# Patient Record
Sex: Female | Born: 1965 | Race: White | Hispanic: No | Marital: Married | Smoking: Current some day smoker
Health system: Southern US, Community
[De-identification: ages and names within clinical notes are randomized; demographics above are authoritative.]

## PROBLEM LIST (undated history)

## (undated) DIAGNOSIS — E785 Hyperlipidemia, unspecified: Secondary | ICD-10-CM

## (undated) DIAGNOSIS — I10 Essential (primary) hypertension: Secondary | ICD-10-CM

## (undated) DIAGNOSIS — K219 Gastro-esophageal reflux disease without esophagitis: Secondary | ICD-10-CM

## (undated) HISTORY — PX: DENTAL SURGERY: SHX609

## (undated) HISTORY — DX: Gastro-esophageal reflux disease without esophagitis: K21.9

## (undated) HISTORY — DX: Hyperlipidemia, unspecified: E78.5

## (undated) HISTORY — DX: Essential (primary) hypertension: I10

---

## 1997-08-16 ENCOUNTER — Encounter: Admission: RE | Admit: 1997-08-16 | Discharge: 1997-09-27 | Payer: Self-pay | Admitting: Obstetrics and Gynecology

## 1999-07-11 ENCOUNTER — Other Ambulatory Visit: Admission: RE | Admit: 1999-07-11 | Discharge: 1999-07-11 | Payer: Self-pay | Admitting: Obstetrics & Gynecology

## 2003-07-03 ENCOUNTER — Emergency Department (HOSPITAL_COMMUNITY): Admission: EM | Admit: 2003-07-03 | Discharge: 2003-07-03 | Payer: Self-pay

## 2005-05-14 ENCOUNTER — Ambulatory Visit: Payer: Self-pay | Admitting: Family Medicine

## 2005-05-14 ENCOUNTER — Other Ambulatory Visit: Admission: RE | Admit: 2005-05-14 | Discharge: 2005-05-14 | Payer: Self-pay | Admitting: Family Medicine

## 2014-12-16 ENCOUNTER — Encounter (INDEPENDENT_AMBULATORY_CARE_PROVIDER_SITE_OTHER): Payer: Self-pay

## 2014-12-16 ENCOUNTER — Ambulatory Visit (INDEPENDENT_AMBULATORY_CARE_PROVIDER_SITE_OTHER): Payer: BLUE CROSS/BLUE SHIELD | Admitting: Primary Care

## 2014-12-16 ENCOUNTER — Encounter: Payer: Self-pay | Admitting: Primary Care

## 2014-12-16 VITALS — BP 146/94 | HR 106 | Temp 98.3°F | Ht 65.75 in | Wt 161.1 lb

## 2014-12-16 DIAGNOSIS — I10 Essential (primary) hypertension: Secondary | ICD-10-CM

## 2014-12-16 DIAGNOSIS — Z720 Tobacco use: Secondary | ICD-10-CM

## 2014-12-16 MED ORDER — VARENICLINE TARTRATE 1 MG PO TABS: 1.0000 mg | ORAL_TABLET | Freq: Two times a day (BID) | ORAL | Status: DC

## 2014-12-16 MED ORDER — HYDROCHLOROTHIAZIDE 12.5 MG PO TABS: 12.5000 mg | ORAL_TABLET | Freq: Every day | ORAL | Status: DC

## 2014-12-16 MED ORDER — VARENICLINE TARTRATE 0.5 MG X 11 & 1 MG X 42 PO MISC: ORAL | Status: DC

## 2014-12-16 NOTE — Assessment & Plan Note (Signed)
Smoked 1/2-1 PPD x 26 years. She is ready to quit and is interested in Chantix. She's tried the gum and patches without success. Start Chantix today. Provided starting and continuing pack.  Precautions provided to patient regarding side effects. Follow up in 2 weeks.

## 2014-12-16 NOTE — Patient Instructions (Signed)
Start Chantix Starting Pack first. Follow package directions. I have also provided you with a prescription for the continuing pack.  Start Hydrochlorothiazide tablets for blood pressure. Take 1 tablet by mouth daily. Check your blood pressure when possible and notify me if it is consistently above 140/90.  Follow up in 2 weeks for re-evaluation of your blood pressure.  Please schedule a physical with me in the next 1-2 months. You will also schedule a lab only appointment one week prior. We will discuss your lab results during your physical.  It was a pleasure to meet you today! Please don't hesitate to call me with any questions. Welcome to [facility]!   Hypertension Hypertension, commonly called high blood pressure, is when the force of blood pumping through your arteries is too strong. Your arteries are the blood vessels that carry blood from your heart throughout your body. A blood pressure reading consists of a higher number over a lower number, such as 110/72. The higher number (systolic) is the pressure inside your arteries when your heart pumps. The lower number (diastolic) is the pressure inside your arteries when your heart relaxes. Ideally you want your blood pressure below 120/80. Hypertension forces your heart to work harder to pump blood. Your arteries may become narrow or stiff. Having hypertension puts you at risk for heart disease, stroke, and other problems.  RISK FACTORS Some risk factors for high blood pressure are controllable. Others are not.  Risk factors you cannot control include:   Race. You may be at higher risk if you are African American.  Age. Risk increases with age.  Gender. Men are at higher risk than women before age 88 years. After age 33, women are at higher risk than men. Risk factors you can control include:  Not getting enough exercise or physical activity.  Being overweight.  Getting too much fat, sugar, calories, or salt in your diet.  Drinking  too much alcohol. SIGNS AND SYMPTOMS Hypertension does not usually cause signs or symptoms. Extremely high blood pressure (hypertensive crisis) may cause headache, anxiety, shortness of breath, and nosebleed. DIAGNOSIS  To check if you have hypertension, your health care provider will measure your blood pressure while you are seated, with your arm held at the level of your heart. It should be measured at least twice using the same arm. Certain conditions can cause a difference in blood pressure between your right and left arms. A blood pressure reading that is higher than normal on one occasion does not mean that you need treatment. If one blood pressure reading is high, ask your health care provider about having it checked again. TREATMENT  Treating high blood pressure includes making lifestyle changes and possibly taking medicine. Living a healthy lifestyle can help lower high blood pressure. You may need to change some of your habits. Lifestyle changes may include:  Following the DASH diet. This diet is high in fruits, vegetables, and whole grains. It is low in salt, red meat, and added sugars.  Getting at least 2 hours of brisk physical activity every week.  Losing weight if necessary.  Not smoking.  Limiting alcoholic beverages.  Learning ways to reduce stress. If lifestyle changes are not enough to get your blood pressure under control, your health care provider may prescribe medicine. You may need to take more than one. Work closely with your health care provider to understand the risks and benefits. HOME CARE INSTRUCTIONS  Have your blood pressure rechecked as directed by your health care provider.  Take medicines only as directed by your health care provider. Follow the directions carefully. Blood pressure medicines must be taken as prescribed. The medicine does not work as well when you skip doses. Skipping doses also puts you at risk for problems.   Do not smoke.    Monitor your blood pressure at home as directed by your health care provider. SEEK MEDICAL CARE IF:   You think you are having a reaction to medicines taken.  You have recurrent headaches or feel dizzy.  You have swelling in your ankles.  You have trouble with your vision. SEEK IMMEDIATE MEDICAL CARE IF:  You develop a severe headache or confusion.  You have unusual weakness, numbness, or feel faint.  You have severe chest or abdominal pain.  You vomit repeatedly.  You have trouble breathing. MAKE SURE YOU:   Understand these instructions.  Will watch your condition.  Will get help right away if you are not doing well or get worse. Document Released: 07/02/2005 Document Revised: 11/16/2013 Document Reviewed: 04/24/2013 [facility] Patient Information 2015 [place], [place]. This information is not intended to replace advice given to you by your health care provider. Make sure you discuss any questions you have with your health care provider.

## 2014-12-16 NOTE — Assessment & Plan Note (Signed)
Numerous elevated readings at [facility] and various drug stores. She is worried and would like to be placed on medication. Due to historical elevated readings will start medication. Start HCTZ 12.5 mg daily. Discussed the importance of healthy diet and exercise. Follow up in 2 weeks.

## 2014-12-16 NOTE — Progress Notes (Signed)
   Subjective:    Patient ID: Maureen Orozco, female    DOB: 28-Dec-1965, 49 y.o.   MRN: [id number]  HPI  Maureen Orozco is a 49 year old female who presents today to establish care and discuss the problems mentioned below. She has not seen a PCP in over 10 years.  1) Elevated blood pressure readings: Elevated in clinic today, reports elevated readings in the past and last one being 2 weeks ago which was 150's/90's at [facility]. She reports occasional headaches. Denies chest pain, dizziness.  2) Hyperlipidemia: Diagnosed several years ago. She has not been on medication before and tries to manage with a healthy diet. Her cholesterol has not been checked in over 10 years. Diet consists of sandwiches, cereal, meats, vegetables, fruit. She drinks sodas, some sweet tea, rarely water.   3) Tobacco abuse: Smokes cigarettes since age 6. She currently smokes 1/2 PPD. She's tried quitting in the past with the nicotine patches and gum without success. She is ready to quit and is interested in medication.   Review of Systems  Constitutional: Negative for fatigue and unexpected weight change.  HENT: Negative for rhinorrhea.   Respiratory: Negative for cough and shortness of breath.   Cardiovascular: Negative for chest pain.  Gastrointestinal: Negative for diarrhea and constipation.  Genitourinary: Negative for dysuria and frequency.  Musculoskeletal: Negative for myalgias and arthralgias.  Skin: Negative for rash.  Neurological: Negative for dizziness.       Occasional headaches  Psychiatric/Behavioral:       Denies concerns for anxiety and depression       Past Medical History  Diagnosis Date  . Hyperlipidemia     History   Social History  . Marital Status: Married    Spouse Name: N/A  . Number of Children: N/A  . Years of Education: N/A   Occupational History  . Not on file.   Social History Main Topics  . Smoking status: Current Some Day Smoker -- 0.50 packs/day    Types:  Cigarettes  . Smokeless tobacco: Not on file  . Alcohol Use: No  . Drug Use: Not on file  . Sexual Activity: Not on file   Other Topics Concern  . Not on file   Social History Narrative   Married.   Works at [employer] in [place].   Has one child.   Enjoys playing computer games, watching TV.    History reviewed. No pertinent past surgical history.  Family History  Problem Relation Age of Onset  . Adopted: Yes    No Known Allergies  No current outpatient prescriptions on file prior to visit.   No current facility-administered medications on file prior to visit.    BP 146/94 mmHg  Pulse 106  Temp(Src) 98.3 F (36.8 C) (Oral)  Ht 5' 5.75" (1.67 m)  Wt 161 lb 1.9 oz (73.084 kg)  BMI 26.21 kg/m2  SpO2 98%    Objective:   Physical Exam  Constitutional: She is oriented to person, place, and time. She appears well-nourished.  HENT:  Head: Normocephalic.  Cardiovascular: Normal rate and regular rhythm.   Pulmonary/Chest: Effort normal and breath sounds normal.  Musculoskeletal: Normal range of motion.  Neurological: She is alert and oriented to person, place, and time.  Skin: Skin is warm and dry.  Psychiatric: She has a normal mood and affect.          Assessment & Plan:

## 2014-12-16 NOTE — Progress Notes (Signed)
Pre visit review using our clinic review tool, if applicable. No additional management support is needed unless otherwise documented below in the visit note. 

## 2015-01-04 ENCOUNTER — Ambulatory Visit (INDEPENDENT_AMBULATORY_CARE_PROVIDER_SITE_OTHER): Payer: BLUE CROSS/BLUE SHIELD | Admitting: Primary Care

## 2015-01-04 ENCOUNTER — Encounter: Payer: Self-pay | Admitting: Primary Care

## 2015-01-04 VITALS — BP 138/84 | HR 119 | Temp 98.6°F | Ht 66.0 in | Wt 169.0 lb

## 2015-01-04 DIAGNOSIS — I10 Essential (primary) hypertension: Secondary | ICD-10-CM | POA: Diagnosis not present

## 2015-01-04 DIAGNOSIS — Z720 Tobacco use: Secondary | ICD-10-CM | POA: Diagnosis not present

## 2015-01-04 MED ORDER — BUPROPION HCL ER (SR) 150 MG PO TB12: 150.0000 mg | ORAL_TABLET | Freq: Two times a day (BID) | ORAL | Status: DC

## 2015-01-04 NOTE — Assessment & Plan Note (Signed)
Chantix too expensive, although she is still motivated to quit. RX for Bupropion SR tablets. 1 daily x 3 days then advance to 1 BID. Discussed importance of choosing a quit date one week after starting medication. Follow up in 4 weeks.

## 2015-01-04 NOTE — Patient Instructions (Signed)
Continue hydrochlorothiazide tablets. Continue checking your blood pressure at home.  Start Bupropion SR tablets. Take 1 tablet by mouth daily for 3 days, then take 1 tablet by mouth twice daily. You must choose a quit date one week before starting medication. At that point you will need to throw away all cigarettes.  Follow up in 4 weeks for re-evaluation. It was very nice to see you!

## 2015-01-04 NOTE — Progress Notes (Signed)
Pre visit review using our clinic review tool, if applicable. No additional management support is needed unless otherwise documented below in the visit note. 

## 2015-01-04 NOTE — Assessment & Plan Note (Signed)
Improved on HCTZ.  Home readings are 120-130/80. Stable today, continue HCTZ.

## 2015-01-04 NOTE — Progress Notes (Signed)
Subjective:    Patient ID: Maureen Orozco, female    DOB: [birth year], 49 y.o.   MRN: [id number]  HPI  Maureen Orozco is a 49 year old female who presents today for follow up.  1) Hypertension: She was evaluated as a new patient on 6/2 and was initiated on HCTZ for numerous elevated readings of high blood pressure, including the elevated reading in the office. She's been checking her blood pressure at home and has been getting 120-130's/80's on average. Her BP today is stable. Denies chest pain, headaches, dizziness.  2) Tobacco cessation: She was very motivated to quit smoking last visit and was interested in Chantix. Prescriptions were provided for starting and continuing packs. She was unable to afford the medication and continues to be very motivated to quit. She is still smoking cigarettes.  3) Leg pain: Present to left leg for the past 2 weeks. She denies recent injury but believes she's pulled a muscle. Denies numbness/tingling. She's tried taking ibuprofen and tylenol without help.   Review of Systems  Respiratory: Negative for shortness of breath.   Cardiovascular: Negative for chest pain.  Musculoskeletal: Positive for myalgias.  Neurological: Negative for dizziness and headaches.       Past Medical History  Diagnosis Date  . Hyperlipidemia     History   Social History  . Marital Status: Married    Spouse Name: N/A  . Number of Children: N/A  . Years of Education: N/A   Occupational History  . Not on file.   Social History Main Topics  . Smoking status: Current Some Day Smoker -- 0.50 packs/day    Types: Cigarettes  . Smokeless tobacco: Not on file  . Alcohol Use: No  . Drug Use: Not on file  . Sexual Activity: Not on file   Other Topics Concern  . Not on file   Social History Narrative   Married.   Works at [employer] in [place].   Has one child.   Enjoys playing computer games, watching TV.    No past surgical history on file.  Family History    Problem Relation Age of Onset  . Adopted: Yes    No Known Allergies  Current Outpatient Prescriptions on File Prior to Visit  Medication Sig Dispense Refill  . hydrochlorothiazide (HYDRODIURIL) 12.5 MG tablet Take 1 tablet (12.5 mg total) by mouth daily. 30 tablet 2  . varenicline (CHANTIX CONTINUING MONTH PAK) 1 MG tablet Take 1 tablet (1 mg total) by mouth 2 (two) times daily. (Patient not taking: Reported on 01/04/2015) 60 tablet 0  . varenicline (CHANTIX STARTING MONTH PAK) 0.5 MG X 11 & 1 MG X 42 tablet Take one 0.5 mg tablet by mouth once daily for 3 days, then increase to one 0.5 mg tablet twice daily for 4 days, then increase to one 1 mg tablet twice daily. (Patient not taking: Reported on 01/04/2015) 53 tablet 0   No current facility-administered medications on file prior to visit.    BP 138/84 mmHg  Pulse 119  Temp(Src) 98.6 F (37 C) (Oral)  Ht 5\' 6"  (1.676 m)  Wt 169 lb (76.658 kg)  BMI 27.29 kg/m2  SpO2 97%    Objective:   Physical Exam  Constitutional: She appears well-nourished.  Cardiovascular: Normal rate and regular rhythm.   Pulmonary/Chest: Effort normal and breath sounds normal.  Musculoskeletal: Normal range of motion. She exhibits tenderness.  Skin: Skin is warm and dry.  Assessment & Plan:

## 2015-01-28 ENCOUNTER — Other Ambulatory Visit: Payer: Self-pay | Admitting: Internal Medicine

## 2015-01-28 DIAGNOSIS — Z0000 Encounter for general adult medical examination without abnormal findings: Secondary | ICD-10-CM

## 2015-02-08 ENCOUNTER — Other Ambulatory Visit (INDEPENDENT_AMBULATORY_CARE_PROVIDER_SITE_OTHER): Payer: BLUE CROSS/BLUE SHIELD

## 2015-02-08 DIAGNOSIS — Z0000 Encounter for general adult medical examination without abnormal findings: Secondary | ICD-10-CM

## 2015-02-08 LAB — COMPREHENSIVE METABOLIC PANEL
ALT: 9 U/L (ref 0–35)
AST: 11 U/L (ref 0–37)
Albumin: 4.3 g/dL (ref 3.5–5.2)
Alkaline Phosphatase: 64 U/L (ref 39–117)
BUN: 17 mg/dL (ref 6–23)
CHLORIDE: 105 meq/L (ref 96–112)
CO2: 27 mEq/L (ref 19–32)
Calcium: 9.6 mg/dL (ref 8.4–10.5)
Creatinine, Ser: 0.76 mg/dL (ref 0.40–1.20)
GFR: 86.77 mL/min (ref >60.00)
GLUCOSE: 94 mg/dL (ref 70–99)
Potassium: 3.7 mEq/L (ref 3.5–5.1)
Sodium: 140 mEq/L (ref 135–145)
TOTAL PROTEIN: 7.2 g/dL (ref 6.0–8.3)
Total Bilirubin: 0.5 mg/dL (ref 0.2–1.2)

## 2015-02-08 LAB — LIPID PANEL
Cholesterol: 262 mg/dL — ABNORMAL HIGH (ref 0–200)
HDL: 53.7 mg/dL (ref >39.00)
LDL Cholesterol: 186 mg/dL — ABNORMAL HIGH (ref 0–99)
NonHDL: 208.3
Total CHOL/HDL Ratio: 5
Triglycerides: 110 mg/dL (ref 0.0–149.0)
VLDL: 22 mg/dL (ref 0.0–40.0)

## 2015-02-08 LAB — CBC
HEMATOCRIT: 41.7 % (ref 36.0–46.0)
HEMOGLOBIN: 14.1 g/dL (ref 12.0–15.0)
MCHC: 33.9 g/dL (ref 30.0–36.0)
MCV: 97.6 fl (ref 78.0–100.0)
Platelets: 207 10*3/uL (ref 150.0–400.0)
RBC: 4.27 Mil/uL (ref 3.87–5.11)
RDW: 14.3 % (ref 11.5–15.5)
WBC: 5.5 10*3/uL (ref 4.0–10.5)

## 2015-02-11 ENCOUNTER — Encounter: Payer: Self-pay | Admitting: Primary Care

## 2015-02-11 ENCOUNTER — Other Ambulatory Visit (HOSPITAL_COMMUNITY)
Admission: RE | Admit: 2015-02-11 | Discharge: 2015-02-11 | Disposition: A | Discharge status: Home / Self Care | Payer: BLUE CROSS/BLUE SHIELD | Source: Ambulatory Visit | Attending: Primary Care | Admitting: Primary Care

## 2015-02-11 ENCOUNTER — Ambulatory Visit (INDEPENDENT_AMBULATORY_CARE_PROVIDER_SITE_OTHER): Payer: BLUE CROSS/BLUE SHIELD | Admitting: Primary Care

## 2015-02-11 VITALS — BP 152/92 | HR 90 | Temp 98.4°F | Ht 66.0 in | Wt 163.0 lb

## 2015-02-11 DIAGNOSIS — E785 Hyperlipidemia, unspecified: Secondary | ICD-10-CM

## 2015-02-11 DIAGNOSIS — Z720 Tobacco use: Secondary | ICD-10-CM

## 2015-02-11 DIAGNOSIS — Z1151 Encounter for screening for human papillomavirus (HPV): Secondary | ICD-10-CM | POA: Diagnosis present

## 2015-02-11 DIAGNOSIS — I10 Essential (primary) hypertension: Secondary | ICD-10-CM | POA: Diagnosis not present

## 2015-02-11 DIAGNOSIS — Z0000 Encounter for general adult medical examination without abnormal findings: Secondary | ICD-10-CM | POA: Diagnosis not present

## 2015-02-11 DIAGNOSIS — Z0001 Encounter for general adult medical examination with abnormal findings: Secondary | ICD-10-CM | POA: Insufficient documentation

## 2015-02-11 DIAGNOSIS — Z124 Encounter for screening for malignant neoplasm of cervix: Secondary | ICD-10-CM | POA: Diagnosis not present

## 2015-02-11 DIAGNOSIS — Z01419 Encounter for gynecological examination (general) (routine) without abnormal findings: Secondary | ICD-10-CM | POA: Insufficient documentation

## 2015-02-11 MED ORDER — ATORVASTATIN CALCIUM 20 MG PO TABS: 20.0000 mg | ORAL_TABLET | Freq: Every day | ORAL | Status: DC

## 2015-02-11 MED ORDER — HYDROCHLOROTHIAZIDE 25 MG PO TABS: 25.0000 mg | ORAL_TABLET | Freq: Every day | ORAL | Status: DC

## 2015-02-11 NOTE — Progress Notes (Signed)
Pre visit review using our clinic review tool, if applicable. No additional management support is needed unless otherwise documented below in the visit note. 

## 2015-02-11 NOTE — Assessment & Plan Note (Signed)
Elevated in clinic today, even on recheck. Elevated diastolic numbers at home. Increase HCTZ to 25 mg. New RX sent.

## 2015-02-11 NOTE — Assessment & Plan Note (Signed)
Tetanus due, forgot today, will administer at next visit. Pap and breast exam completed today. Labs mostly unremarkable, except cholesterol. Will treat. Discussed importance of healthy diet and exercise.

## 2015-02-11 NOTE — Patient Instructions (Addendum)
Start Lipitor (atorvastatin) 20 mg tablets for elevated cholesterol. Take 1 tablet by mouth at bedtime.  We will increase your hydrochlorothiazide to 25 mg. You may take 2 of the 12.5 mg tablets until you've completed your bottle. Pick yo the 25 mg dose at the pharmacy and take 1 tablet by mouth daily.  Follow up in 6 weeks for re-evaluation of your blood pressure. Please call me if you get any numbers below 100/60.  It was nice to see you!

## 2015-02-11 NOTE — Progress Notes (Signed)
Subjective:    Patient ID: Maureen Orozco, female    DOB: April 23, 1966, 49 y.o.   MRN: [id number]  HPI  Maureen Orozco is a 49 year old female who presents today for complete physical.  Immunizations: -Tetanus: Unsure.  -Influenza: Did not receive last season.   Diet: Unhealthy Breakfast: Skips Snacks: Crackers Lunch: Hot dog, pizza Dinner: Restaurants, fast food (steaks, potatoes, chicken) occasional vegetables Beverages: Mostly coke, sometimes water Exercise: She is not currently exercising. Eye exam: Not completed recently. No changes in vision. Dental exam: Has not been completed recently Pap Smear: Due today.  1) Tobacco cessation: Managed on Buproprion SR tablets as the Chantix was too expensive. Reports that the medication does help. She quit for 2 days and then started again, but plans to quit again Monday.  2) Essential hypertension: Managed on HCTZ 12.5 mg. BP readings were normal last visit, but borderline. BP today elevated. She has been checking her blood pressure at home and is getting 120's/80-90's. Recheck in clinic was 144/88. Denies chest pain, headaches, shortness of breath.  BP Readings from Last 3 Encounters:  02/11/15 152/92  01/04/15 138/84  12/16/14 146/94     Review of Systems  HENT: Negative for rhinorrhea.   Respiratory: Negative for cough and shortness of breath.   Cardiovascular: Negative for chest pain.  Gastrointestinal: Negative for diarrhea and constipation.  Genitourinary: Negative for difficulty urinating.  Musculoskeletal: Negative for myalgias and arthralgias.  Skin: Negative for rash.  Neurological: Negative for dizziness and headaches.  Psychiatric/Behavioral:       Denies concerns for anxiety or depression       Past Medical History  Diagnosis Date  . Hyperlipidemia     History   Social History  . Marital Status: Married    Spouse Name: N/A  . Number of Children: N/A  . Years of Education: N/A   Occupational History    . Not on file.   Social History Main Topics  . Smoking status: Current Some Day Smoker -- 0.50 packs/day    Types: Cigarettes  . Smokeless tobacco: Not on file  . Alcohol Use: No  . Drug Use: Not on file  . Sexual Activity: Not on file   Other Topics Concern  . Not on file   Social History Narrative   Married.   Works at [employer] in [place].   Has one child.   Enjoys playing computer games, watching TV.    No past surgical history on file.  Family History  Problem Relation Age of Onset  . Adopted: Yes    No Known Allergies  Current Outpatient Prescriptions on File Prior to Visit  Medication Sig Dispense Refill  . buPROPion (WELLBUTRIN SR) 150 MG 12 hr tablet Take 1 tablet (150 mg total) by mouth 2 (two) times daily. 60 tablet 1   No current facility-administered medications on file prior to visit.    BP 152/92 mmHg  Pulse 90  Temp(Src) 98.4 F (36.9 C) (Oral)  Ht 5\' 6"  (1.676 m)  Wt 163 lb (73.936 kg)  BMI 26.32 kg/m2  SpO2 98%    Objective:   Physical Exam  Constitutional: She is oriented to person, place, and time. She appears well-nourished.  HENT:  Right Ear: Tympanic membrane and ear canal normal.  Left Ear: Tympanic membrane and ear canal normal.  Eyes: Conjunctivae and EOM are normal. Pupils are equal, round, and reactive to light.  Neck: Neck supple. No thyromegaly present.  Cardiovascular: Normal rate and  regular rhythm.   Pulmonary/Chest: Effort normal and breath sounds normal. Right breast exhibits no mass, no skin change and no tenderness. Left breast exhibits no mass, no skin change and no tenderness.  Abdominal: Soft. Bowel sounds are normal. There is no tenderness.  Genitourinary: Vagina normal. Cervix exhibits no discharge. Right adnexum displays no tenderness. Left adnexum displays no tenderness.  Musculoskeletal: Normal range of motion.  Lymphadenopathy:    She has no cervical adenopathy.  Neurological: She is alert and oriented  to person, place, and time. She has normal reflexes. No cranial nerve deficit.  Skin: Skin is warm and dry.  Psychiatric: She has a normal mood and affect.          Assessment & Plan:

## 2015-02-11 NOTE — Assessment & Plan Note (Signed)
Maintained on buproprion SR 150. This does help to reduce cravings. She plans on quitting next week.

## 2015-02-11 NOTE — Assessment & Plan Note (Signed)
Total of 262 with LDL of 186. Poor diet. She does not wish to change her diet. Will start lipitor 20 mg daily. LFT's WNL. Repeat lipids in 3-6 months.

## 2015-02-15 ENCOUNTER — Encounter: Payer: Self-pay | Admitting: *Deleted

## 2015-02-15 LAB — CYTOLOGY - PAP

## 2015-03-25 ENCOUNTER — Ambulatory Visit: Payer: BLUE CROSS/BLUE SHIELD | Admitting: Primary Care

## 2015-03-30 ENCOUNTER — Encounter: Payer: Self-pay | Admitting: Primary Care

## 2015-03-30 ENCOUNTER — Ambulatory Visit (INDEPENDENT_AMBULATORY_CARE_PROVIDER_SITE_OTHER): Payer: BLUE CROSS/BLUE SHIELD | Admitting: Primary Care

## 2015-03-30 VITALS — BP 142/94 | HR 101 | Temp 97.7°F | Ht 66.0 in | Wt 171.4 lb

## 2015-03-30 DIAGNOSIS — I10 Essential (primary) hypertension: Secondary | ICD-10-CM

## 2015-03-30 DIAGNOSIS — Z720 Tobacco use: Secondary | ICD-10-CM

## 2015-03-30 DIAGNOSIS — Z23 Encounter for immunization: Secondary | ICD-10-CM

## 2015-03-30 NOTE — Assessment & Plan Note (Signed)
Continues to smoke 1/2 PPD. Managed on buproprion 150 BID. Discussed increasing dose of medication, she declines today and will work to quit smoking with current dose. Will continue to monitor.

## 2015-03-30 NOTE — Patient Instructions (Addendum)
Start taking your blood pressure medication (hydrochlorothiazide) everyday. It's very important to take this every day because untreated high blood pressure can cause many problems with your health.  Take your blood pressure everyday for the next 2 weeks. Call me in 2 weeks with your readings.   Continue to work on cutting back on smoking. Please let me know if you would like to try an increase in your medication.   Follow up in 3 months for re-evaluation of blood pressure and tobacco abuse.  It was a pleasure to see you today!

## 2015-03-30 NOTE — Progress Notes (Signed)
Subjective:    Patient ID: Maureen Orozco, female    DOB: [birth year], 49 y.o.   MRN: [id number]  HPI  Maureen Orozco is a 49 year old female who presents today for follow up of hypertension. She is managed on HCTZ which was increased to 25 mg last visit. She has not been taking her blood pressure medication regularly. She has been taking her medication twice weekly on average since her last visit. She is checking her BP at home and numbers vary but are typically 140-150/90. Denies chest pain, shortness of breath. Some headaches.  BP Readings from Last 3 Encounters:  03/30/15 142/94  02/11/15 152/92  01/04/15 138/84   2) Back pain: Present to middle of lower back and left lateral chest wall since falling when attempting to climb in bed 4 weeks ago. She's been taking 800 mg once daily without relief. Overall her pain has improved.  3) Tobacco abuse: Smokes 1/2 PPD of cigarettes and endorses a decrease in the amount she typically smokes. She is currently managed on bupropion SR twice daily. She is ready to quit and believes that the medication is working to reduce cravings overall.   Review of Systems  Eyes: Negative for visual disturbance.  Respiratory: Negative for shortness of breath.   Cardiovascular: Negative for chest pain.  Musculoskeletal: Positive for back pain.  Neurological: Positive for headaches. Negative for dizziness.       Past Medical History  Diagnosis Date  . Hyperlipidemia     Social History   Social History  . Marital Status: Married    Spouse Name: N/A  . Number of Children: N/A  . Years of Education: N/A   Occupational History  . Not on file.   Social History Main Topics  . Smoking status: Current Some Day Smoker -- 0.50 packs/day    Types: Cigarettes  . Smokeless tobacco: Not on file  . Alcohol Use: No  . Drug Use: Not on file  . Sexual Activity: Not on file   Other Topics Concern  . Not on file   Social History Narrative   Married.   Works  at [employer] in [place].   Has one child.   Enjoys playing computer games, watching TV.    No past surgical history on file.  Family History  Problem Relation Age of Onset  . Adopted: Yes    No Known Allergies  Current Outpatient Prescriptions on File Prior to Visit  Medication Sig Dispense Refill  . atorvastatin (LIPITOR) 20 MG tablet Take 1 tablet (20 mg total) by mouth daily. 30 tablet 5  . buPROPion (WELLBUTRIN SR) 150 MG 12 hr tablet Take 1 tablet (150 mg total) by mouth 2 (two) times daily. 60 tablet 1  . hydrochlorothiazide (HYDRODIURIL) 25 MG tablet Take 1 tablet (25 mg total) by mouth daily. (Patient not taking: Reported on 03/30/2015) 30 tablet 5   No current facility-administered medications on file prior to visit.    BP 142/94 mmHg  Pulse 101  Temp(Src) 97.7 F (36.5 C) (Oral)  Ht 5\' 6"  (1.676 m)  Wt 171 lb 6.4 oz (77.747 kg)  BMI 27.68 kg/m2  SpO2 97%    Objective:   Physical Exam  Constitutional: She appears well-nourished.  Cardiovascular: Normal rate and regular rhythm.   Pulmonary/Chest: Effort normal and breath sounds normal.  Musculoskeletal: Normal range of motion. She exhibits tenderness. She exhibits no edema.  Tenderness to left lateral chest wall, very light bruise present at T6.  Skin: Skin is warm and dry.  Psychiatric: She has a normal mood and affect.          Assessment & Plan:

## 2015-03-30 NOTE — Progress Notes (Signed)
Pre visit review using our clinic review tool, if applicable. No additional management support is needed unless otherwise documented below in the visit note. 

## 2015-03-30 NOTE — Addendum Note (Signed)
Addended by: Jacqualin Combes on: 03/30/2015 03:37 PM   Modules accepted: Orders

## 2015-03-30 NOTE — Assessment & Plan Note (Signed)
Above goal today. She's been taking her medication twice weekly on average. Some headaches. Denies chest pain, SOB. Spent a long time discussing the long term effects of HTN and the importance of adherence.  She is to check her BP for 2 weeks and send me the readings. If she's consistently taking her medication, and if readings are still elevated, will consider adding medication.  Follow up in 1 month.

## 2015-04-13 ENCOUNTER — Telehealth: Payer: Self-pay | Admitting: Primary Care

## 2015-04-13 NOTE — Telephone Encounter (Signed)
Message left for patient to return my call.  

## 2015-04-13 NOTE — Telephone Encounter (Signed)
Will you please have Maureen Orozco send me her BP readings once they purchase a new cuff? Thanks.

## 2015-04-14 ENCOUNTER — Telehealth: Payer: Self-pay | Admitting: Primary Care

## 2015-04-14 NOTE — Telephone Encounter (Signed)
Patient called back. Notified patient of Kate's comments. Patient also stated if it is ok with Anda Kraft, can she increased the dosage of bupropion.

## 2015-04-14 NOTE — Telephone Encounter (Signed)
Please apologize for me but she is already at the max dose. I told her that we could consider increasing the dose, but I didn't realize she was already at max dose.

## 2015-04-15 NOTE — Telephone Encounter (Signed)
Called and notified patient of Kate's comments. Patient verbalized understanding.  

## 2015-04-27 ENCOUNTER — Telehealth: Payer: Self-pay | Admitting: Primary Care

## 2015-04-27 NOTE — Telephone Encounter (Signed)
Will you please call and check on Maureen Orozco's blood pressure. Is she taking her medication daily? Thanks.

## 2015-04-27 NOTE — Telephone Encounter (Signed)
Called patient and schedule follow up apt on 05/19/2015.

## 2015-04-27 NOTE — Telephone Encounter (Signed)
We will need to bring her into the office within the next 2 weeks for recheck of her BP, will you please schedule? Thanks.

## 2015-04-27 NOTE — Telephone Encounter (Signed)
Called patient and she stated that her blood pressure is running about the same. She is taking her medication and that's all she would say.

## 2015-05-19 ENCOUNTER — Ambulatory Visit (INDEPENDENT_AMBULATORY_CARE_PROVIDER_SITE_OTHER): Payer: BLUE CROSS/BLUE SHIELD | Admitting: Primary Care

## 2015-05-19 ENCOUNTER — Encounter: Payer: Self-pay | Admitting: Primary Care

## 2015-05-19 VITALS — BP 150/92 | HR 118 | Temp 98.0°F | Ht 66.0 in | Wt 173.0 lb

## 2015-05-19 DIAGNOSIS — M5442 Lumbago with sciatica, left side: Secondary | ICD-10-CM

## 2015-05-19 DIAGNOSIS — I10 Essential (primary) hypertension: Secondary | ICD-10-CM

## 2015-05-19 DIAGNOSIS — G8929 Other chronic pain: Secondary | ICD-10-CM | POA: Insufficient documentation

## 2015-05-19 MED ORDER — LISINOPRIL 5 MG PO TABS
5.0000 mg | ORAL_TABLET | Freq: Every day | ORAL | Status: DC
Start: 2015-05-19 — End: 2015-09-15

## 2015-05-19 NOTE — Progress Notes (Signed)
Subjective:    Patient ID: Maureen Orozco, female    DOB: 03/11/1966, 49 y.o.   MRN: [id number]  HPI  Maureen Orozco is a 49 year old female who presents today for follow up of hypertension. She's been evaluated multiple times for hypertension in the past and was non compliant to her medication several visits ago. She was initiated on HCTZ 25 mg earlier this summer.   Since her last visit her blood pressure is elevated in the clinic at 150/92 and 142/92 with recheck. She's been checking her BP at home and has been getting 120-130/80-90's. She's been taking her medication daily. She's been eating poorly over the past 2 weeks and has had difficulty controlling her diet.  BP Readings from Last 3 Encounters:  05/19/15 150/92  03/30/15 142/94  02/11/15 152/92     2) Lower extremity pain: Located to left lower extremity and has been present for the past 2 months. Her pain will be present during the day when walking and sometimes will wake her from sleep at night. Her pain will radiate down to her left lower extremity with symptoms of sharp, shooting pain. Occasional numbness/tingling. Her pain is relived with rest. She's been taking ibuprofen 800 mg daily with improvement.   Review of Systems  Respiratory: Negative for shortness of breath.   Cardiovascular: Negative for chest pain.  Musculoskeletal: Positive for arthralgias.  Neurological: Positive for numbness. Negative for dizziness and headaches.       Past Medical History  Diagnosis Date  . Hyperlipidemia     Social History   Social History  . Marital Status: Married    Spouse Name: N/A  . Number of Children: N/A  . Years of Education: N/A   Occupational History  . Not on file.   Social History Main Topics  . Smoking status: Current Some Day Smoker -- 0.50 packs/day    Types: Cigarettes  . Smokeless tobacco: Not on file  . Alcohol Use: No  . Drug Use: Not on file  . Sexual Activity: Not on file   Other Topics Concern    . Not on file   Social History Narrative   Married.   Works at [employer] in [place].   Has one child.   Enjoys playing computer games, watching TV.    No past surgical history on file.  Family History  Problem Relation Age of Onset  . Adopted: Yes    No Known Allergies  Current Outpatient Prescriptions on File Prior to Visit  Medication Sig Dispense Refill  . atorvastatin (LIPITOR) 20 MG tablet Take 1 tablet (20 mg total) by mouth daily. 30 tablet 5  . buPROPion (WELLBUTRIN SR) 150 MG 12 hr tablet Take 1 tablet (150 mg total) by mouth 2 (two) times daily. 60 tablet 1  . hydrochlorothiazide (HYDRODIURIL) 25 MG tablet Take 1 tablet (25 mg total) by mouth daily. 30 tablet 5   No current facility-administered medications on file prior to visit.    BP 150/92 mmHg  Pulse 118  Temp(Src) 98 F (36.7 C) (Oral)  Ht 5\' 6"  (1.676 m)  Wt 173 lb (78.472 kg)  BMI 27.94 kg/m2  SpO2 97%    Objective:   Physical Exam  Constitutional: She appears well-nourished.  Cardiovascular: Normal rate and regular rhythm.   Pulmonary/Chest: Effort normal and breath sounds normal.  Musculoskeletal:  Pain and numbness to left lower extremity with sitting straight leg raise. Pain to left lower extremity with laying straight leg raise.  No decrease in ROM.  Skin: Skin is warm and dry.          Assessment & Plan:

## 2015-05-19 NOTE — Assessment & Plan Note (Signed)
Above goal in clinic today, even on recheck. She's been taking her medication daily. Will add in low dose Lisinopril 5 mg daily. Follow up in 2 weeks for re-evaluation.  BMP next visit.

## 2015-05-19 NOTE — Progress Notes (Signed)
Pre visit review using our clinic review tool, if applicable. No additional management support is needed unless otherwise documented below in the visit note. 

## 2015-05-19 NOTE — Assessment & Plan Note (Signed)
Positive straight leg raise. Discussed supportive treatment. She declines xray today.  Will continue to monitor.

## 2015-05-19 NOTE — Patient Instructions (Addendum)
Your blood pressure is still too high, we will need to add a second medication.  Continue taking Hydrochlorothiazide 25 mg.  Start Lisinopril 5 mg tablets. Take 1 tablet by mouth every day.  Cancel the December appointment for now.  Follow up in 2 weeks for re-evaluation of blood pressure.  It was a pleasure to see you today!

## 2015-06-06 ENCOUNTER — Ambulatory Visit (INDEPENDENT_AMBULATORY_CARE_PROVIDER_SITE_OTHER): Payer: BLUE CROSS/BLUE SHIELD | Admitting: Primary Care

## 2015-06-06 ENCOUNTER — Encounter: Payer: Self-pay | Admitting: Primary Care

## 2015-06-06 VITALS — BP 130/82 | HR 128 | Temp 98.1°F | Ht 66.0 in | Wt 170.0 lb

## 2015-06-06 DIAGNOSIS — K047 Periapical abscess without sinus: Secondary | ICD-10-CM

## 2015-06-06 DIAGNOSIS — I10 Essential (primary) hypertension: Secondary | ICD-10-CM | POA: Diagnosis not present

## 2015-06-06 MED ORDER — AMOXICILLIN 500 MG PO CAPS: 500.0000 mg | ORAL_CAPSULE | Freq: Three times a day (TID) | ORAL | Status: DC

## 2015-06-06 MED ORDER — HYDROCODONE-ACETAMINOPHEN 5-325 MG PO TABS: 1.0000 | ORAL_TABLET | Freq: Three times a day (TID) | ORAL | Status: DC | PRN

## 2015-06-06 NOTE — Progress Notes (Signed)
Subjective:    Patient ID: Maureen Orozco, female    DOB: [birth year], 49 y.o.   MRN: [id number]  HPI  Maureen Orozco is a 49 year old female who presents today for follow up of hypertension. She's had an elevated BP reading over the last several visits despite treatment with HCTZ 25 mg. Lisinopril 5 mg was added last visit.  Since her last visit her blood pressure is improved. She's checking her blood pressure at home once daily and is getting 130's/80's. Denies chest pain, shortness of breath, dizziness.   2) Dental abscess: Located to left lower molar and has been present for one week. She noticed a "pop" yesterday with drainage and pain. She's been experiencing moderate pain for the past 4 days. She's been taking motrin without improvement. She denies fevers, but hasn't checked. She plans on scheduling an appointment with a dentist soon.   Review of Systems  Constitutional: Negative for fever.  HENT:       Dental pain and swelling to left jaw  Respiratory: Negative for shortness of breath.   Cardiovascular: Negative for chest pain.  Neurological: Negative for dizziness and headaches.       Past Medical History  Diagnosis Date  . Hyperlipidemia     Social History   Social History  . Marital Status: Married    Spouse Name: N/A  . Number of Children: N/A  . Years of Education: N/A   Occupational History  . Not on file.   Social History Main Topics  . Smoking status: Current Some Day Smoker -- 0.50 packs/day    Types: Cigarettes  . Smokeless tobacco: Not on file  . Alcohol Use: No  . Drug Use: Not on file  . Sexual Activity: Not on file   Other Topics Concern  . Not on file   Social History Narrative   Married.   Works at [employer] in [place].   Has one child.   Enjoys playing computer games, watching TV.    No past surgical history on file.  Family History  Problem Relation Age of Onset  . Adopted: Yes    No Known Allergies  Current Outpatient  Prescriptions on File Prior to Visit  Medication Sig Dispense Refill  . atorvastatin (LIPITOR) 20 MG tablet Take 1 tablet (20 mg total) by mouth daily. 30 tablet 5  . buPROPion (WELLBUTRIN SR) 150 MG 12 hr tablet Take 1 tablet (150 mg total) by mouth 2 (two) times daily. 60 tablet 1  . hydrochlorothiazide (HYDRODIURIL) 25 MG tablet Take 1 tablet (25 mg total) by mouth daily. 30 tablet 5  . lisinopril (PRINIVIL,ZESTRIL) 5 MG tablet Take 1 tablet (5 mg total) by mouth daily. 30 tablet 3   No current facility-administered medications on file prior to visit.    BP 130/82 mmHg  Pulse 128  Temp(Src) 98.1 F (36.7 C) (Oral)  Ht 5\' 6"  (1.676 m)  Wt 170 lb (77.111 kg)  BMI 27.45 kg/m2  SpO2 95%    Objective:   Physical Exam  Constitutional: She appears well-nourished.  HENT:  Dental abscess to left lower molar. Multiple missing/cracked teeth. Poor dental hygiene. Moderate erythema, tenderness, and swelling to left jaw.  Cardiovascular: Regular rhythm.   Sinus tachycardia at 115  Pulmonary/Chest: Effort normal and breath sounds normal.  Skin: Skin is warm and dry.          Assessment & Plan:  Dental Abscess:  Located to left lower molar. Present for 1 week  with expulsion of pus/drainage since yesterday. Exam with moderate swelling, tenderness, and some drainage. Treat with Amoxicillin 500 mg TID x 10 days. Rx for short term vicodin supply provided for severe pain. Highly encouraged her to follow up with a dentist. Follow up PRN.

## 2015-06-06 NOTE — Assessment & Plan Note (Signed)
Improved with addition of low dose lisinopril. Will have her continue to monitor at home and notify me if readings consistently >140/90. Follow up in 6 months.

## 2015-06-06 NOTE — Patient Instructions (Addendum)
Start Amoxicillin antibiotic. Take 1 tablet by mouth three times daily for 10 days.  You may take hydrocodone pain medication three times daily as needed for severe pain.  Please schedule an appointment with a dentist soon. Please notify me if no improvement.  Please have your cholesterol checked in January as scheduled.  Follow up in July for repeat physical or sooner if needed.  It was a pleasure to see you today!  Dental Abscess A dental abscess is a collection of pus in or around a tooth. CAUSES This condition is caused by a bacterial infection around the root of the tooth that involves the inner part of the tooth (pulp). It may result from:  Severe tooth decay.  Trauma to the tooth that allows bacteria to enter into the pulp, such as a broken or chipped tooth.  Severe gum disease around a tooth. SYMPTOMS Symptoms of this condition include:  Severe pain in and around the infected tooth.  Swelling and redness around the infected tooth, in the mouth, or in the face.  Tenderness.  Pus drainage.  Bad breath.  Bitter taste in the mouth.  Difficulty swallowing.  Difficulty opening the mouth.  Nausea.  Vomiting.  Chills.  Swollen neck glands.  Fever. DIAGNOSIS This condition is diagnosed with examination of the infected tooth. During the exam, your dentist may tap on the infected tooth. Your dentist will also ask about your medical and dental history and may order X-rays. TREATMENT This condition is treated by eliminating the infection. This may be done with:  Antibiotic medicine.  A root canal. This may be performed to save the tooth.  Pulling (extracting) the tooth. This may also involve draining the abscess. This is done if the tooth cannot be saved. HOME CARE INSTRUCTIONS  Take medicines only as directed by your dentist.  If you were prescribed antibiotic medicine, finish all of it even if you start to feel better.  Rinse your mouth (gargle) often  with salt water to relieve pain or swelling.  Do not drive or operate heavy machinery while taking pain medicine.  Do not apply heat to the outside of your mouth.  Keep all follow-up visits as directed by your dentist. This is important. SEEK MEDICAL CARE IF:  Your pain is worse and is not helped by medicine. SEEK IMMEDIATE MEDICAL CARE IF:  You have a fever or chills.  Your symptoms suddenly get worse.  You have a very bad headache.  You have problems breathing or swallowing.  You have trouble opening your mouth.  You have swelling in your neck or around your eye.   This information is not intended to replace advice given to you by your health care provider. Make sure you discuss any questions you have with your health care provider.   Document Released: 07/02/2005 Document Revised: 11/16/2014 Document Reviewed: 06/29/2014 Elsevier Interactive Patient Education [employer].

## 2015-06-06 NOTE — Progress Notes (Signed)
Pre visit review using our clinic review tool, if applicable. No additional management support is needed unless otherwise documented below in the visit note. 

## 2015-06-29 ENCOUNTER — Ambulatory Visit: Payer: BLUE CROSS/BLUE SHIELD | Admitting: Primary Care

## 2015-07-19 ENCOUNTER — Other Ambulatory Visit: Payer: Self-pay | Admitting: Primary Care

## 2015-07-19 DIAGNOSIS — E785 Hyperlipidemia, unspecified: Secondary | ICD-10-CM

## 2015-07-25 ENCOUNTER — Other Ambulatory Visit: Payer: BLUE CROSS/BLUE SHIELD

## 2015-07-29 ENCOUNTER — Other Ambulatory Visit (INDEPENDENT_AMBULATORY_CARE_PROVIDER_SITE_OTHER): Payer: BLUE CROSS/BLUE SHIELD

## 2015-07-29 DIAGNOSIS — E785 Hyperlipidemia, unspecified: Secondary | ICD-10-CM

## 2015-07-29 LAB — LIPID PANEL
CHOL/HDL RATIO: 4
Cholesterol: 177 mg/dL (ref 0–200)
HDL: 48.5 mg/dL (ref >39.00)
LDL Cholesterol: 115 mg/dL — ABNORMAL HIGH (ref 0–99)
NONHDL: 128.69
Triglycerides: 68 mg/dL (ref 0.0–149.0)
VLDL: 13.6 mg/dL (ref 0.0–40.0)

## 2015-08-01 ENCOUNTER — Other Ambulatory Visit: Payer: Self-pay | Admitting: Primary Care

## 2015-08-02 MED ORDER — HYDROCHLOROTHIAZIDE 25 MG PO TABS: 25.0000 mg | ORAL_TABLET | Freq: Every day | ORAL | Status: DC

## 2015-08-02 NOTE — Telephone Encounter (Signed)
Per Result note on 08/01/2015. Refill these Rx for patient.  Called and notified patient of Kate's comments from results note. Patient verbalized understanding.

## 2015-09-15 ENCOUNTER — Encounter: Payer: Self-pay | Admitting: Primary Care

## 2015-09-15 ENCOUNTER — Ambulatory Visit (INDEPENDENT_AMBULATORY_CARE_PROVIDER_SITE_OTHER): Payer: BLUE CROSS/BLUE SHIELD | Admitting: Primary Care

## 2015-09-15 VITALS — BP 168/98 | HR 92 | Temp 98.2°F | Ht 66.0 in | Wt 169.0 lb

## 2015-09-15 DIAGNOSIS — I10 Essential (primary) hypertension: Secondary | ICD-10-CM | POA: Diagnosis not present

## 2015-09-15 DIAGNOSIS — M5442 Lumbago with sciatica, left side: Secondary | ICD-10-CM

## 2015-09-15 LAB — BASIC METABOLIC PANEL
BUN: 15 mg/dL (ref 6–23)
CHLORIDE: 107 meq/L (ref 96–112)
CO2: 27 meq/L (ref 19–32)
Calcium: 9.7 mg/dL (ref 8.4–10.5)
Creatinine, Ser: 0.77 mg/dL (ref 0.40–1.20)
GFR: 85.25 mL/min (ref >60.00)
GLUCOSE: 112 mg/dL — AB (ref 70–99)
POTASSIUM: 3.4 meq/L — AB (ref 3.5–5.1)
SODIUM: 141 meq/L (ref 135–145)

## 2015-09-15 MED ORDER — LISINOPRIL-HYDROCHLOROTHIAZIDE 20-25 MG PO TABS: 1.0000 | ORAL_TABLET | Freq: Every day | ORAL | Status: DC

## 2015-09-15 NOTE — Assessment & Plan Note (Signed)
Above goal today. Elevated readings at home. Stop HCTZ 25, lisinopril 5. Replace with lisinopril/HCTZ combo 20/25 mg daily. She is to check BP at home and bring readings to follow up appointment in 3 weeks.

## 2015-09-15 NOTE — Assessment & Plan Note (Signed)
Continues and present over the past month. More bothersome with ambulation, improved with rest. Ibuprofen with temporary improvement.  Will suggest xray at next visit.

## 2015-09-15 NOTE — Patient Instructions (Signed)
Start lisinopril/HCTZ 20/25mg  tablets. This is a combination pill. Take 1 tablet by mouth daily.   Stop taking HCTZ 25 mg and Lisinopril 5 mg.  Complete lab work prior to leaving today. I will notify you of your results once received.   Try ibuprofen 600 mg three times daily as needed for left leg pain which is likely caused by irritation to the nerve called sciatica.  Please notify me if this persists.  Follow up in 3 weeks for re-evaluation of blood pressure.  It was a pleasure to see you today!

## 2015-09-15 NOTE — Progress Notes (Signed)
   Subjective:    Patient ID: Maureen Orozco, female    DOB: 05-May-1966, 50 y.o.   MRN: [id number]  HPI  Maureen Orozco is a 50 year old female who presents today with a chief complaint of hypertension. She is currently managed on HCTZ 25 mg and Lisinopril 5 mg. Her BP today is above goal at 168/98.   She's been checking her BP at home which has been running 140-160/90's over the past month. She denies recent stress/anxiety at home or work. She also reports headaches. Denies chest pain, shortness of breath, dizziness.   2) Lower Extremity Pain: Located to the left lower extremity and has been present intermittently for the past month. She describes her pain as throbbing and sore which is worse with ambulation and improved with rest. She also endorses numbness to her feet. Her pain originates to the left upper hip and radiates down to her feet. She notices her pain mostly at home when doing chores. 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 She has also noticed intermittent swelling to her ankles but has not noticed this in several weeks. Current smoker of cigarettes. Denies recent injury or trauma.   Review of Systems  Respiratory: Negative for cough and shortness of breath.   Cardiovascular: Positive for leg swelling. Negative for chest pain.  Neurological: Positive for headaches. Negative for dizziness.       Past Medical History  Diagnosis Date  . Hyperlipidemia     Social History   Social History  . Marital Status: Married    Spouse Name: N/A  . Number of Children: N/A  . Years of Education: N/A   Occupational History  . Not on file.   Social History Main Topics  . Smoking status: Current Every Day Smoker -- 0.50 packs/day    Types: Cigarettes  . Smokeless tobacco: Not on file  . Alcohol Use: No  . Drug Use: Not on file  . Sexual Activity: Not on file   Other Topics Concern  . Not on file   Social History Narrative   Married.   Works at [employer] in [place].   Has one child.   Enjoys playing computer games, watching TV.    No past surgical history on file.  Family History  Problem Relation Age of Onset  . Adopted: Yes    No Known Allergies  Current Outpatient Prescriptions on File Prior to Visit  Medication Sig Dispense Refill  . atorvastatin (LIPITOR) 20 MG tablet TAKE ONE TABLET BY MOUTH ONCE DAILY 90 tablet 2  . buPROPion (WELLBUTRIN SR) 150 MG 12 hr tablet Take 1 tablet (150 mg total) by mouth 2 (two) times daily. 60 tablet 1   No current facility-administered medications on file prior to visit.    BP 168/98 mmHg  Pulse 92  Temp(Src) 98.2 F (36.8 C) (Oral)  Ht 5\' 6"  (1.676 m)  Wt 169 lb (76.658 kg)  BMI 27.29 kg/m2  SpO2 98%    Objective:   Physical Exam  Constitutional: She appears well-nourished.  Cardiovascular: Normal rate and regular rhythm.   Pulmonary/Chest: Effort normal and breath sounds normal.  Musculoskeletal: Normal range of motion.  No pain to calf upon plantar and dorsal flexion.   Skin: Skin is warm and dry.          Assessment & Plan:

## 2015-09-15 NOTE — Progress Notes (Signed)
Pre visit review using our clinic review tool, if applicable. No additional management support is needed unless otherwise documented below in the visit note. 

## 2015-09-16 ENCOUNTER — Encounter: Payer: Self-pay | Admitting: *Deleted

## 2015-10-06 ENCOUNTER — Ambulatory Visit (INDEPENDENT_AMBULATORY_CARE_PROVIDER_SITE_OTHER)
Admission: RE | Admit: 2015-10-06 | Discharge: 2015-10-06 | Disposition: A | Discharge status: Home / Self Care | Payer: BLUE CROSS/BLUE SHIELD | Source: Ambulatory Visit | Attending: Primary Care | Admitting: Primary Care

## 2015-10-06 ENCOUNTER — Encounter: Payer: Self-pay | Admitting: Primary Care

## 2015-10-06 ENCOUNTER — Ambulatory Visit (INDEPENDENT_AMBULATORY_CARE_PROVIDER_SITE_OTHER): Payer: BLUE CROSS/BLUE SHIELD | Admitting: Primary Care

## 2015-10-06 VITALS — BP 140/86 | HR 68 | Temp 97.8°F | Wt 163.8 lb

## 2015-10-06 DIAGNOSIS — M5442 Lumbago with sciatica, left side: Secondary | ICD-10-CM

## 2015-10-06 DIAGNOSIS — I10 Essential (primary) hypertension: Secondary | ICD-10-CM

## 2015-10-06 NOTE — Patient Instructions (Signed)
Complete xray(s) prior to leaving today. I will notify you of your results once received.  Continue blood pressure medication as prescribed.  Work to reduce fried foods, fatty foods, salty foods. This will help lower your blood pressure.  Follow up in 3 months for re-evaluation.  It was a pleasure to see you today!  DASH Eating Plan DASH stands for "Dietary Approaches to Stop Hypertension." The DASH eating plan is a healthy eating plan that has been shown to reduce high blood pressure (hypertension). Additional health benefits may include reducing the risk of type 2 diabetes mellitus, heart disease, and stroke. The DASH eating plan may also help with weight loss. WHAT DO I NEED TO KNOW ABOUT THE DASH EATING PLAN? For the DASH eating plan, you will follow these general guidelines:  Choose foods with a percent daily value for sodium of less than 5% (as listed on the food label).  Use salt-free seasonings or herbs instead of table salt or sea salt.  Check with your health care provider or pharmacist before using salt substitutes.  Eat lower-sodium products, often labeled as "lower sodium" or "no salt added."  Eat fresh foods.  Eat more vegetables, fruits, and low-fat dairy products.  Choose whole grains. Look for the word "whole" as the first word in the ingredient list.  Choose fish and skinless chicken or [place] more often than red meat. Limit fish, poultry, and meat to 6 oz (170 g) each day.  Limit sweets, desserts, sugars, and sugary drinks.  Choose heart-healthy fats.  Limit cheese to 1 oz (28 g) per day.  Eat more home-cooked food and less restaurant, buffet, and fast food.  Limit fried foods.  Cook foods using methods other than frying.  Limit canned vegetables. If you do use them, rinse them well to decrease the sodium.  When eating at a restaurant, ask that your food be prepared with less salt, or no salt if possible. WHAT FOODS CAN I EAT? Seek help from a  dietitian for individual calorie needs. Grains Whole grain or whole wheat bread. Brown rice. Whole grain or whole wheat pasta. Quinoa, bulgur, and whole grain cereals. Low-sodium cereals. Corn or whole wheat flour tortillas. Whole grain cornbread. Whole grain crackers. Low-sodium crackers. Vegetables Fresh or frozen vegetables (raw, steamed, roasted, or grilled). Low-sodium or reduced-sodium tomato and vegetable juices. Low-sodium or reduced-sodium tomato sauce and paste. Low-sodium or reduced-sodium canned vegetables.  Fruits All fresh, canned (in natural juice), or frozen fruits. Meat and Other Protein Products Ground beef (85% or leaner), grass-fed beef, or beef trimmed of fat. Skinless chicken or [place]. Ground chicken or [place]. Pork trimmed of fat. All fish and seafood. Eggs. Dried beans, peas, or lentils. Unsalted nuts and seeds. Unsalted canned beans. Dairy Low-fat dairy products, such as skim or 1% milk, 2% or reduced-fat cheeses, low-fat ricotta or cottage cheese, or plain low-fat yogurt. Low-sodium or reduced-sodium cheeses. Fats and Oils Tub margarines without trans fats. Light or reduced-fat mayonnaise and salad dressings (reduced sodium). Avocado. Safflower, olive, or canola oils. Natural peanut or almond butter. Other Unsalted popcorn and pretzels. The items listed above may not be a complete list of recommended foods or beverages. Contact your dietitian for more options. WHAT FOODS ARE NOT RECOMMENDED? Grains White bread. White pasta. White rice. Refined cornbread. Bagels and croissants. Crackers that contain trans fat. Vegetables Creamed or fried vegetables. Vegetables in a cheese sauce. Regular canned vegetables. Regular canned tomato sauce and paste. Regular tomato and vegetable juices. Fruits Dried fruits.  Canned fruit in light or heavy syrup. Fruit juice. Meat and Other Protein Products Fatty cuts of meat. Ribs, chicken wings, bacon, sausage, bologna, salami,  chitterlings, fatback, hot dogs, bratwurst, and packaged luncheon meats. Salted nuts and seeds. Canned beans with salt. Dairy Whole or 2% milk, cream, half-and-half, and cream cheese. Whole-fat or sweetened yogurt. Full-fat cheeses or blue cheese. Nondairy creamers and whipped toppings. Processed cheese, cheese spreads, or cheese curds. Condiments Onion and garlic salt, seasoned salt, table salt, and sea salt. Canned and packaged gravies. Worcestershire sauce. Tartar sauce. Barbecue sauce. Teriyaki sauce. Soy sauce, including reduced sodium. Steak sauce. Fish sauce. Oyster sauce. Cocktail sauce. Horseradish. Ketchup and mustard. Meat flavorings and tenderizers. Bouillon cubes. Hot sauce. Tabasco sauce. Marinades. Taco seasonings. Relishes. Fats and Oils Butter, stick margarine, lard, shortening, ghee, and bacon fat. Coconut, palm kernel, or palm oils. Regular salad dressings. Other Pickles and olives. Salted popcorn and pretzels. The items listed above may not be a complete list of foods and beverages to avoid. Contact your dietitian for more information. WHERE CAN I FIND MORE INFORMATION? National Heart, Lung, and Blood Institute: travelstabloid.com   This information is not intended to replace advice given to you by your health care provider. Make sure you discuss any questions you have with your health care provider.   Document Released: 06/21/2011 Document Revised: 07/23/2014 Document Reviewed: 05/06/2013 Elsevier Interactive Patient Education [employer].

## 2015-10-06 NOTE — Assessment & Plan Note (Signed)
Improved with lisinopril/hctz combo, however continues to be slightly over goal. Suspect this could be due to her recent low back/hip pain and sciatica. Will continue to monitor BP readings and have her send me home readings in several weeks.

## 2015-10-06 NOTE — Progress Notes (Signed)
Pre visit review using our clinic review tool, if applicable. No additional management support is needed unless otherwise documented below in the visit note. 

## 2015-10-06 NOTE — Assessment & Plan Note (Signed)
More so left hip and sciatic pain today. Exam with positive straight left leg raise. Will start with xrays. If negative, then will consider PT, them MRI. Continue ibuprofen PRN.

## 2015-10-06 NOTE — Progress Notes (Signed)
Subjective:    Patient ID: Maureen Orozco, female    DOB: 09-12-1965, 50 y.o.   MRN: [id number]  HPI  Maureen Orozco is a 50 year old female who presents today for follow up of hypertension. She was evaluated 3 weeks ago for persistent hypertension. She was changed from HCTZ 25 and lisinopril 5 to a combination of lisinopril/HCTZ 20/25 mg.  Since her last visit she is checking her blood pressure at home and has been readings of 140/80's-90's. Denies chest pain, headaches, dizziness. She continues to experience lower extremity/hip/back pain with sciatica that she believes may causing BP to be elevated.  1) Sciatica: Located to her left lower extremity that she notices at least 2 times daily, especially after sitting for a prolonged amount of time. She been taking ibuprofen with temporary improvement. Since her last visit her symptoms have not improved and they are no worse. She will also experience numbness to her left lower extremity down to her toes. Denies recent injury or trauma.    BP Readings from Last 3 Encounters:  10/06/15 140/86  09/15/15 168/98  06/06/15 130/82      Review of Systems  Respiratory: Negative for shortness of breath.   Cardiovascular: Negative for chest pain.  Musculoskeletal: Negative for back pain.       See HPI.  Neurological: Positive for numbness. Negative for dizziness and headaches.       Past Medical History  Diagnosis Date  . Hyperlipidemia     Social History   Social History  . Marital Status: Married    Spouse Name: N/A  . Number of Children: N/A  . Years of Education: N/A   Occupational History  . Not on file.   Social History Main Topics  . Smoking status: Current Every Day Smoker -- 0.50 packs/day    Types: Cigarettes  . Smokeless tobacco: Not on file  . Alcohol Use: No  . Drug Use: Not on file  . Sexual Activity: Not on file   Other Topics Concern  . Not on file   Social History Narrative   Married.   Works at [employer]  in [place].   Has one child.   Enjoys playing computer games, watching TV.    No past surgical history on file.  Family History  Problem Relation Age of Onset  . Adopted: Yes    No Known Allergies  Current Outpatient Prescriptions on File Prior to Visit  Medication Sig Dispense Refill  . atorvastatin (LIPITOR) 20 MG tablet TAKE ONE TABLET BY MOUTH ONCE DAILY 90 tablet 2  . buPROPion (WELLBUTRIN SR) 150 MG 12 hr tablet Take 1 tablet (150 mg total) by mouth 2 (two) times daily. 60 tablet 1  . lisinopril-hydrochlorothiazide (PRINZIDE,ZESTORETIC) 20-25 MG tablet Take 1 tablet by mouth daily. 30 tablet 3   No current facility-administered medications on file prior to visit.    BP 140/86 mmHg  Pulse 68  Temp(Src) 97.8 F (36.6 C) (Oral)  Wt 163 lb 12 oz (74.277 kg)    Objective:   Physical Exam  Constitutional: She appears well-nourished.  Cardiovascular: Normal rate and regular rhythm.   Pulmonary/Chest: Effort normal and breath sounds normal.  Musculoskeletal:       Left hip: She exhibits normal range of motion.       Lumbar back: She exhibits normal range of motion, no tenderness and no pain.  Pain starting from left lateral hip down through to lower extremity. Positive straight leg raise to left  extremity.   Skin: Skin is warm and dry.          Assessment & Plan:

## 2015-10-07 ENCOUNTER — Other Ambulatory Visit: Payer: Self-pay | Admitting: Primary Care

## 2015-10-07 DIAGNOSIS — M5442 Lumbago with sciatica, left side: Secondary | ICD-10-CM

## 2016-01-03 ENCOUNTER — Ambulatory Visit (INDEPENDENT_AMBULATORY_CARE_PROVIDER_SITE_OTHER): Payer: BLUE CROSS/BLUE SHIELD | Admitting: Primary Care

## 2016-01-03 ENCOUNTER — Encounter: Payer: Self-pay | Admitting: Primary Care

## 2016-01-03 VITALS — BP 140/86 | HR 87 | Temp 98.6°F | Ht 66.0 in | Wt 156.4 lb

## 2016-01-03 DIAGNOSIS — I10 Essential (primary) hypertension: Secondary | ICD-10-CM | POA: Diagnosis not present

## 2016-01-03 DIAGNOSIS — N951 Menopausal and female climacteric states: Secondary | ICD-10-CM | POA: Insufficient documentation

## 2016-01-03 DIAGNOSIS — M5442 Lumbago with sciatica, left side: Secondary | ICD-10-CM

## 2016-01-03 MED ORDER — VENLAFAXINE HCL ER 37.5 MG PO CP24: 37.5000 mg | ORAL_CAPSULE | Freq: Every day | ORAL | Status: DC

## 2016-01-03 NOTE — Assessment & Plan Note (Signed)
Hot flashes, mood swings, lack of period in 1 year. No depression, anxiety, SI/HI, recent stress. Will try low dose Effexor to help with vasomotor symptoms and mood swings related to menopause. Will check on patient in 4 weeks, re-evaluate in August.

## 2016-01-03 NOTE — Progress Notes (Signed)
Pre visit review using our clinic review tool, if applicable. No additional management support is needed unless otherwise documented below in the visit note. 

## 2016-01-03 NOTE — Patient Instructions (Addendum)
Your home blood pressure readings are stable. Please notify me if you get readings at or above 140/90 on a consistent basis.  Start Effexor XR 37.5 mg tablets for hot flashes and mood swings. Take 1 capsule by mouth every morning. This medication will take about 4 weeks to reach its fullest effect. Please give it time to work.  Please call me if you notice any of the side effects as discussed.  It was a pleasure to see you today!

## 2016-01-03 NOTE — Assessment & Plan Note (Signed)
Slightly above goal in office today, although home readings are stable.  Will continue same regimen at this time. She is asymptomatic.

## 2016-01-03 NOTE — Progress Notes (Signed)
Subjective:    Patient ID: Maureen Orozco, female    DOB: Dec 07, 1965, 50 y.o.   MRN: [id number]  HPI  Maureen Orozco is a 50 year old female who presents today for follow up of hypertension. Last visit her blood pressure continued to remain above goal on the lisinopril/hctz combination, but she also had acute lower back and hip pain with sciatica.   Her blood pressure today is slightly above goal in the clinic at 140/86 which is the same as her previous visits.She is continuing to see PT with improvements in her hip and back pain. She is checking her BP at home and getting readings of 130's/80 on average. Denies headaches, dizziness, chest pain.  2) Hot Flashes: Present during the day and evening and are very bothersome. Also with mood swings that are affecting her home life, which is not typical of this patient. She has not had a period in over 1 year. Her symptoms have been present for the past 3 months, worse over past several weeks. Denies difficulty sleeping, depression, anxiety, increased stress or abrupt changes in life. She is managed on Wellbutrin for tobacco cessation.   Review of Systems  Respiratory: Negative for shortness of breath.   Cardiovascular: Negative for chest pain.  Genitourinary:       Hot flashes, mood swings. No menstrual cycle in 1 year.  Neurological: Negative for dizziness and headaches.  Psychiatric/Behavioral: Negative for suicidal ideas.       Mood swings       Past Medical History  Diagnosis Date  . Hyperlipidemia      Social History   Social History  . Marital Status: Married    Spouse Name: N/A  . Number of Children: N/A  . Years of Education: N/A   Occupational History  . Not on file.   Social History Main Topics  . Smoking status: Current Every Day Smoker -- 0.50 packs/day    Types: Cigarettes  . Smokeless tobacco: Not on file  . Alcohol Use: No  . Drug Use: Not on file  . Sexual Activity: Not on file   Other Topics Concern  . Not  on file   Social History Narrative   Married.   Works at [employer] in [place].   Has one child.   Enjoys playing computer games, watching TV.    No past surgical history on file.  Family History  Problem Relation Age of Onset  . Adopted: Yes    No Known Allergies  Current Outpatient Prescriptions on File Prior to Visit  Medication Sig Dispense Refill  . atorvastatin (LIPITOR) 20 MG tablet TAKE ONE TABLET BY MOUTH ONCE DAILY 90 tablet 2  . buPROPion (WELLBUTRIN SR) 150 MG 12 hr tablet Take 1 tablet (150 mg total) by mouth 2 (two) times daily. 60 tablet 1  . lisinopril-hydrochlorothiazide (PRINZIDE,ZESTORETIC) 20-25 MG tablet Take 1 tablet by mouth daily. 30 tablet 3   No current facility-administered medications on file prior to visit.    BP 140/86 mmHg  Pulse 87  Temp(Src) 98.6 F (37 C) (Oral)  Ht 5\' 6"  (1.676 m)  Wt 156 lb 6.4 oz (70.943 kg)  BMI 25.26 kg/m2  SpO2 97%    Objective:   Physical Exam  Constitutional: She appears well-nourished.  Cardiovascular: Normal rate and regular rhythm.   Pulmonary/Chest: Effort normal and breath sounds normal.  Skin: Skin is warm and dry.  Psychiatric: She has a normal mood and affect.  Assessment & Plan:

## 2016-01-03 NOTE — Assessment & Plan Note (Signed)
Overall improvement with physical therapy, continues to participate.

## 2016-01-06 ENCOUNTER — Ambulatory Visit: Payer: BLUE CROSS/BLUE SHIELD | Admitting: Primary Care

## 2016-01-25 ENCOUNTER — Other Ambulatory Visit: Payer: Self-pay | Admitting: Primary Care

## 2016-01-25 DIAGNOSIS — I10 Essential (primary) hypertension: Secondary | ICD-10-CM

## 2016-01-25 MED ORDER — LISINOPRIL-HYDROCHLOROTHIAZIDE 20-25 MG PO TABS: 1.0000 | ORAL_TABLET | Freq: Every day | ORAL | Status: DC

## 2016-01-31 ENCOUNTER — Telehealth: Payer: Self-pay | Admitting: Primary Care

## 2016-01-31 NOTE — Telephone Encounter (Signed)
Message left for patient to return my call.  

## 2016-01-31 NOTE — Telephone Encounter (Signed)
-----   Message from Pleas Koch, NP sent at 01/03/2016  2:46 PM EDT ----- Regarding: Effexor Please check on patient since we started her on Effexor for hot flashes and mood swings. Any improvement?

## 2016-02-01 NOTE — Telephone Encounter (Signed)
Spoken to patient and she stated that she noticed a little in improvement of the hot flashes and mood swings. She said she is okay since it is helping.

## 2016-02-01 NOTE — Telephone Encounter (Signed)
Noted  

## 2016-02-05 ENCOUNTER — Other Ambulatory Visit: Payer: Self-pay | Admitting: Primary Care

## 2016-02-05 ENCOUNTER — Encounter: Payer: Self-pay | Admitting: Primary Care

## 2016-02-05 DIAGNOSIS — I10 Essential (primary) hypertension: Secondary | ICD-10-CM

## 2016-02-05 DIAGNOSIS — E785 Hyperlipidemia, unspecified: Secondary | ICD-10-CM

## 2016-02-05 DIAGNOSIS — R739 Hyperglycemia, unspecified: Secondary | ICD-10-CM

## 2016-02-13 ENCOUNTER — Other Ambulatory Visit (INDEPENDENT_AMBULATORY_CARE_PROVIDER_SITE_OTHER): Payer: BLUE CROSS/BLUE SHIELD

## 2016-02-13 DIAGNOSIS — R739 Hyperglycemia, unspecified: Secondary | ICD-10-CM | POA: Diagnosis not present

## 2016-02-13 DIAGNOSIS — E785 Hyperlipidemia, unspecified: Secondary | ICD-10-CM

## 2016-02-13 DIAGNOSIS — I10 Essential (primary) hypertension: Secondary | ICD-10-CM

## 2016-02-13 LAB — COMPREHENSIVE METABOLIC PANEL
ALK PHOS: 106 U/L (ref 39–117)
ALT: 16 U/L (ref 0–35)
AST: 16 U/L (ref 0–37)
Albumin: 4.2 g/dL (ref 3.5–5.2)
BUN: 12 mg/dL (ref 6–23)
CHLORIDE: 106 meq/L (ref 96–112)
CO2: 27 meq/L (ref 19–32)
Calcium: 9.5 mg/dL (ref 8.4–10.5)
Creatinine, Ser: 0.8 mg/dL (ref 0.40–1.20)
GFR: 81.43 mL/min (ref >60.00)
GLUCOSE: 116 mg/dL — AB (ref 70–99)
POTASSIUM: 3.6 meq/L (ref 3.5–5.1)
SODIUM: 141 meq/L (ref 135–145)
Total Bilirubin: 0.3 mg/dL (ref 0.2–1.2)
Total Protein: 7 g/dL (ref 6.0–8.3)

## 2016-02-13 LAB — LIPID PANEL
CHOL/HDL RATIO: 3
Cholesterol: 142 mg/dL (ref 0–200)
HDL: 47.7 mg/dL (ref >39.00)
LDL CALC: 82 mg/dL (ref 0–99)
NONHDL: 94.56
TRIGLYCERIDES: 65 mg/dL (ref 0.0–149.0)
VLDL: 13 mg/dL (ref 0.0–40.0)

## 2016-02-13 LAB — HEMOGLOBIN A1C: Hgb A1c MFr Bld: 5.9 % (ref 4.6–6.5)

## 2016-02-16 ENCOUNTER — Ambulatory Visit (INDEPENDENT_AMBULATORY_CARE_PROVIDER_SITE_OTHER): Payer: BLUE CROSS/BLUE SHIELD | Admitting: Primary Care

## 2016-02-16 ENCOUNTER — Encounter: Payer: Self-pay | Admitting: Primary Care

## 2016-02-16 VITALS — BP 152/96 | HR 88 | Temp 98.0°F | Ht 66.0 in | Wt 153.4 lb

## 2016-02-16 DIAGNOSIS — Z1239 Encounter for other screening for malignant neoplasm of breast: Secondary | ICD-10-CM | POA: Diagnosis not present

## 2016-02-16 DIAGNOSIS — E785 Hyperlipidemia, unspecified: Secondary | ICD-10-CM | POA: Diagnosis not present

## 2016-02-16 DIAGNOSIS — I10 Essential (primary) hypertension: Secondary | ICD-10-CM | POA: Diagnosis not present

## 2016-02-16 DIAGNOSIS — N951 Menopausal and female climacteric states: Secondary | ICD-10-CM

## 2016-02-16 DIAGNOSIS — Z0000 Encounter for general adult medical examination without abnormal findings: Secondary | ICD-10-CM

## 2016-02-16 DIAGNOSIS — R7303 Prediabetes: Secondary | ICD-10-CM | POA: Insufficient documentation

## 2016-02-16 DIAGNOSIS — M5442 Lumbago with sciatica, left side: Secondary | ICD-10-CM

## 2016-02-16 MED ORDER — AMLODIPINE BESYLATE 10 MG PO TABS: 10.0000 mg | ORAL_TABLET | Freq: Every day | ORAL | 1 refills | Status: DC

## 2016-02-16 NOTE — Assessment & Plan Note (Signed)
Immunizations up-to-date. Pap up-to-date. Mammogram due, ordered. Exam unremarkable. Labs with prediabetes, otherwise unremarkable. Discussed the importance of a healthy diet and regular exercise in order for weight loss and to reduce risk for development of diabetes.  Follow-up in one year for repeat physical.

## 2016-02-16 NOTE — Assessment & Plan Note (Signed)
Stable, much improved.

## 2016-02-16 NOTE — Assessment & Plan Note (Signed)
Stable on atorvastatin. 

## 2016-02-16 NOTE — Assessment & Plan Note (Signed)
Recent A1c of 5.9. Poor diet which includes sodas, fried foods, fatty foods. Discussed importance of reduction of those things as she is at risk for diabetes. Handout provided regarding prediabetes and recommended diet. We'll recheck A1c in 6 months.

## 2016-02-16 NOTE — Progress Notes (Signed)
Subjective:    Patient ID: Maureen Orozco, female    DOB: 06-May-1966, 50 y.o.   MRN: [id number]  HPI  Maureen Orozco is a 50 year old female who presents today for complete physical.  Immunizations: -Tetanus: Completed in September 2016 -Influenza: Completed in September 2016  Diet: She endorses a fair diet. Breakfast: Skips Lunch: Crackers and coke Dinner: Hot dogs, fried fish, steak, pizza, hamburgers Snacks: Crackers, fruit Desserts: 2 times weekly Beverages: Coke, occasional water  Exercise: She does not currently exercise. Eye exam: Has not completed in several years Dental exam: Has not completed in several yeasr Pap Smear: Completed in 2016, normal. Mammogram: Has not completed   Review of Systems  Constitutional: Negative for unexpected weight change.  HENT: Negative for rhinorrhea.   Respiratory: Negative for cough and shortness of breath.   Cardiovascular: Negative for chest pain.       Her BP has been running 150/90's for the past 1 month per home readings. Also with intermittent headaches.  Gastrointestinal: Negative for constipation and diarrhea.  Genitourinary: Negative for difficulty urinating and menstrual problem.  Musculoskeletal: Negative for arthralgias and myalgias.  Skin: Negative for rash.  Allergic/Immunologic: Negative for environmental allergies.  Neurological: Positive for headaches. Negative for dizziness and numbness.       Past Medical History:  Diagnosis Date  . Essential hypertension   . Hyperlipidemia      Social History   Social History  . Marital status: Married    Spouse name: N/A  . Number of children: N/A  . Years of education: N/A   Occupational History  . Not on file.   Social History Main Topics  . Smoking status: Current Every Day Smoker    Packs/day: 0.50    Types: Cigarettes  . Smokeless tobacco: Not on file  . Alcohol use No  . Drug use: Unknown  . Sexual activity: Not on file   Other Topics Concern  .  Not on file   Social History Narrative   Married.   Works at [employer] in [place].   Has one child.   Enjoys playing computer games, watching TV.    No past surgical history on file.  Family History  Problem Relation Age of Onset  . Adopted: Yes    No Known Allergies  Current Outpatient Prescriptions on File Prior to Visit  Medication Sig Dispense Refill  . atorvastatin (LIPITOR) 20 MG tablet TAKE ONE TABLET BY MOUTH ONCE DAILY 90 tablet 2  . buPROPion (WELLBUTRIN SR) 150 MG 12 hr tablet Take 1 tablet (150 mg total) by mouth 2 (two) times daily. 60 tablet 1  . lisinopril-hydrochlorothiazide (PRINZIDE,ZESTORETIC) 20-25 MG tablet Take 1 tablet by mouth daily. 90 tablet 3  . venlafaxine XR (EFFEXOR XR) 37.5 MG 24 hr capsule Take 1 capsule (37.5 mg total) by mouth daily with breakfast. 30 capsule 1   No current facility-administered medications on file prior to visit.     BP (!) 152/96   Pulse 88   Temp 98 F (36.7 C) (Oral)   Ht 5\' 6"  (1.676 m)   Wt 153 lb 6.4 oz (69.6 kg)   SpO2 98%   BMI 24.76 kg/m    Objective:   Physical Exam  Constitutional: She is oriented to person, place, and time. She appears well-nourished.  HENT:  Right Ear: Tympanic membrane and ear canal normal.  Left Ear: Tympanic membrane and ear canal normal.  Nose: Nose normal.  Mouth/Throat: Oropharynx is clear and  moist.  Eyes: Conjunctivae and EOM are normal. Pupils are equal, round, and reactive to light.  Neck: Neck supple. No thyromegaly present.  Cardiovascular: Normal rate and regular rhythm.   No murmur heard. Pulmonary/Chest: Effort normal and breath sounds normal. She has no rales.  Abdominal: Soft. Bowel sounds are normal. There is no tenderness.  Musculoskeletal: Normal range of motion.  Lymphadenopathy:    She has no cervical adenopathy.  Neurological: She is alert and oriented to person, place, and time. She has normal reflexes. No cranial nerve deficit.  Skin: Skin is warm and  dry. No rash noted.  Psychiatric: She has a normal mood and affect.          Assessment & Plan:

## 2016-02-16 NOTE — Assessment & Plan Note (Addendum)
Above goal again during office visit, also with home readings. Given persistent elevated readings, we'll need to add on another medication. Continue lisinopril/hydrochlorothiazide, add amlodipine 10 mg once daily. She will check her blood pressure daily for the next 2 weeks and report back readings. BMP today stable.

## 2016-02-16 NOTE — Patient Instructions (Addendum)
Start Amlodipine 10 mg for high blood pressure. Take 1 tablet by mouth once daily. Continue lisinopril/hctz 20/25 mg.   Check your blood pressure daily, around the same time of day, for the next 2 weeks.   Ensure that you have rested for 30 minutes prior to checking your blood pressure. Record your readings and I will call you in 2 weeks.  You will be contacted regarding your mammogram.  Please give the [facility] a call if you've not heard back in 1 week.  You are prediabetes which means you are at risk for developing diabetes. You must limit consumption of soda, sweets, fried foods, fatty foods.  Follow up in 6 months for re-evaluation or sooner if needed.  It was a pleasure to see you today!  Prediabetes Eating Plan Prediabetes--also called impaired glucose tolerance or impaired fasting glucose--is a condition that causes blood sugar (blood glucose) levels to be higher than normal. Following a healthy diet can help to keep prediabetes under control. It can also help to lower the risk of type 2 diabetes and heart disease, which are increased in people who have prediabetes. Along with regular exercise, a healthy diet:  Promotes weight loss.  Helps to control blood sugar levels.  Helps to improve the way that the body uses insulin. WHAT DO I NEED TO KNOW ABOUT THIS EATING PLAN?  Use the glycemic index (GI) to plan your meals. The index tells you how quickly a food will raise your blood sugar. Choose low-GI foods. These foods take a longer time to raise blood sugar.  Pay close attention to the amount of carbohydrates in the food that you eat. Carbohydrates increase blood sugar levels.  Keep track of how many calories you take in. Eating the right amount of calories will help you to achieve a healthy weight. Losing about 7 percent of your starting weight can help to prevent type 2 diabetes.  You may want to follow a Mediterranean diet. This diet includes a lot of vegetables,  lean meats or fish, whole grains, fruits, and healthy oils and fats. WHAT FOODS CAN I EAT? Grains Whole grains, such as whole-wheat or whole-grain breads, crackers, cereals, and pasta. Unsweetened oatmeal. Bulgur. Barley. Quinoa. Brown rice. Corn or whole-wheat flour tortillas or taco shells. Vegetables Lettuce. Spinach. Peas. Beets. Cauliflower. Cabbage. Broccoli. Carrots. Tomatoes. Squash. Eggplant. Herbs. Peppers. Onions. Cucumbers. Brussels sprouts. Fruits Berries. Bananas. Apples. Oranges. Grapes. Papaya. Mango. Pomegranate. Kiwi. Grapefruit. Cherries. Meats and Other Protein Sources Seafood. Lean meats, such as chicken and [place] or lean cuts of pork and beef. Tofu. Eggs. Nuts. Beans. Dairy Low-fat or fat-free dairy products, such as yogurt, cottage cheese, and cheese. Beverages Water. Tea. Coffee. Sugar-free or diet soda. Seltzer water. Milk. Milk alternatives, such as soy or almond milk. Condiments Mustard. Relish. Low-fat, low-sugar ketchup. Low-fat, low-sugar barbecue sauce. Low-fat or fat-free mayonnaise. Sweets and Desserts Sugar-free or low-fat pudding. Sugar-free or low-fat ice cream and other frozen treats. Fats and Oils Avocado. Walnuts. Olive oil. The items listed above may not be a complete list of recommended foods or beverages. Contact your dietitian for more options.  WHAT FOODS ARE NOT RECOMMENDED? Grains Refined white flour and flour products, such as bread, pasta, snack foods, and cereals. Beverages Sweetened drinks, such as sweet iced tea and soda. Sweets and Desserts Baked goods, such as cake, cupcakes, pastries, cookies, and cheesecake. The items listed above may not be a complete list of foods and beverages to avoid. Contact your dietitian for more  information.   This information is not intended to replace advice given to you by your health care provider. Make sure you discuss any questions you have with your health care provider.   Document Released:  11/16/2014 Document Reviewed: 11/16/2014 Elsevier Interactive Patient Education [employer].

## 2016-02-16 NOTE — Assessment & Plan Note (Signed)
Improved overall. No complaints today.

## 2016-02-16 NOTE — Progress Notes (Signed)
Pre visit review using our clinic review tool, if applicable. No additional management support is needed unless otherwise documented below in the visit note. 

## 2016-02-29 ENCOUNTER — Ambulatory Visit
Admission: RE | Admit: 2016-02-29 | Discharge: 2016-02-29 | Disposition: A | Discharge status: Home / Self Care | Payer: BLUE CROSS/BLUE SHIELD | Source: Ambulatory Visit | Attending: Primary Care | Admitting: Primary Care

## 2016-02-29 DIAGNOSIS — Z1239 Encounter for other screening for malignant neoplasm of breast: Secondary | ICD-10-CM

## 2016-03-01 ENCOUNTER — Other Ambulatory Visit: Payer: Self-pay | Admitting: Primary Care

## 2016-03-01 ENCOUNTER — Telehealth: Payer: Self-pay | Admitting: Primary Care

## 2016-03-01 DIAGNOSIS — N951 Menopausal and female climacteric states: Secondary | ICD-10-CM

## 2016-03-01 DIAGNOSIS — R928 Other abnormal and inconclusive findings on diagnostic imaging of breast: Secondary | ICD-10-CM

## 2016-03-01 MED ORDER — VENLAFAXINE HCL ER 37.5 MG PO CP24: 37.5000 mg | ORAL_CAPSULE | Freq: Every day | ORAL | 1 refills | Status: DC

## 2016-03-01 NOTE — Telephone Encounter (Signed)
-----   Message from Pleas Koch, NP sent at 02/16/2016  5:26 PM EDT ----- Regarding: BP Please check on patient's blood pressure readings. What are running?

## 2016-03-01 NOTE — Telephone Encounter (Signed)
Noted.  Refill sent to pharmacy. 

## 2016-03-01 NOTE — Telephone Encounter (Signed)
Spoken to patient and she stated that she have not been checking her blood pressure since taking care of her husband. She will start checking it again.  Also patient would like a refill of Effexor 37.5 mg capsule.

## 2016-03-05 ENCOUNTER — Ambulatory Visit
Admission: RE | Admit: 2016-03-05 | Discharge: 2016-03-05 | Disposition: A | Discharge status: Home / Self Care | Payer: BLUE CROSS/BLUE SHIELD | Source: Ambulatory Visit | Attending: Primary Care | Admitting: Primary Care

## 2016-03-05 ENCOUNTER — Other Ambulatory Visit: Payer: Self-pay | Admitting: Primary Care

## 2016-03-05 DIAGNOSIS — R928 Other abnormal and inconclusive findings on diagnostic imaging of breast: Secondary | ICD-10-CM

## 2016-03-05 DIAGNOSIS — N6320 Unspecified lump in the left breast, unspecified quadrant: Secondary | ICD-10-CM

## 2016-03-09 ENCOUNTER — Other Ambulatory Visit: Payer: Self-pay | Admitting: Primary Care

## 2016-03-09 ENCOUNTER — Ambulatory Visit
Admission: RE | Admit: 2016-03-09 | Discharge: 2016-03-09 | Disposition: A | Discharge status: Home / Self Care | Payer: BLUE CROSS/BLUE SHIELD | Source: Ambulatory Visit | Attending: Primary Care | Admitting: Primary Care

## 2016-03-09 DIAGNOSIS — N6320 Unspecified lump in the left breast, unspecified quadrant: Secondary | ICD-10-CM

## 2016-03-09 HISTORY — PX: BREAST BIOPSY: SHX20

## 2016-04-09 ENCOUNTER — Encounter: Payer: Self-pay | Admitting: Primary Care

## 2016-04-09 ENCOUNTER — Ambulatory Visit (INDEPENDENT_AMBULATORY_CARE_PROVIDER_SITE_OTHER): Payer: BLUE CROSS/BLUE SHIELD | Admitting: Primary Care

## 2016-04-09 VITALS — BP 132/88 | HR 110 | Temp 98.5°F | Wt 150.0 lb

## 2016-04-09 DIAGNOSIS — H6123 Impacted cerumen, bilateral: Secondary | ICD-10-CM | POA: Diagnosis not present

## 2016-04-09 DIAGNOSIS — J019 Acute sinusitis, unspecified: Secondary | ICD-10-CM

## 2016-04-09 NOTE — Patient Instructions (Signed)
Your symptoms are representative of a viral illness which will resolve on its own over time. Our goal is to treat your symptoms in order to aid your body in the healing process and to make you more comfortable.   Nasal Congestion/Ear Pressure: Try using Flonase (fluticasone) nasal spray. Instill 1 spray in each nostril twice daily.   Continue Advil as needed.  You can also try Neti Pot Rinses to help clean out the sinus cavities.  Please notify me if you develop persistent fevers of 101, start coughing up green mucous, notice increased fatigue or weakness, or feel worse in 5 days.  Increase consumption of water intake and rest.  It was a pleasure to see you today!

## 2016-04-09 NOTE — Progress Notes (Signed)
Subjective:    Patient ID: Maureen Orozco, female    DOB: 04/29/1966, 50 y.o.   MRN: [id number]  HPI  Maureen Orozco is a 50 year old female who presents today with a chief complaint of sinus pressure. She also reports ear pain and fatigue. Denies sore throat, fevers, cough, sick contacts. Her symptoms began 4-5 days ago. She's taken Advil and OTC sinus medication without much improvement. She is a current smoker.  Review of Systems  Constitutional: Positive for fatigue. Negative for chills and fever.  HENT: Positive for congestion, ear pain and sinus pressure. Negative for sore throat.   Respiratory: Negative for shortness of breath.   Cardiovascular: Negative for chest pain.       Past Medical History:  Diagnosis Date  . Essential hypertension   . Hyperlipidemia      Social History   Social History  . Marital status: Married    Spouse name: N/A  . Number of children: N/A  . Years of education: N/A   Occupational History  . Not on file.   Social History Main Topics  . Smoking status: Current Every Day Smoker    Packs/day: 0.50    Types: Cigarettes  . Smokeless tobacco: Not on file  . Alcohol use No  . Drug use: Unknown  . Sexual activity: Not on file   Other Topics Concern  . Not on file   Social History Narrative   Married.   Works at [employer] in [place].   Has one child.   Enjoys playing computer games, watching TV.    No past surgical history on file.  Family History  Problem Relation Age of Onset  . Adopted: Yes    No Known Allergies  Current Outpatient Prescriptions on File Prior to Visit  Medication Sig Dispense Refill  . amLODipine (NORVASC) 10 MG tablet Take 1 tablet (10 mg total) by mouth daily. 30 tablet 1  . atorvastatin (LIPITOR) 20 MG tablet TAKE ONE TABLET BY MOUTH ONCE DAILY 90 tablet 2  . buPROPion (WELLBUTRIN SR) 150 MG 12 hr tablet Take 1 tablet (150 mg total) by mouth 2 (two) times daily. 60 tablet 1  .  lisinopril-hydrochlorothiazide (PRINZIDE,ZESTORETIC) 20-25 MG tablet Take 1 tablet by mouth daily. 90 tablet 3  . venlafaxine XR (EFFEXOR XR) 37.5 MG 24 hr capsule Take 1 capsule (37.5 mg total) by mouth daily with breakfast. 90 capsule 1   No current facility-administered medications on file prior to visit.     BP 132/88 (BP Location: Left Arm, Patient Position: Sitting, Cuff Size: Normal)   Pulse (!) 110   Temp 98.5 F (36.9 C) (Oral)   Wt 150 lb (68 kg)   SpO2 97%   BMI 24.21 kg/m    Objective:   Physical Exam  Constitutional: She appears well-nourished. She does not appear ill.  HENT:  Right Ear: Ear canal normal.  Left Ear: Ear canal normal.  Nose: Right sinus exhibits frontal sinus tenderness. Right sinus exhibits no maxillary sinus tenderness. Left sinus exhibits no maxillary sinus tenderness and no frontal sinus tenderness.  Mouth/Throat: Oropharynx is clear and moist.  Bilateral cerumen impaction to canals. Right canal unable to be completed cleaned out. Left TM unremarkable. Tissue inside bilateral canals irritated given irrigation. No infectious process.  Eyes: Conjunctivae are normal.  Neck: Neck supple.  Cardiovascular: Normal rate and regular rhythm.   Pulmonary/Chest: Effort normal and breath sounds normal. She has no wheezes. She has no rales.  Lymphadenopathy:  She has no cervical adenopathy.  Skin: Skin is warm and dry.          Assessment & Plan:  Acute Siusitis:  Sinus pressure, ear pain, fatigue x 4-5 days. No improvement with advil and OTC sinus medication. Exam today with mild tenderness to right frontal sinus cavity, otherwise unremarkable. Lungs clear. Bilateral canals impacted with cerumen. Canals with irritation post irrigation. Unable to visualize right TM given deep impaction. Administered Debrox drops in the office today.  Suspect viral involvement and will treat with conservative measures. Flonase, Advil, Neti Pot Rinses.  Return  precautions provided.  Maureen Flow, NP

## 2016-04-09 NOTE — Progress Notes (Signed)
Pre visit review using our clinic review tool, if applicable. No additional management support is needed unless otherwise documented below in the visit note. 

## 2016-04-17 ENCOUNTER — Encounter: Payer: Self-pay | Admitting: Primary Care

## 2016-06-08 ENCOUNTER — Other Ambulatory Visit: Payer: Self-pay | Admitting: Primary Care

## 2016-06-08 DIAGNOSIS — I10 Essential (primary) hypertension: Secondary | ICD-10-CM

## 2016-08-20 ENCOUNTER — Encounter: Payer: Self-pay | Admitting: Primary Care

## 2016-08-20 ENCOUNTER — Ambulatory Visit (INDEPENDENT_AMBULATORY_CARE_PROVIDER_SITE_OTHER): Payer: BLUE CROSS/BLUE SHIELD | Admitting: Primary Care

## 2016-08-20 VITALS — BP 148/100 | HR 89 | Temp 98.1°F | Ht 66.0 in | Wt 148.4 lb

## 2016-08-20 DIAGNOSIS — N951 Menopausal and female climacteric states: Secondary | ICD-10-CM | POA: Diagnosis not present

## 2016-08-20 DIAGNOSIS — Z720 Tobacco use: Secondary | ICD-10-CM

## 2016-08-20 DIAGNOSIS — I10 Essential (primary) hypertension: Secondary | ICD-10-CM

## 2016-08-20 DIAGNOSIS — R7303 Prediabetes: Secondary | ICD-10-CM | POA: Diagnosis not present

## 2016-08-20 DIAGNOSIS — E785 Hyperlipidemia, unspecified: Secondary | ICD-10-CM

## 2016-08-20 NOTE — Progress Notes (Signed)
Subjective:    Patient ID: Maureen Orozco, female    DOB: 20-Mar-1966, 51 y.o.   MRN: [id number]  HPI  Maureen Orozco is a 51 year old female who presents today for follow up.  1) Essential Hypertension: Currently managed on lisinopril 20/25 mg and Amlodipine 10 mg. She's not had her lisinopril/HCTZ in 1 week due to financial issues. Her BP runs 120/80's at home when she's on her medication. She denies chest pain, dizziness, visual changes.  2) Tobacco Abuse: Currently managed on Wellbutrin SR 150 mg BID. She's working on quitting and is down to 1/2 PPD. She has noticed improvement on Bupropion.   3) Prediabetes: A1C of 5.9 in July 2017. She is due today for recheck today. She has been under a lot of financial stress over the last several months. She denies polyuria, numbness.  4) Hyperlipidemia: Currently managed on atorvastatin 20 mg. Lipid panel in July 2017 stable. She denies myalgias.  5) Hot Flashes: Currently managed on Effexor XR 37.5 mg. Her hot flashes improved since she started taking Effexor but the medication is too costly. She has about 1 month's supply remaining.  Review of Systems  Eyes: Negative for visual disturbance.  Respiratory: Negative for shortness of breath.   Cardiovascular: Negative for chest pain.  Genitourinary:       Improvement in hot flashes  Musculoskeletal: Negative for myalgias.  Neurological: Negative for dizziness and numbness.       Past Medical History:  Diagnosis Date  . Essential hypertension   . Hyperlipidemia      Social History   Social History  . Marital status: Married    Spouse name: N/A  . Number of children: N/A  . Years of education: N/A   Occupational History  . Not on file.   Social History Main Topics  . Smoking status: Current Every Day Smoker    Packs/day: 0.50    Types: Cigarettes  . Smokeless tobacco: Never Used  . Alcohol use No  . Drug use: Unknown  . Sexual activity: Not on file   Other Topics Concern    . Not on file   Social History Narrative   Married.   Works at [employer] in [place].   Has one child.   Enjoys playing computer games, watching TV.    No past surgical history on file.  Family History  Problem Relation Age of Onset  . Adopted: Yes    No Known Allergies  Current Outpatient Prescriptions on File Prior to Visit  Medication Sig Dispense Refill  . amLODipine (NORVASC) 10 MG tablet TAKE ONE TABLET BY MOUTH ONCE DAILY 90 tablet 1  . atorvastatin (LIPITOR) 20 MG tablet TAKE ONE TABLET BY MOUTH ONCE DAILY 90 tablet 1  . buPROPion (WELLBUTRIN SR) 150 MG 12 hr tablet Take 1 tablet (150 mg total) by mouth 2 (two) times daily. 60 tablet 1  . venlafaxine XR (EFFEXOR XR) 37.5 MG 24 hr capsule Take 1 capsule (37.5 mg total) by mouth daily with breakfast. 90 capsule 1  . lisinopril-hydrochlorothiazide (PRINZIDE,ZESTORETIC) 20-25 MG tablet Take 1 tablet by mouth daily. (Patient not taking: Reported on 08/20/2016) 90 tablet 3   No current facility-administered medications on file prior to visit.     BP (!) 148/100   Pulse 89   Temp 98.1 F (36.7 C) (Oral)   Ht 5\' 6"  (1.676 m)   Wt 148 lb 6.4 oz (67.3 kg)   SpO2 98%   BMI 23.95 kg/m  Objective:   Physical Exam  Constitutional: She appears well-nourished.  Neck: Neck supple.  Cardiovascular: Normal rate and regular rhythm.   Pulmonary/Chest: Effort normal and breath sounds normal.  Skin: Skin is warm and dry.  Psychiatric: She has a normal mood and affect.          Assessment & Plan:

## 2016-08-20 NOTE — Assessment & Plan Note (Signed)
Smoking 1/2 PPD, continue Bupropion for now. Consider discontinuing if no change in 6 months.

## 2016-08-20 NOTE — Assessment & Plan Note (Signed)
Stable in July 2017, repeat this summer.

## 2016-08-20 NOTE — Patient Instructions (Signed)
Start weaning off of your venlafaxine tablets for hot flashes. Start taking 1 tablet every other day for 14 days, then 1 tablet twice weekly until your bottle is empty.  Try Black Cohosh tablets for hot flashes. This medication can be purchased over the counter. Please call or e-mail me if this doesn't help.  Continue your blood pressure medications.  Complete lab work prior to leaving today. I will notify you of your results once received.   Follow up in 6 months for your annual physical or sooner if needed.  It was a pleasure to see you today!

## 2016-08-20 NOTE — Progress Notes (Signed)
Pre visit review using our clinic review tool, if applicable. No additional management support is needed unless otherwise documented below in the visit note. 

## 2016-08-20 NOTE — Assessment & Plan Note (Signed)
Due for A1C recheck today.

## 2016-08-20 NOTE — Assessment & Plan Note (Signed)
Above goal today, but has not been able to afford lisinopril/hctz. She plans on purchasing this medication this week. Continue Amlodipine. Home BP readings stable when on all meds. Will continue to monitor.

## 2016-08-20 NOTE — Assessment & Plan Note (Signed)
Improved on Effexor, but now too costly. Will have her wean off of Effexor to the end of her supply and try Black Cohosh OTC. If no improvement, consider low dose Paxil.

## 2016-08-21 LAB — HEMOGLOBIN A1C: Hgb A1c MFr Bld: 6.1 % (ref 4.6–6.5)

## 2016-09-25 ENCOUNTER — Other Ambulatory Visit: Payer: Self-pay | Admitting: Primary Care

## 2016-09-25 DIAGNOSIS — N951 Menopausal and female climacteric states: Secondary | ICD-10-CM

## 2016-09-25 NOTE — Telephone Encounter (Signed)
Ok to refill? Electronically refill request for venlafaxine XR (EFFEXOR XR) 37.5 MG 24 hr capsule. Last prescribed on 03/01/2016. Last seen on 08/20/2016

## 2016-09-25 NOTE — Telephone Encounter (Signed)
The last time we spoke the Effexor was too costly. Is she wanting a refill since we received a refill request? If not then has she tried [employer] OTC? Any improvement?

## 2016-09-28 ENCOUNTER — Other Ambulatory Visit: Payer: Self-pay | Admitting: Primary Care

## 2016-09-28 DIAGNOSIS — Z720 Tobacco use: Secondary | ICD-10-CM

## 2016-09-28 MED ORDER — VENLAFAXINE HCL ER 37.5 MG PO CP24: 37.5000 mg | ORAL_CAPSULE | Freq: Every day | ORAL | 1 refills | Status: DC

## 2016-09-28 NOTE — Telephone Encounter (Signed)
Spoken to patient and she stated that she would like a refill of the Effexor. They now have insurance coverage for prescriptions. Please send refills.

## 2016-09-28 NOTE — Telephone Encounter (Signed)
Noted,refill sent

## 2017-01-22 ENCOUNTER — Encounter: Payer: Self-pay | Admitting: Primary Care

## 2017-02-21 ENCOUNTER — Encounter: Payer: Self-pay | Admitting: Primary Care

## 2017-02-26 ENCOUNTER — Encounter: Payer: Self-pay | Admitting: Primary Care

## 2017-03-13 ENCOUNTER — Other Ambulatory Visit: Payer: Self-pay | Admitting: Primary Care

## 2017-03-13 DIAGNOSIS — R7303 Prediabetes: Secondary | ICD-10-CM

## 2017-03-13 DIAGNOSIS — I10 Essential (primary) hypertension: Secondary | ICD-10-CM

## 2017-03-13 DIAGNOSIS — E785 Hyperlipidemia, unspecified: Secondary | ICD-10-CM

## 2017-03-19 ENCOUNTER — Other Ambulatory Visit (INDEPENDENT_AMBULATORY_CARE_PROVIDER_SITE_OTHER): Payer: Self-pay

## 2017-03-19 DIAGNOSIS — I10 Essential (primary) hypertension: Secondary | ICD-10-CM

## 2017-03-19 DIAGNOSIS — E785 Hyperlipidemia, unspecified: Secondary | ICD-10-CM

## 2017-03-19 DIAGNOSIS — R7303 Prediabetes: Secondary | ICD-10-CM

## 2017-03-19 LAB — LIPID PANEL
CHOL/HDL RATIO: 5
Cholesterol: 256 mg/dL — ABNORMAL HIGH (ref 0–200)
HDL: 51 mg/dL (ref >39.00)
LDL Cholesterol: 184 mg/dL — ABNORMAL HIGH (ref 0–99)
NonHDL: 205.43
TRIGLYCERIDES: 109 mg/dL (ref 0.0–149.0)
VLDL: 21.8 mg/dL (ref 0.0–40.0)

## 2017-03-19 LAB — COMPREHENSIVE METABOLIC PANEL
ALK PHOS: 69 U/L (ref 39–117)
ALT: 8 U/L (ref 0–35)
AST: 12 U/L (ref 0–37)
Albumin: 4.4 g/dL (ref 3.5–5.2)
BUN: 13 mg/dL (ref 6–23)
CO2: 23 mEq/L (ref 19–32)
Calcium: 10 mg/dL (ref 8.4–10.5)
Chloride: 106 mEq/L (ref 96–112)
Creatinine, Ser: 0.81 mg/dL (ref 0.40–1.20)
GFR: 79.9 mL/min (ref >60.00)
GLUCOSE: 111 mg/dL — AB (ref 70–99)
POTASSIUM: 3.5 meq/L (ref 3.5–5.1)
Sodium: 140 mEq/L (ref 135–145)
TOTAL PROTEIN: 7.1 g/dL (ref 6.0–8.3)
Total Bilirubin: 0.4 mg/dL (ref 0.2–1.2)

## 2017-03-19 LAB — HEMOGLOBIN A1C: HEMOGLOBIN A1C: 6.2 % (ref 4.6–6.5)

## 2017-03-20 ENCOUNTER — Ambulatory Visit (INDEPENDENT_AMBULATORY_CARE_PROVIDER_SITE_OTHER): Payer: Self-pay | Admitting: Primary Care

## 2017-03-20 ENCOUNTER — Encounter: Payer: Self-pay | Admitting: Primary Care

## 2017-03-20 VITALS — BP 154/84 | HR 101 | Temp 98.4°F | Ht 66.0 in | Wt 139.4 lb

## 2017-03-20 DIAGNOSIS — Z0000 Encounter for general adult medical examination without abnormal findings: Secondary | ICD-10-CM

## 2017-03-20 DIAGNOSIS — E785 Hyperlipidemia, unspecified: Secondary | ICD-10-CM

## 2017-03-20 DIAGNOSIS — Z720 Tobacco use: Secondary | ICD-10-CM

## 2017-03-20 DIAGNOSIS — I10 Essential (primary) hypertension: Secondary | ICD-10-CM

## 2017-03-20 DIAGNOSIS — N951 Menopausal and female climacteric states: Secondary | ICD-10-CM

## 2017-03-20 MED ORDER — ATORVASTATIN CALCIUM 20 MG PO TABS: 20.0000 mg | ORAL_TABLET | Freq: Every day | ORAL | 3 refills | Status: DC

## 2017-03-20 MED ORDER — LISINOPRIL-HYDROCHLOROTHIAZIDE 20-25 MG PO TABS: ORAL_TABLET | ORAL | 3 refills | Status: DC

## 2017-03-20 MED ORDER — VENLAFAXINE HCL ER 37.5 MG PO CP24: ORAL_CAPSULE | ORAL | 3 refills | Status: DC

## 2017-03-20 MED ORDER — AMLODIPINE BESYLATE 10 MG PO TABS: 10.0000 mg | ORAL_TABLET | Freq: Every day | ORAL | 3 refills | Status: DC

## 2017-03-20 NOTE — Assessment & Plan Note (Signed)
Immunizations UTD. Will return in 1 month for flu shot. Pap smear UTD, due in 2019. Mammogram due, she would like to defer until she gets insurance coverage. Recommended to improve diet and start exercising. Exam unremarkable. Labs with hyperlipidemia which is expected given lack of atorvastatin. Also with prediabetes, discussed this today. Follow up in 1 year for annual exam.

## 2017-03-20 NOTE — Assessment & Plan Note (Signed)
Has been off for the past 2-3 months, felt very well managed on this for hot flashes. She'd like to resume. Refills sent to pharmacy.

## 2017-03-20 NOTE — Progress Notes (Signed)
Subjective:    Patient ID: Maureen Orozco, female    DOB: 06-May-1966, 51 y.o.   MRN: [id number]  HPI  Maureen Orozco is a 51 year old female who presents today for complete physical.  Overall doing okay, has been off of all medications for the past 2-3 months due to lack of insurance.   Immunizations: -Tetanus: Completed in 2016 -Influenza: Due this season   Diet: She endorses a fair diet. Breakfast: Skips Lunch: Canned pasta, sandiwch Dinner: Meat, potato, salad, pasta, occasional hamburger Snacks: Chips and salsa  Desserts: Occasional cookies. Beverages: Coke, little water.   Exercise: She does not currently exercise Eye exam: Completed years ago. Dental exam: Has not completed recently. Pap Smear: Completed in 2016, due in 2019 Mammogram: Completed in 2017, would like to defer until she gets insurance.   Review of Systems  Constitutional: Positive for fatigue. Negative for unexpected weight change.  HENT: Negative for rhinorrhea.   Respiratory: Negative for cough and shortness of breath.   Cardiovascular: Negative for chest pain.       Intermittent lower extremity/ankle edema  Gastrointestinal: Negative for constipation and diarrhea.  Genitourinary: Negative for difficulty urinating and menstrual problem.  Musculoskeletal: Negative for arthralgias and myalgias.       Intermittent plantar foot pain  Skin: Negative for rash.  Allergic/Immunologic: Negative for environmental allergies.  Neurological: Negative for dizziness, numbness and headaches.  Psychiatric/Behavioral:       Some depression regarding her husbands illness. Will be restarting Effexor.        Past Medical History:  Diagnosis Date  . Essential hypertension   . Hyperlipidemia      Social History   Social History  . Marital status: Married    Spouse name: N/A  . Number of children: N/A  . Years of education: N/A   Occupational History  . Not on file.   Social History Main Topics  .  Smoking status: Current Every Day Smoker    Packs/day: 0.50    Types: Cigarettes  . Smokeless tobacco: Never Used  . Alcohol use No  . Drug use: Unknown  . Sexual activity: Not on file   Other Topics Concern  . Not on file   Social History Narrative   Married.   Works at [employer] in [place].   Has one child.   Enjoys playing computer games, watching TV.    No past surgical history on file.  Family History  Problem Relation Age of Onset  . Adopted: Yes    No Known Allergies  No current outpatient prescriptions on file prior to visit.   No current facility-administered medications on file prior to visit.     BP (!) 154/84   Pulse (!) 101   Temp 98.4 F (36.9 C) (Oral)   Ht 5\' 6"  (7.989 m)   Wt 139 lb 6.4 oz (63.2 kg)   BMI 22.50 kg/m    Objective:   Physical Exam  Constitutional: She is oriented to person, place, and time. She appears well-nourished.  HENT:  Right Ear: Tympanic membrane and ear canal normal.  Left Ear: Tympanic membrane and ear canal normal.  Nose: Nose normal.  Mouth/Throat: Oropharynx is clear and moist.  Eyes: Pupils are equal, round, and reactive to light. Conjunctivae and EOM are normal.  Neck: Neck supple. No thyromegaly present.  Cardiovascular: Normal rate and regular rhythm.   No murmur heard. Pulmonary/Chest: Effort normal and breath sounds normal. She has no rales.  Abdominal: Soft.  Bowel sounds are normal. There is no tenderness.  Musculoskeletal: Normal range of motion.  Lymphadenopathy:    She has no cervical adenopathy.  Neurological: She is alert and oriented to person, place, and time. She has normal reflexes. No cranial nerve deficit.  Skin: Skin is warm and dry. No rash noted.  Psychiatric: She has a normal mood and affect.          Assessment & Plan:

## 2017-03-20 NOTE — Patient Instructions (Signed)
Check your blood pressure daily, around the same time of day, for the next 2 weeks.  Ensure that you have rested for 30 minutes prior to checking your blood pressure. Record your readings and I'll call you for those readings in 2 weeks.  Schedule a lab only appointment in 2 months to recheck your cholesterol.   Call me when you are ready to schedule your mammogram.   Follow up in 1 year for your annual exam or sooner if needed.  It was a pleasure to see you today!

## 2017-03-20 NOTE — Assessment & Plan Note (Addendum)
No improvement on Wellbutrin with smoking cessation. No refills requested today. She is not ready to quit but has tried vaping in between cigarettes.

## 2017-03-20 NOTE — Assessment & Plan Note (Signed)
No atorvastatin 20 mg in 2-3 months. Recent lipid panel above goal today, restart atorvastatin. Recheck lipids in 2-3 months.

## 2017-03-20 NOTE — Assessment & Plan Note (Addendum)
Has not had lisinopril-HCTZ and Amlodipine in 2-3 months due to lack of insurance. Restart both today. Refills sent to pharmacy. Suspect lower extremity edema will improve with HCTZ, also discussed elevation of extremities and comfortable insoles to shoes.

## 2017-03-21 ENCOUNTER — Telehealth: Payer: Self-pay

## 2017-03-21 NOTE — Telephone Encounter (Signed)
Mr Smithers said that [facility] and [facility] do not have any rx for pt. I spoke with Apolonio Schneiders at [facility] and rx ready for pick up and I spoke with Narda Rutherford at [facility] and rxs are ready for pick up. Mr Cheramie voiced understanding.

## 2017-04-03 ENCOUNTER — Encounter: Payer: Self-pay | Admitting: Primary Care

## 2017-04-08 NOTE — Telephone Encounter (Signed)
Please schedule patient for follow-up blood pressure office visit in 1 month.

## 2017-04-09 NOTE — Telephone Encounter (Signed)
Follow up on 05/06/2017

## 2017-05-06 ENCOUNTER — Ambulatory Visit (INDEPENDENT_AMBULATORY_CARE_PROVIDER_SITE_OTHER): Payer: Self-pay | Admitting: Primary Care

## 2017-05-06 ENCOUNTER — Encounter: Payer: Self-pay | Admitting: Primary Care

## 2017-05-06 VITALS — BP 140/82 | HR 104 | Temp 98.1°F | Ht 66.0 in | Wt 143.8 lb

## 2017-05-06 DIAGNOSIS — K047 Periapical abscess without sinus: Secondary | ICD-10-CM

## 2017-05-06 DIAGNOSIS — I10 Essential (primary) hypertension: Secondary | ICD-10-CM

## 2017-05-06 MED ORDER — AMOXICILLIN 875 MG PO TABS: 875.0000 mg | ORAL_TABLET | Freq: Two times a day (BID) | ORAL | 0 refills | Status: DC

## 2017-05-06 NOTE — Progress Notes (Signed)
Subjective:    Patient ID: Maureen Orozco, female    DOB: 02-21-1966, 51 y.o.   MRN: [id number]  HPI  Maureen Orozco is a 51 year old female who presents today for follow up.   Currently managed on lisinopril-HCTZ 20-25 mg and Amlodipine 10 mg. During her last visit she endorsed being without her medications for 2-3 months so she was re-initiated during her last visit.    Since her last visit she's been compliant to her medication. She's not checking her blood pressure at home. She denies chest pain, dizziness, shortness of breath.   BP Readings from Last 3 Encounters:  05/06/17 140/82  03/20/17 (!) 154/84  08/20/16 (!) 148/100    2) Dental Pain: She also reports a left molar tooth pain that has been present for the past 1 week. She's also noticed clear drainage, left sided facial swelling. She denies fevers. She has not seen a dentist as she cannot afford.   Review of Systems  Constitutional: Negative for fever.  HENT:       Dental pain  Eyes: Negative for visual disturbance.  Respiratory: Negative for shortness of breath.   Cardiovascular: Negative for chest pain.  Neurological: Negative for dizziness.       Past Medical History:  Diagnosis Date  . Essential hypertension   . Hyperlipidemia      Social History   Social History  . Marital status: Married    Spouse name: N/A  . Number of children: N/A  . Years of education: N/A   Occupational History  . Not on file.   Social History Main Topics  . Smoking status: Current Every Day Smoker    Packs/day: 0.50    Types: Cigarettes  . Smokeless tobacco: Never Used  . Alcohol use No  . Drug use: Unknown  . Sexual activity: Not on file   Other Topics Concern  . Not on file   Social History Narrative   Married.   Works at [employer] in [place].   Has one child.   Enjoys playing computer games, watching TV.    No past surgical history on file.  Family History  Problem Relation Age of Onset  . Adopted:  Yes    No Known Allergies  Current Outpatient Prescriptions on File Prior to Visit  Medication Sig Dispense Refill  . amLODipine (NORVASC) 10 MG tablet Take 1 tablet (10 mg total) by mouth daily. For high blood pressure 90 tablet 3  . atorvastatin (LIPITOR) 20 MG tablet Take 1 tablet (20 mg total) by mouth daily. For high cholesterol 90 tablet 3  . lisinopril-hydrochlorothiazide (PRINZIDE,ZESTORETIC) 20-25 MG tablet Take 1 tablet by mouth once daily for high blood pressure. 90 tablet 3  . venlafaxine XR (EFFEXOR XR) 37.5 MG 24 hr capsule Take 1 capsule by mouth every morning for hot flashes. 90 capsule 3   No current facility-administered medications on file prior to visit.     BP 140/82   Pulse (!) 104   Temp 98.1 F (36.7 C) (Oral)   Ht 5\' 6"  (1.676 m)   Wt 143 lb 12.8 oz (65.2 kg)   SpO2 98%   BMI 23.21 kg/m     Objective:   Physical Exam  Constitutional: She appears well-nourished.  HENT:  Mouth/Throat:    Numerous missing teeth to left lower molar side. Mild erythema to site. No drainage or facial swelling.  Neck: Neck supple.  Cardiovascular: Normal rate and regular rhythm.   Pulmonary/Chest: Effort  normal and breath sounds normal.  Skin: Skin is warm and dry.          Assessment & Plan:  Dental Pain:  Located to left lower molar. Exam today without evidence of wide spread infection. Given symptoms and lack of dental care, will treat. Rx for Amoxil course sent to pharmacy. She will establish with a dentist as soon as possible.  Maureen Flow, NP

## 2017-05-06 NOTE — Patient Instructions (Signed)
Check your blood pressure daily, around the same time of day, for the next 2 weeks.  Ensure that you have rested for 30 minutes prior to checking your blood pressure. Record your readings and message me in 2 weeks.  It was a pleasure to see you today!   DASH Eating Plan DASH stands for "Dietary Approaches to Stop Hypertension." The DASH eating plan is a healthy eating plan that has been shown to reduce high blood pressure (hypertension). It may also reduce your risk for type 2 diabetes, heart disease, and stroke. The DASH eating plan may also help with weight loss. What are tips for following this plan? General guidelines  Avoid eating more than 2,300 mg (milligrams) of salt (sodium) a day. If you have hypertension, you may need to reduce your sodium intake to 1,500 mg a day.  Limit alcohol intake to no more than 1 drink a day for nonpregnant women and 2 drinks a day for men. One drink equals 12 oz of beer, 5 oz of wine, or 1 oz of hard liquor.  Work with your health care provider to maintain a healthy body weight or to lose weight. Ask what an ideal weight is for you.  Get at least 30 minutes of exercise that causes your heart to beat faster (aerobic exercise) most days of the week. Activities may include walking, swimming, or biking.  Work with your health care provider or diet and nutrition specialist (dietitian) to adjust your eating plan to your individual calorie needs. Reading food labels  Check food labels for the amount of sodium per serving. Choose foods with less than 5 percent of the Daily Value of sodium. Generally, foods with less than 300 mg of sodium per serving fit into this eating plan.  To find whole grains, look for the word "whole" as the first word in the ingredient list. Shopping  Buy products labeled as "low-sodium" or "no salt added."  Buy fresh foods. Avoid canned foods and premade or frozen meals. Cooking  Avoid adding salt when cooking. Use salt-free  seasonings or herbs instead of table salt or sea salt. Check with your health care provider or pharmacist before using salt substitutes.  Do not fry foods. Cook foods using healthy methods such as baking, boiling, grilling, and broiling instead.  Cook with heart-healthy oils, such as olive, canola, soybean, or sunflower oil. Meal planning   Eat a balanced diet that includes: ? 5 or more servings of fruits and vegetables each day. At each meal, try to fill half of your plate with fruits and vegetables. ? Up to 6-8 servings of whole grains each day. ? Less than 6 oz of lean meat, poultry, or fish each day. A 3-oz serving of meat is about the same size as a deck of cards. One egg equals 1 oz. ? 2 servings of low-fat dairy each day. ? A serving of nuts, seeds, or beans 5 times each week. ? Heart-healthy fats. Healthy fats called Omega-3 fatty acids are found in foods such as flaxseeds and coldwater fish, like sardines, salmon, and mackerel.  Limit how much you eat of the following: ? Canned or prepackaged foods. ? Food that is high in trans fat, such as fried foods. ? Food that is high in saturated fat, such as fatty meat. ? Sweets, desserts, sugary drinks, and other foods with added sugar. ? Full-fat dairy products.  Do not salt foods before eating.  Try to eat at least 2 vegetarian meals each week.  Eat more home-cooked food and less restaurant, buffet, and fast food.  When eating at a restaurant, ask that your food be prepared with less salt or no salt, if possible. What foods are recommended? The items listed may not be a complete list. Talk with your dietitian about what dietary choices are best for you. Grains Whole-grain or whole-wheat bread. Whole-grain or whole-wheat pasta. Brown rice. [place]. Bulgur. Whole-grain and low-sodium cereals. Pita bread. Low-fat, low-sodium crackers. Whole-wheat flour tortillas. Vegetables Fresh or frozen vegetables (raw, steamed, roasted,  or grilled). Low-sodium or reduced-sodium tomato and vegetable juice. Low-sodium or reduced-sodium tomato sauce and tomato paste. Low-sodium or reduced-sodium canned vegetables. Fruits All fresh, dried, or frozen fruit. Canned fruit in natural juice (without added sugar). Meat and other protein foods Skinless chicken or [place]. Ground chicken or [place]. Pork with fat trimmed off. Fish and seafood. Egg whites. Dried beans, peas, or lentils. Unsalted nuts, nut butters, and seeds. Unsalted canned beans. Lean cuts of beef with fat trimmed off. Low-sodium, lean deli meat. Dairy Low-fat (1%) or fat-free (skim) milk. Fat-free, low-fat, or reduced-fat cheeses. Nonfat, low-sodium ricotta or cottage cheese. Low-fat or nonfat yogurt. Low-fat, low-sodium cheese. Fats and oils Soft margarine without trans fats. Vegetable oil. Low-fat, reduced-fat, or light mayonnaise and salad dressings (reduced-sodium). Canola, safflower, olive, soybean, and sunflower oils. Avocado. Seasoning and other foods Herbs. Spices. Seasoning mixes without salt. Unsalted popcorn and pretzels. Fat-free sweets. What foods are not recommended? The items listed may not be a complete list. Talk with your dietitian about what dietary choices are best for you. Grains Baked goods made with fat, such as croissants, muffins, or some breads. Dry pasta or rice meal packs. Vegetables Creamed or fried vegetables. Vegetables in a cheese sauce. Regular canned vegetables (not low-sodium or reduced-sodium). Regular canned tomato sauce and paste (not low-sodium or reduced-sodium). Regular tomato and vegetable juice (not low-sodium or reduced-sodium). Angie Fava. Olives. Fruits Canned fruit in a light or heavy syrup. Fried fruit. Fruit in cream or butter sauce. Meat and other protein foods Fatty cuts of meat. Ribs. Fried meat. Berniece Salines. Sausage. Bologna and other processed lunch meats. Salami. Fatback. Hotdogs. Bratwurst. Salted nuts and seeds. Canned beans  with added salt. Canned or smoked fish. Whole eggs or egg yolks. Chicken or [place] with skin. Dairy Whole or 2% milk, cream, and half-and-half. Whole or full-fat cream cheese. Whole-fat or sweetened yogurt. Full-fat cheese. Nondairy creamers. Whipped toppings. Processed cheese and cheese spreads. Fats and oils Butter. Stick margarine. Lard. Shortening. Ghee. Bacon fat. Tropical oils, such as coconut, palm kernel, or palm oil. Seasoning and other foods Salted popcorn and pretzels. Onion salt, garlic salt, seasoned salt, table salt, and sea salt. Worcestershire sauce. Tartar sauce. Barbecue sauce. Teriyaki sauce. Soy sauce, including reduced-sodium. Steak sauce. Canned and packaged gravies. Fish sauce. Oyster sauce. Cocktail sauce. Horseradish that you find on the shelf. Ketchup. Mustard. Meat flavorings and tenderizers. Bouillon cubes. Hot sauce and Tabasco sauce. Premade or packaged marinades. Premade or packaged taco seasonings. Relishes. Regular salad dressings. Where to find more information:  National Heart, Lung, and [place]: https://wilson-eaton.com/  American Heart Association: www.heart.org Summary  The DASH eating plan is a healthy eating plan that has been shown to reduce high blood pressure (hypertension). It may also reduce your risk for type 2 diabetes, heart disease, and stroke.  With the DASH eating plan, you should limit salt (sodium) intake to 2,300 mg a day. If you have hypertension, you may need to reduce your sodium intake  to 1,500 mg a day.  When on the DASH eating plan, aim to eat more fresh fruits and vegetables, whole grains, lean proteins, low-fat dairy, and heart-healthy fats.  Work with your health care provider or diet and nutrition specialist (dietitian) to adjust your eating plan to your individual calorie needs. This information is not intended to replace advice given to you by your health care provider. Make sure you discuss any questions you have with your health  care provider. Document Released: 06/21/2011 Document Revised: 06/25/2016 Document Reviewed: 06/25/2016 Elsevier Interactive Patient Education  2017 [employer].

## 2017-05-06 NOTE — Assessment & Plan Note (Signed)
Will have her monitor BP at home for next 2 weeks. If above goal then will need to increase Lisinopril to 40 mg. Continue current regimen for now.

## 2017-05-28 ENCOUNTER — Other Ambulatory Visit: Payer: Self-pay | Admitting: Primary Care

## 2017-05-28 DIAGNOSIS — E785 Hyperlipidemia, unspecified: Secondary | ICD-10-CM

## 2017-06-05 ENCOUNTER — Other Ambulatory Visit (INDEPENDENT_AMBULATORY_CARE_PROVIDER_SITE_OTHER): Payer: Self-pay

## 2017-06-05 DIAGNOSIS — E785 Hyperlipidemia, unspecified: Secondary | ICD-10-CM

## 2017-06-05 LAB — LIPID PANEL
Cholesterol: 184 mg/dL (ref 0–200)
HDL: 52.9 mg/dL (ref >39.00)
LDL Cholesterol: 116 mg/dL — ABNORMAL HIGH (ref 0–99)
NONHDL: 131.52
Total CHOL/HDL Ratio: 3
Triglycerides: 78 mg/dL (ref 0.0–149.0)
VLDL: 15.6 mg/dL (ref 0.0–40.0)

## 2017-09-11 ENCOUNTER — Encounter: Payer: Self-pay | Admitting: Primary Care

## 2018-06-19 DIAGNOSIS — E785 Hyperlipidemia, unspecified: Secondary | ICD-10-CM

## 2018-06-19 DIAGNOSIS — I10 Essential (primary) hypertension: Secondary | ICD-10-CM

## 2018-06-19 DIAGNOSIS — N951 Menopausal and female climacteric states: Secondary | ICD-10-CM

## 2018-06-20 MED ORDER — ATORVASTATIN CALCIUM 20 MG PO TABS: 20.0000 mg | ORAL_TABLET | Freq: Every day | ORAL | 0 refills | Status: DC

## 2018-06-20 MED ORDER — VENLAFAXINE HCL ER 37.5 MG PO CP24
ORAL_CAPSULE | ORAL | 0 refills | Status: DC
Start: 2018-06-20 — End: 2018-07-28

## 2018-06-20 MED ORDER — LISINOPRIL-HYDROCHLOROTHIAZIDE 20-25 MG PO TABS: ORAL_TABLET | ORAL | 0 refills | Status: DC

## 2018-06-20 MED ORDER — AMLODIPINE BESYLATE 10 MG PO TABS: 10.0000 mg | ORAL_TABLET | Freq: Every day | ORAL | 0 refills | Status: DC

## 2018-07-24 ENCOUNTER — Ambulatory Visit: Payer: Self-pay | Admitting: Primary Care

## 2018-07-25 ENCOUNTER — Ambulatory Visit: Payer: Self-pay | Admitting: Primary Care

## 2018-07-28 ENCOUNTER — Encounter: Payer: Self-pay | Admitting: Primary Care

## 2018-07-28 ENCOUNTER — Ambulatory Visit: Payer: BLUE CROSS/BLUE SHIELD | Admitting: Primary Care

## 2018-07-28 VITALS — BP 138/86 | HR 101 | Temp 98.4°F | Wt 153.5 lb

## 2018-07-28 DIAGNOSIS — Z23 Encounter for immunization: Secondary | ICD-10-CM

## 2018-07-28 DIAGNOSIS — Z1239 Encounter for other screening for malignant neoplasm of breast: Secondary | ICD-10-CM

## 2018-07-28 DIAGNOSIS — N951 Menopausal and female climacteric states: Secondary | ICD-10-CM

## 2018-07-28 DIAGNOSIS — I10 Essential (primary) hypertension: Secondary | ICD-10-CM | POA: Diagnosis not present

## 2018-07-28 DIAGNOSIS — Z720 Tobacco use: Secondary | ICD-10-CM

## 2018-07-28 DIAGNOSIS — E785 Hyperlipidemia, unspecified: Secondary | ICD-10-CM | POA: Diagnosis not present

## 2018-07-28 DIAGNOSIS — R7303 Prediabetes: Secondary | ICD-10-CM | POA: Diagnosis not present

## 2018-07-28 LAB — COMPREHENSIVE METABOLIC PANEL
ALBUMIN: 4.4 g/dL (ref 3.5–5.2)
ALK PHOS: 73 U/L (ref 39–117)
ALT: 9 U/L (ref 0–35)
AST: 14 U/L (ref 0–37)
BUN: 14 mg/dL (ref 6–23)
CO2: 25 mEq/L (ref 19–32)
CREATININE: 0.87 mg/dL (ref 0.40–1.20)
Calcium: 9.7 mg/dL (ref 8.4–10.5)
Chloride: 108 mEq/L (ref 96–112)
GFR: 73.17 mL/min (ref >60.00)
Glucose, Bld: 107 mg/dL — ABNORMAL HIGH (ref 70–99)
Potassium: 3.7 mEq/L (ref 3.5–5.1)
SODIUM: 141 meq/L (ref 135–145)
Total Bilirubin: 0.4 mg/dL (ref 0.2–1.2)
Total Protein: 7.3 g/dL (ref 6.0–8.3)

## 2018-07-28 LAB — LIPID PANEL
Cholesterol: 181 mg/dL (ref 0–200)
HDL: 53.4 mg/dL (ref >39.00)
LDL Cholesterol: 112 mg/dL — ABNORMAL HIGH (ref 0–99)
NONHDL: 127.33
Total CHOL/HDL Ratio: 3
Triglycerides: 75 mg/dL (ref 0.0–149.0)
VLDL: 15 mg/dL (ref 0.0–40.0)

## 2018-07-28 LAB — HEMOGLOBIN A1C: HEMOGLOBIN A1C: 5.9 % (ref 4.6–6.5)

## 2018-07-28 MED ORDER — AMLODIPINE BESYLATE 10 MG PO TABS: 10.0000 mg | ORAL_TABLET | Freq: Every day | ORAL | 3 refills | Status: DC

## 2018-07-28 MED ORDER — VENLAFAXINE HCL ER 37.5 MG PO CP24: ORAL_CAPSULE | ORAL | 3 refills | Status: DC

## 2018-07-28 MED ORDER — LISINOPRIL-HYDROCHLOROTHIAZIDE 20-25 MG PO TABS: ORAL_TABLET | ORAL | 3 refills | Status: DC

## 2018-07-28 MED ORDER — ATORVASTATIN CALCIUM 20 MG PO TABS: 20.0000 mg | ORAL_TABLET | Freq: Every day | ORAL | 3 refills | Status: DC

## 2018-07-28 NOTE — Assessment & Plan Note (Signed)
Out of medications for one week, BP borderline high today. Refills provided for medications, recommended she start monitoring. BMP pending.

## 2018-07-28 NOTE — Addendum Note (Signed)
Addended by: Lendon Collar on: 07/28/2018 11:53 AM   Modules accepted: Orders

## 2018-07-28 NOTE — Assessment & Plan Note (Signed)
Out of medications for one week, refill for atorvastatin sent to pharmacy. Repeat lipids in 6 weeks.

## 2018-07-28 NOTE — Assessment & Plan Note (Signed)
Continues to smoke, not ready to quit.  Encouraged her to work on cessation and to notify me if she needs assistance.

## 2018-07-28 NOTE — Addendum Note (Signed)
Addended by: Tammi Sou on: 07/28/2018 12:19 PM   Modules accepted: Orders

## 2018-07-28 NOTE — Assessment & Plan Note (Signed)
Repeat A1C pending. Recommended healthy diet, regular exercise.

## 2018-07-28 NOTE — Assessment & Plan Note (Signed)
Doing well on venlafaxine Er 37.5 mg, continue same.  Refills sent to pharmacy.

## 2018-07-28 NOTE — Progress Notes (Signed)
Subjective:    Patient ID: Maureen Orozco, female    DOB: 07/08/1966, 53 y.o.   MRN: [id number]  HPI  Maureen Orozco is a 53 year old female who presents today for follow up and medication refill. She is also due for mammogram and screening colonoscopy. She declines colonoscopy for now.  1) Essential Hypertension: Currently managed on amlodipine 10 mg, lisinopril-HCTZ 20-25 mg. She ran out of her medication one week ago. She's not checking her BP at home.   BP Readings from Last 3 Encounters:  07/28/18 138/86  05/06/17 140/82  03/20/17 (!) 154/84   2) Hyperlipidemia: Currently managed on atorvastatin 20 mg. Her last lipid panel was in 2018 with LDL of 116. She ran out of her cholesterol medication last week.   3) Vasomotor Symptoms: Currently managed on venlafaxine ER 37.5 mg. Overall she is doing well on her medication.   Review of Systems  Respiratory: Negative for shortness of breath.   Cardiovascular: Negative for chest pain.  Gastrointestinal:       Epigastric burning and pressure after meals  Neurological: Negative for dizziness and headaches.  Psychiatric/Behavioral: Negative for suicidal ideas.       Past Medical History:  Diagnosis Date  . Essential hypertension   . Hyperlipidemia      Social History   Socioeconomic History  . Marital status: Married    Spouse name: Not on file  . Number of children: Not on file  . Years of education: Not on file  . Highest education level: Not on file  Occupational History  . Not on file  Social Needs  . Financial resource strain: Not on file  . Food insecurity:    Worry: Not on file    Inability: Not on file  . Transportation needs:    Medical: Not on file    Non-medical: Not on file  Tobacco Use  . Smoking status: Current Every Day Smoker    Packs/day: 0.50    Types: Cigarettes  . Smokeless tobacco: Never Used  Substance and Sexual Activity  . Alcohol use: No    Alcohol/week: 0.0 standard drinks  . Drug use:  Not on file  . Sexual activity: Not on file  Lifestyle  . Physical activity:    Days per week: Not on file    Minutes per session: Not on file  . Stress: Not on file  Relationships  . Social connections:    Talks on phone: Not on file    Gets together: Not on file    Attends religious service: Not on file    Active member of club or organization: Not on file    Attends meetings of clubs or organizations: Not on file    Relationship status: Not on file  . Intimate partner violence:    Fear of current or ex partner: Not on file    Emotionally abused: Not on file    Physically abused: Not on file    Forced sexual activity: Not on file  Other Topics Concern  . Not on file  Social History Narrative   Married.   Works at [employer] in [place].   Has one child.   Enjoys playing computer games, watching TV.    No past surgical history on file.  Family History  Adopted: Yes    No Known Allergies  Current Outpatient Medications on File Prior to Visit  Medication Sig Dispense Refill  . amLODipine (NORVASC) 10 MG tablet Take 1 tablet (10  mg total) by mouth daily. For high blood pressure 30 tablet 0  . atorvastatin (LIPITOR) 20 MG tablet Take 1 tablet (20 mg total) by mouth daily. For high cholesterol 30 tablet 0  . lisinopril-hydrochlorothiazide (PRINZIDE,ZESTORETIC) 20-25 MG tablet Take 1 tablet by mouth once daily for high blood pressure. 30 tablet 0  . venlafaxine XR (EFFEXOR XR) 37.5 MG 24 hr capsule Take 1 capsule by mouth every morning for hot flashes. 30 capsule 0   No current facility-administered medications on file prior to visit.     BP 138/86 (BP Location: Right Arm, Patient Position: Sitting, Cuff Size: Normal)   Pulse (!) 101   Temp 98.4 F (36.9 C) (Oral)   Wt 153 lb 8 oz (69.6 kg)   SpO2 97%   BMI 24.78 kg/m    Objective:   Physical Exam  Constitutional: She appears well-nourished.  Neck: Neck supple.  Cardiovascular: Normal rate and regular rhythm.   Respiratory: Effort normal and breath sounds normal.  Skin: Skin is warm and dry.  Psychiatric: She has a normal mood and affect.           Assessment & Plan:

## 2018-07-28 NOTE — Patient Instructions (Signed)
Stop by the lab prior to leaving today. I will notify you of your results once received.   I sent refills of your medications to the pharmacy.  Start monitoring your blood pressure 1-2 times weekly, around the same time of day.  Ensure that you have rested for 30 minutes prior to checking your blood pressure. Record your readings and notify me if you see readings at or above 135/90.  Schedule a lab only appointment for 6 weeks to recheck your cholesterol. No food four hours prior, you can have water.  Call the breast center to schedule your mammogram.  You are due for a screening colonoscopy, please let me know once you've decided. I do recommend this.  It was a pleasure to see you today!

## 2018-09-02 ENCOUNTER — Other Ambulatory Visit: Payer: Self-pay | Admitting: Primary Care

## 2018-09-02 DIAGNOSIS — E785 Hyperlipidemia, unspecified: Secondary | ICD-10-CM

## 2018-09-08 ENCOUNTER — Other Ambulatory Visit (INDEPENDENT_AMBULATORY_CARE_PROVIDER_SITE_OTHER): Payer: BLUE CROSS/BLUE SHIELD

## 2018-09-08 DIAGNOSIS — E785 Hyperlipidemia, unspecified: Secondary | ICD-10-CM

## 2018-09-08 LAB — LIPID PANEL
CHOL/HDL RATIO: 3
Cholesterol: 148 mg/dL (ref 0–200)
HDL: 50.1 mg/dL (ref >39.00)
LDL Cholesterol: 85 mg/dL (ref 0–99)
NONHDL: 98.13
TRIGLYCERIDES: 68 mg/dL (ref 0.0–149.0)
VLDL: 13.6 mg/dL (ref 0.0–40.0)

## 2018-09-26 ENCOUNTER — Other Ambulatory Visit: Payer: Self-pay

## 2018-09-26 ENCOUNTER — Encounter: Payer: Self-pay | Admitting: Primary Care

## 2018-09-26 ENCOUNTER — Ambulatory Visit (INDEPENDENT_AMBULATORY_CARE_PROVIDER_SITE_OTHER): Payer: BLUE CROSS/BLUE SHIELD | Admitting: Primary Care

## 2018-09-26 VITALS — BP 140/86 | HR 104 | Temp 98.2°F | Ht 69.0 in | Wt 153.2 lb

## 2018-09-26 DIAGNOSIS — Z1211 Encounter for screening for malignant neoplasm of colon: Secondary | ICD-10-CM | POA: Diagnosis not present

## 2018-09-26 DIAGNOSIS — Z124 Encounter for screening for malignant neoplasm of cervix: Secondary | ICD-10-CM | POA: Diagnosis not present

## 2018-09-26 DIAGNOSIS — I10 Essential (primary) hypertension: Secondary | ICD-10-CM

## 2018-09-26 DIAGNOSIS — Z0000 Encounter for general adult medical examination without abnormal findings: Secondary | ICD-10-CM

## 2018-09-26 DIAGNOSIS — N951 Menopausal and female climacteric states: Secondary | ICD-10-CM

## 2018-09-26 DIAGNOSIS — E785 Hyperlipidemia, unspecified: Secondary | ICD-10-CM

## 2018-09-26 DIAGNOSIS — R87611 Atypical squamous cells cannot exclude high grade squamous intraepithelial lesion on cytologic smear of cervix (ASC-H): Secondary | ICD-10-CM | POA: Insufficient documentation

## 2018-09-26 MED ORDER — HYDROCHLOROTHIAZIDE 25 MG PO TABS: 25.0000 mg | ORAL_TABLET | Freq: Every day | ORAL | 3 refills | Status: DC

## 2018-09-26 MED ORDER — LISINOPRIL 40 MG PO TABS: 40.0000 mg | ORAL_TABLET | Freq: Every day | ORAL | 3 refills | Status: DC

## 2018-09-26 NOTE — Assessment & Plan Note (Signed)
Above goal in the office today, also on numerous visits and with home readings. Stop lisinopril-HCTZ 20-25 mg.  Start lisinopril 40 mg once daily, start HCTZ 25 mg once daily, continue Amlodipine.  She will monitor home BP readings and we will call for those readings in 2 weeks. BMP from January unremarkable.

## 2018-09-26 NOTE — Assessment & Plan Note (Signed)
Immunizations UTD. Mammogram pending. Colonoscopy due, orders placed. Pap smear due and completed today. Exam unremarkable. Labs reviewed. Follow up in 1 year for CPE.

## 2018-09-26 NOTE — Assessment & Plan Note (Signed)
Doing well on venlafaxine, continue same.  

## 2018-09-26 NOTE — Progress Notes (Signed)
Subjective:    Patient ID: Maureen Orozco, female    DOB: [birth year], 53 y.o.   MRN: [id number]  HPI  [name] is a 53 year old female who presents today for complete physical.  Immunizations: -Tetanus: Completed in 2016 -Influenza: Completed this season   Diet: She endorses a fair diet Breakfast: Skips Lunch: Chips, crackers Dinner: Fast food, restaurants Snacks: Candy Desserts: 2 times weekly  Beverages: Coke, little water  Exercise: She is not exercising  Eye exam: Completed several years ago Dental exam: No recent exam Colonoscopy: Never completed, due Pap Smear: Completed in July 2016 Mammogram: Completed in 2017, pending.  BP Readings from Last 3 Encounters:  09/26/18 140/86  07/28/18 138/86  05/06/17 140/82     Review of Systems  Constitutional: Negative for unexpected weight change.  HENT: Negative for rhinorrhea.   Respiratory: Negative for cough and shortness of breath.   Cardiovascular: Negative for chest pain.  Gastrointestinal: Negative for constipation and diarrhea.  Genitourinary: Negative for difficulty urinating.  Musculoskeletal: Negative for arthralgias and myalgias.  Skin: Negative for rash.  Allergic/Immunologic: Negative for environmental allergies.  Neurological: Negative for dizziness, numbness and headaches.  Psychiatric/Behavioral: The patient is not nervous/anxious.        Past Medical History:  Diagnosis Date  . Essential hypertension   . Hyperlipidemia      Social History   Socioeconomic History  . Marital status: Married    Spouse name: Not on file  . Number of children: Not on file  . Years of education: Not on file  . Highest education level: Not on file  Occupational History  . Not on file  Social Needs  . Financial resource strain: Not on file  . Food insecurity:    Worry: Not on file    Inability: Not on file  . Transportation needs:    Medical: Not on file    Non-medical: Not on file  Tobacco Use  .  Smoking status: Current Every Day Smoker    Packs/day: 0.50    Types: Cigarettes  . Smokeless tobacco: Never Used  Substance and Sexual Activity  . Alcohol use: No    Alcohol/week: 0.0 standard drinks  . Drug use: Not on file  . Sexual activity: Not on file  Lifestyle  . Physical activity:    Days per week: Not on file    Minutes per session: Not on file  . Stress: Not on file  Relationships  . Social connections:    Talks on phone: Not on file    Gets together: Not on file    Attends religious service: Not on file    Active member of club or organization: Not on file    Attends meetings of clubs or organizations: Not on file    Relationship status: Not on file  . Intimate partner violence:    Fear of current or ex partner: Not on file    Emotionally abused: Not on file    Physically abused: Not on file    Forced sexual activity: Not on file  Other Topics Concern  . Not on file  Social History Narrative   Married.   Works at [employer] in [place].   Has one child.   Enjoys playing computer games, watching TV.    No past surgical history on file.  Family History  Adopted: Yes    No Known Allergies  Current Outpatient Medications on File Prior to Visit  Medication Sig Dispense Refill  .  amLODipine (NORVASC) 10 MG tablet Take 1 tablet (10 mg total) by mouth daily. For high blood pressure 90 tablet 3  . atorvastatin (LIPITOR) 20 MG tablet Take 1 tablet (20 mg total) by mouth daily. For high cholesterol 90 tablet 3  . lisinopril-hydrochlorothiazide (PRINZIDE,ZESTORETIC) 20-25 MG tablet Take 1 tablet by mouth once daily for high blood pressure. 90 tablet 3  . venlafaxine XR (EFFEXOR XR) 37.5 MG 24 hr capsule Take 1 capsule by mouth every morning for hot flashes. 90 capsule 3   No current facility-administered medications on file prior to visit.     BP 140/86   Pulse (!) 104   Temp 98.2 F (36.8 C) (Oral)   Ht 5\' 9"  (1.753 m)   Wt 153 lb 4 oz (69.5 kg)   SpO2  97%   BMI 22.63 kg/m    Objective:   Physical Exam  Constitutional: She is oriented to person, place, and time. She appears well-nourished.  HENT:  Mouth/Throat: No oropharyngeal exudate.  Eyes: Pupils are equal, round, and reactive to light. EOM are normal.  Neck: Neck supple. No thyromegaly present.  Cardiovascular: Normal rate and regular rhythm.  Respiratory: Effort normal and breath sounds normal.  GI: Soft. Bowel sounds are normal. There is no abdominal tenderness.  Genitourinary: There is no tenderness or lesion on the right labia. There is no tenderness or lesion on the left labia. Cervix exhibits no motion tenderness and no discharge. Right adnexum displays no tenderness. Left adnexum displays no tenderness.    No vaginal discharge or erythema.  No erythema in the vagina.  Musculoskeletal: Normal range of motion.  Neurological: She is alert and oriented to person, place, and time.  Skin: Skin is warm and dry.  Psychiatric: She has a normal mood and affect.           Assessment & Plan:

## 2018-09-26 NOTE — Patient Instructions (Addendum)
We've increased the dose of your lisinopril to 40 mg. I sent a new prescription to your pharmacy.  Stop lisinopril-hydrochlorothiazide 20-25 mg.   Start lisinopril 40 mg once daily for blood pressure. Start hydrochlorothiazide 25 mg once daily for blood pressure.  Continue your other medications.  Start exercising. You should be getting 150 minutes of moderate intensity exercise weekly.  It's important to improve your diet by reducing consumption of fast food, fried food, processed snack foods, sugary drinks. Increase consumption of fresh vegetables and fruits, whole grains, water.  Ensure you are drinking 64 ounces of water daily.  You will be contacted regarding your referral to GI for the colonosocpy. Please let us know if you have not been contacted within one week.   Monitor your blood pressure for me over the next 2 weeks, we will call you for readings.  It was a pleasure to see you today!   Preventive Care 40-64 Years, Female Preventive care refers to lifestyle choices and visits with your health care provider that can promote health and wellness. What does preventive care include?   A yearly physical exam. This is also called an annual well check.  Dental exams once or twice a year.  Routine eye exams. Ask your health care provider how often you should have your eyes checked.  Personal lifestyle choices, including: ? Daily care of your teeth and gums. ? Regular physical activity. ? Eating a healthy diet. ? Avoiding tobacco and drug use. ? Limiting alcohol use. ? Practicing safe sex. ? Taking low-dose aspirin daily starting at age 26. ? Taking vitamin and mineral supplements as recommended by your health care provider. What happens during an annual well check? The services and screenings done by your health care provider during your annual well check will depend on your age, overall health, lifestyle risk factors, and family history of disease. Counseling Your  health care provider may ask you questions about your:  Alcohol use.  Tobacco use.  Drug use.  Emotional well-being.  Home and relationship well-being.  Sexual activity.  Eating habits.  Work and work Statistician.  Method of birth control.  Menstrual cycle.  Pregnancy history. Screening You may have the following tests or measurements:  Height, weight, and BMI.  Blood pressure.  Lipid and cholesterol levels. These may be checked every 5 years, or more frequently if you are over 55 years old.  Skin check.  Lung cancer screening. You may have this screening every year starting at age 9 if you have a 30-pack-year history of smoking and currently smoke or have quit within the past 15 years.  Colorectal cancer screening. All adults should have this screening starting at age 40 and continuing until age 72. Your health care provider may recommend screening at age 68. You will have tests every 1-10 years, depending on your results and the type of screening test. People at increased risk should start screening at an earlier age. Screening tests may include: ? Guaiac-based fecal occult blood testing. ? Fecal immunochemical test (FIT). ? Stool DNA test. ? Virtual colonoscopy. ? Sigmoidoscopy. During this test, a flexible tube with a tiny camera (sigmoidoscope) is used to examine your rectum and lower colon. The sigmoidoscope is inserted through your anus into your rectum and lower colon. ? Colonoscopy. During this test, a long, thin, flexible tube with a tiny camera (colonoscope) is used to examine your entire colon and rectum.  Hepatitis C blood test.  Hepatitis B blood test.  Sexually transmitted disease (  STD) testing.  Diabetes screening. This is done by checking your blood sugar (glucose) after you have not eaten for a while (fasting). You may have this done every 1-3 years.  Mammogram. This may be done every 1-2 years. Talk to your health care provider about when you  should start having regular mammograms. This may depend on whether you have a family history of breast cancer.  BRCA-related cancer screening. This may be done if you have a family history of breast, ovarian, tubal, or peritoneal cancers.  Pelvic exam and Pap test. This may be done every 3 years starting at age 20. Starting at age 60, this may be done every 5 years if you have a Pap test in combination with an HPV test.  Bone density scan. This is done to screen for osteoporosis. You may have this scan if you are at high risk for osteoporosis. Discuss your test results, treatment options, and if necessary, the need for more tests with your health care provider. Vaccines Your health care provider may recommend certain vaccines, such as:  Influenza vaccine. This is recommended every year.  Tetanus, diphtheria, and acellular pertussis (Tdap, Td) vaccine. You may need a Td booster every 10 years.  Varicella vaccine. You may need this if you have not been vaccinated.  Zoster vaccine. You may need this after age 5.  Measles, mumps, and rubella (MMR) vaccine. You may need at least one dose of MMR if you were born in 1957 or later. You may also need a second dose.  Pneumococcal 13-valent conjugate (PCV13) vaccine. You may need this if you have certain conditions and were not previously vaccinated.  Pneumococcal polysaccharide (PPSV23) vaccine. You may need one or two doses if you smoke cigarettes or if you have certain conditions.  Meningococcal vaccine. You may need this if you have certain conditions.  Hepatitis A vaccine. You may need this if you have certain conditions or if you travel or work in places where you may be exposed to hepatitis A.  Hepatitis B vaccine. You may need this if you have certain conditions or if you travel or work in places where you may be exposed to hepatitis B.  Haemophilus influenzae type b (Hib) vaccine. You may need this if you have certain conditions. Talk  to your health care provider about which screenings and vaccines you need and how often you need them. This information is not intended to replace advice given to you by your health care provider. Make sure you discuss any questions you have with your health care provider. Document Released: 07/29/2015 Document Revised: 08/22/2017 Document Reviewed: 05/03/2015 Elsevier Interactive Patient Education  2019 [employer].

## 2018-09-26 NOTE — Assessment & Plan Note (Signed)
Compliant to atorvastatin, repeat lipids in February unremarkable.

## 2018-10-02 LAB — CYTOLOGY - PAP: HPV: NOT DETECTED

## 2018-10-16 ENCOUNTER — Other Ambulatory Visit: Payer: Self-pay | Admitting: Primary Care

## 2018-10-16 DIAGNOSIS — R87611 Atypical squamous cells cannot exclude high grade squamous intraepithelial lesion on cytologic smear of cervix (ASC-H): Secondary | ICD-10-CM

## 2018-10-28 ENCOUNTER — Encounter: Payer: Self-pay | Admitting: Obstetrics & Gynecology

## 2018-10-28 ENCOUNTER — Encounter: Payer: Self-pay | Admitting: *Deleted

## 2018-10-28 ENCOUNTER — Encounter: Payer: BLUE CROSS/BLUE SHIELD | Admitting: Obstetrics & Gynecology

## 2018-10-28 ENCOUNTER — Other Ambulatory Visit: Payer: Self-pay

## 2018-10-28 ENCOUNTER — Ambulatory Visit (INDEPENDENT_AMBULATORY_CARE_PROVIDER_SITE_OTHER): Payer: BLUE CROSS/BLUE SHIELD | Admitting: Obstetrics & Gynecology

## 2018-10-28 VITALS — BP 155/90 | HR 86 | Wt 154.0 lb

## 2018-10-28 DIAGNOSIS — N870 Mild cervical dysplasia: Secondary | ICD-10-CM

## 2018-10-28 DIAGNOSIS — R87611 Atypical squamous cells cannot exclude high grade squamous intraepithelial lesion on cytologic smear of cervix (ASC-H): Secondary | ICD-10-CM

## 2018-10-28 NOTE — Patient Instructions (Signed)
Colposcopy, Care After  This sheet gives you information about how to care for yourself after your procedure. Your health care provider may also give you more specific instructions. If you have problems or questions, contact your health care provider.  What can I expect after the procedure?  If you had a colposcopy without a biopsy, you can expect to feel fine right away, but you may have some spotting for a few days. You can go back to your normal activities.  If you had a colposcopy with a biopsy, it is common to have:   Soreness and pain. This may last for a few days.   Light-headedness.   Mild vaginal bleeding or dark-colored, grainy discharge. This may last for a few days. The discharge may be due to a solution that was used during the procedure. You may need to wear a sanitary pad during this time.   Spotting for at least 48 hours after the procedure.  Follow these instructions at home:     Take over-the-counter and prescription medicines only as told by your health care provider. Talk with your health care provider about what type of over-the-counter pain medicine and prescription medicine you can start taking again. It is especially important to talk with your health care provider if you take blood-thinning medicine.   Do not drive or use heavy machinery while taking prescription pain medicine.   For at least 3 days after your procedure, or as long as told by your health care provider, avoid:  ? Douching.  ? Using tampons.  ? Having sexual intercourse.   Continue to use birth control (contraception).   Limit your physical activity for the first day after the procedure as told by your health care provider. Ask your health care provider what activities are safe for you.   It is up to you to get the results of your procedure. Ask your health care provider, or the department performing the procedure, when your results will be ready.   Keep all follow-up visits as told by your health care provider.  This is important.  Contact a health care provider if:   You develop a skin rash.  Get help right away if:   You are bleeding heavily from your vagina or you are passing blood clots. This includes using more than one sanitary pad per hour for 2 hours in a row.   You have a fever or chills.   You have pelvic pain.   You have abnormal, yellow-colored, or bad-smelling vaginal discharge. This could be a sign of infection.   You have severe pain or cramps in your lower abdomen that do not get better with medicine.   You feel light-headed or dizzy, or you faint.  Summary   If you had a colposcopy without a biopsy, you can expect to feel fine right away, but you may have some spotting for a few days. You can go back to your normal activities.   If you had a colposcopy with a biopsy, you may notice mild pain and spotting for 48 hours after the procedure.   Avoid douching, using tampons, and having sexual intercourse for 3 days after the procedure or as long as told by your health care provider.   Contact your health care provider if you have bleeding, severe pain, or signs of infection.  This information is not intended to replace advice given to you by your health care provider. Make sure you discuss any questions you have with your   health care provider.  Document Released: 04/22/2013 Document Revised: 02/17/2016 Document Reviewed: 02/17/2016  Elsevier Interactive Patient Education  2019 [employer]

## 2018-10-28 NOTE — Progress Notes (Signed)
    GYNECOLOGY CLINIC COLPOSCOPY PROCEDURE NOTE  53 y.o. [id number] here for colposcopy for ASC cannot exclude high grade lesion [facility]) pap smear on 09/26/18. Discussed implications of cervical dysplasia, need for surveillance.  Patient given informed consent, signed copy in the chart, time out was performed.  Placed in lithotomy position. Cervix viewed with speculum and colposcope after application of acetic acid.   Colposcopy adequate? Yes No visible lesions.  ECC specimen obtained, labeled and sent to pathology.   Patient was given post procedure instructions.  Will follow up pathology and manage accordingly; patient will be contacted with results and recommendations.  Routine preventative health maintenance measures emphasized.  Maureen Schneiders, MD, [place] for [employer], [place]

## 2019-05-13 ENCOUNTER — Other Ambulatory Visit: Payer: Self-pay | Admitting: Primary Care

## 2019-05-13 DIAGNOSIS — E785 Hyperlipidemia, unspecified: Secondary | ICD-10-CM

## 2019-05-13 DIAGNOSIS — N951 Menopausal and female climacteric states: Secondary | ICD-10-CM

## 2019-05-13 DIAGNOSIS — I10 Essential (primary) hypertension: Secondary | ICD-10-CM

## 2019-07-02 ENCOUNTER — Other Ambulatory Visit: Payer: Self-pay

## 2019-07-02 ENCOUNTER — Encounter: Payer: Self-pay | Admitting: Primary Care

## 2019-07-02 ENCOUNTER — Ambulatory Visit: Payer: BLUE CROSS/BLUE SHIELD | Admitting: Primary Care

## 2019-07-02 VITALS — BP 140/80 | HR 88 | Temp 97.0°F | Ht 69.0 in | Wt 159.2 lb

## 2019-07-02 DIAGNOSIS — K0889 Other specified disorders of teeth and supporting structures: Secondary | ICD-10-CM | POA: Diagnosis not present

## 2019-07-02 MED ORDER — AMOXICILLIN-POT CLAVULANATE 875-125 MG PO TABS: 1.0000 | ORAL_TABLET | Freq: Two times a day (BID) | ORAL | 0 refills | Status: DC

## 2019-07-02 MED ORDER — NAPROXEN 500 MG PO TABS: 500.0000 mg | ORAL_TABLET | Freq: Two times a day (BID) | ORAL | 0 refills | Status: DC | PRN

## 2019-07-02 NOTE — Assessment & Plan Note (Signed)
Obvious dental carries with numerous teeth missing. Rx for Augmentin course and naproxen sent to pharmacy. Patient will establish with dentist in early 2021.

## 2019-07-02 NOTE — Patient Instructions (Signed)
Start Augmentin antibiotics for the infection Take 1 tablet by mouth twice daily for 7 days.  Establish care with a dentist.  Switch from Advil to Aleve for pain. Also alternate with Tylenol in between Aleeve doses.  It was a pleasure to see you today!

## 2019-07-02 NOTE — Progress Notes (Signed)
Subjective:    Patient ID: Maureen Orozco, female    DOB: 12-17-1965, 53 y.o.   MRN: [id number]  HPI  Maureen Orozco is a 53 year old female with a history of tobacco abuse, hypertension, hyperlipidemia, prediabetes, dental carries who presents today with a chief complaint of dental pain.  Her pain is located to the right lower molar that began about one week ago. She denies drainage. She will have dental insurance in January 2021 and plans to establish with a dentist at that time.  She has a history of dental carries and will get infections intermittently. She's been taking Ibuprofen and Tylenol without improvement.   Review of Systems  Constitutional: Negative for fever.  HENT: Positive for dental problem.   Respiratory: Negative for cough.        Past Medical History:  Diagnosis Date  . Essential hypertension   . Hyperlipidemia      Social History   Socioeconomic History  . Marital status: Married    Spouse name: Not on file  . Number of children: Not on file  . Years of education: Not on file  . Highest education level: Not on file  Occupational History  . Not on file  Tobacco Use  . Smoking status: Current Every Day Smoker    Packs/day: 0.50    Types: Cigarettes  . Smokeless tobacco: Never Used  Substance and Sexual Activity  . Alcohol use: No    Alcohol/week: 0.0 standard drinks  . Drug use: Not on file  . Sexual activity: Not on file  Other Topics Concern  . Not on file  Social History Narrative   Married.   Works at [employer] in [place].   Has one child.   Enjoys playing computer games, watching TV.   Social Determinants of Health   Financial Resource Strain:   . Difficulty of Paying Living Expenses: Not on file  Food Insecurity:   . Worried About Charity fundraiser in the Last Year: Not on file  . Ran Out of Food in the Last Year: Not on file  Transportation Needs:   . Lack of Transportation (Medical): Not on file  . Lack of Transportation  (Non-Medical): Not on file  Physical Activity:   . Days of Exercise per Week: Not on file  . Minutes of Exercise per Session: Not on file  Stress:   . Feeling of Stress : Not on file  Social Connections:   . Frequency of Communication with Friends and Family: Not on file  . Frequency of Social Gatherings with Friends and Family: Not on file  . Attends Religious Services: Not on file  . Active Member of Clubs or Organizations: Not on file  . Attends Archivist Meetings: Not on file  . Marital Status: Not on file  Intimate Partner Violence:   . Fear of Current or Ex-Partner: Not on file  . Emotionally Abused: Not on file  . Physically Abused: Not on file  . Sexually Abused: Not on file    No past surgical history on file.  Family History  Adopted: Yes    No Known Allergies  Current Outpatient Medications on File Prior to Visit  Medication Sig Dispense Refill  . amLODipine (NORVASC) 10 MG tablet TAKE 1 TABLET BY MOUTH EVERY DAY FOR HIGH BLOOD PRESSURE 90 tablet 1  . atorvastatin (LIPITOR) 20 MG tablet TAKE 1 TABLET BY MOUTH EVERY DAY FOR HIGH CHOLESTEROL 90 tablet 1  . hydrochlorothiazide (HYDRODIURIL)  25 MG tablet Take 1 tablet (25 mg total) by mouth daily. For blood pressure. 90 tablet 3  . lisinopril (PRINIVIL,ZESTRIL) 40 MG tablet Take 1 tablet (40 mg total) by mouth daily. For blood pressure. 90 tablet 3  . venlafaxine XR (EFFEXOR-XR) 37.5 MG 24 hr capsule TAKE 1 CAPSULE BY MOUTH EVERY MORNING FOR HOT FLASHES 90 capsule 1   No current facility-administered medications on file prior to visit.    BP 140/80   Pulse 88   Temp (!) 97 F (36.1 C) (Temporal)   Ht 5\' 9"  (1.753 m)   Wt 159 lb 4 oz (72.2 kg)   SpO2 98%   BMI 23.52 kg/m    Objective:   Physical Exam  HENT:  Mouth/Throat: Oropharynx is clear and moist.  Numerous teeth missing, evidence of carries. Mild swelling to right lower molar region.           Assessment & Plan:

## 2019-07-27 DIAGNOSIS — E785 Hyperlipidemia, unspecified: Secondary | ICD-10-CM

## 2019-07-27 DIAGNOSIS — N951 Menopausal and female climacteric states: Secondary | ICD-10-CM

## 2019-07-27 DIAGNOSIS — I10 Essential (primary) hypertension: Secondary | ICD-10-CM

## 2019-07-27 MED ORDER — AMLODIPINE BESYLATE 10 MG PO TABS: 10.0000 mg | ORAL_TABLET | Freq: Every day | ORAL | 0 refills | Status: DC

## 2019-07-27 MED ORDER — ATORVASTATIN CALCIUM 20 MG PO TABS: ORAL_TABLET | ORAL | 0 refills | Status: DC

## 2019-07-27 NOTE — Telephone Encounter (Signed)
Ok to refill? Last seen on 07/02/2019. Next appointment on 08/07/2019

## 2019-08-07 ENCOUNTER — Encounter: Payer: Self-pay | Admitting: Primary Care

## 2019-08-07 ENCOUNTER — Ambulatory Visit: Payer: BC Managed Care – PPO | Admitting: Primary Care

## 2019-08-07 ENCOUNTER — Other Ambulatory Visit: Payer: Self-pay

## 2019-08-07 VITALS — BP 130/80 | HR 86 | Temp 97.4°F | Ht 69.0 in | Wt 153.8 lb

## 2019-08-07 DIAGNOSIS — R7303 Prediabetes: Secondary | ICD-10-CM | POA: Diagnosis not present

## 2019-08-07 DIAGNOSIS — N951 Menopausal and female climacteric states: Secondary | ICD-10-CM

## 2019-08-07 DIAGNOSIS — I10 Essential (primary) hypertension: Secondary | ICD-10-CM

## 2019-08-07 DIAGNOSIS — Z720 Tobacco use: Secondary | ICD-10-CM

## 2019-08-07 DIAGNOSIS — E785 Hyperlipidemia, unspecified: Secondary | ICD-10-CM

## 2019-08-07 MED ORDER — BUPROPION HCL ER (SR) 100 MG PO TB12: 100.0000 mg | ORAL_TABLET | Freq: Two times a day (BID) | ORAL | 0 refills | Status: DC

## 2019-08-07 NOTE — Progress Notes (Signed)
Subjective:    Patient ID: Maureen Orozco, female    DOB: [birth year], 54 y.o.   MRN: [id number]  HPI  This visit occurred during the SARS-CoV-2 public health emergency.  Safety protocols were in place, including screening questions prior to the visit, additional usage of staff PPE, and extensive cleaning of exam room while observing appropriate contact time as indicated for disinfecting solutions.   Maureen Orozco is a 54 year old female who presents today for medication refills and follow up.  1) Essential Hypertension: Currently managed on amlodipine 10 mg, HCTZ 25 mg, lisinopril 40 mg. She denies chest pain, shortness of breath, dizziness.   BP Readings from Last 3 Encounters:  08/07/19 130/80  07/02/19 140/80  10/28/18 (!) 155/90   2) Hyperlipidemia: Currently managed on atorvastatin 20 mg. Last lipid panel with LDL of 85.  3) Vasomotor Symptoms: Currently managed on venlafaxine XR 37.5 mg. Overall doing well and would like to continue same.  4) Tobacco abuse: Smoker for 20 years, is now smoking 1 pack per day. She is ready to quit and would like to try Wellbutrin again. She has been on Wellbutrin in the past which did help her to cut back but not quit. She's tried OTC products without improvement. She prefers to re try Wellbutrin.  Review of Systems  Eyes: Negative for visual disturbance.  Respiratory: Negative for shortness of breath.   Cardiovascular: Negative for chest pain.  Neurological: Negative for dizziness and headaches.       Past Medical History:  Diagnosis Date  . Essential hypertension   . Hyperlipidemia      Social History   Socioeconomic History  . Marital status: Married    Spouse name: Not on file  . Number of children: Not on file  . Years of education: Not on file  . Highest education level: Not on file  Occupational History  . Not on file  Tobacco Use  . Smoking status: Current Every Day Smoker    Packs/day: 0.50    Types: Cigarettes  .  Smokeless tobacco: Never Used  Substance and Sexual Activity  . Alcohol use: No    Alcohol/week: 0.0 standard drinks  . Drug use: Not on file  . Sexual activity: Not on file  Other Topics Concern  . Not on file  Social History Narrative   Married.   Works at [employer] in [place].   Has one child.   Enjoys playing computer games, watching TV.   Social Determinants of Health   Financial Resource Strain:   . Difficulty of Paying Living Expenses: Not on file  Food Insecurity:   . Worried About Charity fundraiser in the Last Year: Not on file  . Ran Out of Food in the Last Year: Not on file  Transportation Needs:   . Lack of Transportation (Medical): Not on file  . Lack of Transportation (Non-Medical): Not on file  Physical Activity:   . Days of Exercise per Week: Not on file  . Minutes of Exercise per Session: Not on file  Stress:   . Feeling of Stress : Not on file  Social Connections:   . Frequency of Communication with Friends and Family: Not on file  . Frequency of Social Gatherings with Friends and Family: Not on file  . Attends Religious Services: Not on file  . Active Member of Clubs or Organizations: Not on file  . Attends Archivist Meetings: Not on file  . Marital Status:  Not on file  Intimate Partner Violence:   . Fear of Current or Ex-Partner: Not on file  . Emotionally Abused: Not on file  . Physically Abused: Not on file  . Sexually Abused: Not on file    No past surgical history on file.  Family History  Adopted: Yes    No Known Allergies  Current Outpatient Medications on File Prior to Visit  Medication Sig Dispense Refill  . amLODipine (NORVASC) 10 MG tablet Take 1 tablet (10 mg total) by mouth daily. for high blood pressure 90 tablet 0  . atorvastatin (LIPITOR) 20 MG tablet TAKE 1 TABLET BY MOUTH EVERY DAY FOR HIGH CHOLESTEROL 90 tablet 0  . hydrochlorothiazide (HYDRODIURIL) 25 MG tablet Take 1 tablet (25 mg total) by mouth daily.  For blood pressure. 90 tablet 3  . lisinopril (PRINIVIL,ZESTRIL) 40 MG tablet Take 1 tablet (40 mg total) by mouth daily. For blood pressure. 90 tablet 3  . naproxen (NAPROSYN) 500 MG tablet Take 1 tablet (500 mg total) by mouth 2 (two) times daily as needed. For pain. Take with food. 30 tablet 0  . venlafaxine XR (EFFEXOR-XR) 37.5 MG 24 hr capsule TAKE 1 CAPSULE BY MOUTH EVERY MORNING FOR HOT FLASHES 90 capsule 1   No current facility-administered medications on file prior to visit.    BP 130/80   Pulse 86   Temp (!) 97.4 F (36.3 C) (Temporal)   Ht 5\' 9"  (1.753 m)   Wt 153 lb 12 oz (69.7 kg)   SpO2 98%   BMI 22.70 kg/m    Objective:   Physical Exam  Constitutional: She appears well-nourished.  Cardiovascular: Normal rate and regular rhythm.  Respiratory: Effort normal and breath sounds normal.  Musculoskeletal:     Cervical back: Neck supple.  Skin: Skin is warm and dry.  Psychiatric: She has a normal mood and affect.           Assessment & Plan:

## 2019-08-07 NOTE — Assessment & Plan Note (Signed)
Compliant to statin therapy, repeat lipids pending. °

## 2019-08-07 NOTE — Assessment & Plan Note (Signed)
Stable in the office today, continue current regimen. CMP pending.

## 2019-08-07 NOTE — Assessment & Plan Note (Signed)
Doing well on venlafaxine, continue same.  

## 2019-08-07 NOTE — Patient Instructions (Addendum)
Start bupropion SR 100 mg tablets for smoking. Take 1 tablet by mouth once daily for 5 days, then increase to 1 tablet twice daily thereafter.  Stop by the lab prior to leaving today. I will notify you of your results once received.   We will see you in March 2021.  It was a pleasure to see you today!

## 2019-08-07 NOTE — Assessment & Plan Note (Signed)
Ready to quit, would like to re-try Wellbutrin. Offered Chantix but she prefers Wellbutrin.  Rx sent to pharmacy. Follow up in March 2021.

## 2019-08-07 NOTE — Assessment & Plan Note (Signed)
Repeat A1C pending.  Discussed the importance of a healthy diet and regular exercise in order for weight loss, and to reduce the risk of any potential medical problems.  

## 2019-08-08 LAB — COMPREHENSIVE METABOLIC PANEL
AG Ratio: 1.5 (calc) (ref 1.0–2.5)
ALT: 8 U/L (ref 6–29)
AST: 13 U/L (ref 10–35)
Albumin: 4.3 g/dL (ref 3.6–5.1)
Alkaline phosphatase (APISO): 82 U/L (ref 37–153)
BUN: 11 mg/dL (ref 7–25)
CO2: 24 mmol/L (ref 20–32)
Calcium: 10 mg/dL (ref 8.6–10.4)
Chloride: 106 mmol/L (ref 98–110)
Creat: 1 mg/dL (ref 0.50–1.05)
Globulin: 2.8 g/dL (calc) (ref 1.9–3.7)
Glucose, Bld: 109 mg/dL — ABNORMAL HIGH (ref 65–99)
Potassium: 3.6 mmol/L (ref 3.5–5.3)
Sodium: 139 mmol/L (ref 135–146)
Total Bilirubin: 0.4 mg/dL (ref 0.2–1.2)
Total Protein: 7.1 g/dL (ref 6.1–8.1)

## 2019-08-08 LAB — HEMOGLOBIN A1C
Hgb A1c MFr Bld: 5.1 % of total Hgb (ref <5.7)
Mean Plasma Glucose: 100 (calc)
eAG (mmol/L): 5.5 (calc)

## 2019-08-08 LAB — LIPID PANEL
Cholesterol: 246 mg/dL — ABNORMAL HIGH (ref <200)
HDL: 48 mg/dL — ABNORMAL LOW (ref >=50)
LDL Cholesterol (Calc): 179 mg/dL (calc) — ABNORMAL HIGH
Non-HDL Cholesterol (Calc): 198 mg/dL (calc) — ABNORMAL HIGH (ref <130)
Total CHOL/HDL Ratio: 5.1 (calc) — ABNORMAL HIGH (ref <5.0)
Triglycerides: 84 mg/dL (ref <150)

## 2019-08-08 LAB — CBC
HCT: 40.4 % (ref 35.0–45.0)
Hemoglobin: 13.7 g/dL (ref 11.7–15.5)
MCH: 32.6 pg (ref 27.0–33.0)
MCHC: 33.9 g/dL (ref 32.0–36.0)
MCV: 96.2 fL (ref 80.0–100.0)
MPV: 10.6 fL (ref 7.5–12.5)
Platelets: 260 10*3/uL (ref 140–400)
RBC: 4.2 10*6/uL (ref 3.80–5.10)
RDW: 13 % (ref 11.0–15.0)
WBC: 5.3 10*3/uL (ref 3.8–10.8)

## 2019-08-10 DIAGNOSIS — N951 Menopausal and female climacteric states: Secondary | ICD-10-CM

## 2019-08-10 MED ORDER — VENLAFAXINE HCL ER 37.5 MG PO CP24: ORAL_CAPSULE | ORAL | 5 refills | Status: DC

## 2019-08-24 ENCOUNTER — Other Ambulatory Visit: Payer: Self-pay | Admitting: Primary Care

## 2019-08-24 DIAGNOSIS — Z1239 Encounter for other screening for malignant neoplasm of breast: Secondary | ICD-10-CM

## 2019-09-23 ENCOUNTER — Other Ambulatory Visit: Payer: BC Managed Care – PPO

## 2019-09-29 ENCOUNTER — Other Ambulatory Visit: Payer: Self-pay

## 2019-09-29 ENCOUNTER — Ambulatory Visit
Admission: RE | Admit: 2019-09-29 | Discharge: 2019-09-29 | Disposition: A | Discharge status: Home / Self Care | Payer: BC Managed Care – PPO | Source: Ambulatory Visit | Attending: Primary Care | Admitting: Primary Care

## 2019-09-29 DIAGNOSIS — Z1239 Encounter for other screening for malignant neoplasm of breast: Secondary | ICD-10-CM

## 2019-09-30 ENCOUNTER — Encounter: Payer: Self-pay | Admitting: Primary Care

## 2019-09-30 ENCOUNTER — Encounter: Payer: Self-pay | Admitting: Gastroenterology

## 2019-09-30 ENCOUNTER — Other Ambulatory Visit: Payer: Self-pay

## 2019-09-30 ENCOUNTER — Other Ambulatory Visit (HOSPITAL_COMMUNITY)
Admission: RE | Admit: 2019-09-30 | Discharge: 2019-09-30 | Disposition: A | Discharge status: Home / Self Care | Payer: BC Managed Care – PPO | Source: Ambulatory Visit | Attending: Primary Care | Admitting: Primary Care

## 2019-09-30 ENCOUNTER — Ambulatory Visit (INDEPENDENT_AMBULATORY_CARE_PROVIDER_SITE_OTHER): Payer: BC Managed Care – PPO | Admitting: Primary Care

## 2019-09-30 VITALS — BP 136/86 | HR 95 | Temp 97.0°F | Ht 69.0 in | Wt 156.5 lb

## 2019-09-30 DIAGNOSIS — Z0000 Encounter for general adult medical examination without abnormal findings: Secondary | ICD-10-CM | POA: Diagnosis not present

## 2019-09-30 DIAGNOSIS — Z1211 Encounter for screening for malignant neoplasm of colon: Secondary | ICD-10-CM | POA: Diagnosis not present

## 2019-09-30 DIAGNOSIS — R87611 Atypical squamous cells cannot exclude high grade squamous intraepithelial lesion on cytologic smear of cervix (ASC-H): Secondary | ICD-10-CM | POA: Diagnosis not present

## 2019-09-30 DIAGNOSIS — Z720 Tobacco use: Secondary | ICD-10-CM

## 2019-09-30 DIAGNOSIS — Z124 Encounter for screening for malignant neoplasm of cervix: Secondary | ICD-10-CM

## 2019-09-30 DIAGNOSIS — B351 Tinea unguium: Secondary | ICD-10-CM | POA: Insufficient documentation

## 2019-09-30 DIAGNOSIS — I10 Essential (primary) hypertension: Secondary | ICD-10-CM

## 2019-09-30 DIAGNOSIS — E785 Hyperlipidemia, unspecified: Secondary | ICD-10-CM

## 2019-09-30 DIAGNOSIS — Z23 Encounter for immunization: Secondary | ICD-10-CM | POA: Diagnosis not present

## 2019-09-30 DIAGNOSIS — R7303 Prediabetes: Secondary | ICD-10-CM

## 2019-09-30 DIAGNOSIS — N951 Menopausal and female climacteric states: Secondary | ICD-10-CM

## 2019-09-30 HISTORY — DX: Tinea unguium: B35.1

## 2019-09-30 LAB — LIPID PANEL
Cholesterol: 157 mg/dL (ref 0–200)
HDL: 48.2 mg/dL (ref >39.00)
LDL Cholesterol: 91 mg/dL (ref 0–99)
NonHDL: 108.62
Total CHOL/HDL Ratio: 3
Triglycerides: 90 mg/dL (ref 0.0–149.0)
VLDL: 18 mg/dL (ref 0.0–40.0)

## 2019-09-30 MED ORDER — LISINOPRIL 40 MG PO TABS: 40.0000 mg | ORAL_TABLET | Freq: Every day | ORAL | 3 refills | Status: DC

## 2019-09-30 MED ORDER — HYDROCHLOROTHIAZIDE 25 MG PO TABS: 25.0000 mg | ORAL_TABLET | Freq: Every day | ORAL | 3 refills | Status: DC

## 2019-09-30 NOTE — Assessment & Plan Note (Signed)
Doing well on venlafaxine, continue same.  

## 2019-09-30 NOTE — Assessment & Plan Note (Signed)
Referral placed to podiatry.

## 2019-09-30 NOTE — Assessment & Plan Note (Signed)
Recent A1C unremarkable. Continue to monitor.

## 2019-09-30 NOTE — Assessment & Plan Note (Signed)
Pneumonia vaccine due, provided today. She will schedule nurse visits for the Shingrix vaccine series. Tetanus UTD.  Pap smear due, completed today. Mammogram UTD. Colonoscopy due, ordered.  Encouraged regular exercise and a healthy diet. Exam today unremarkable. Labs reviewed and pending.

## 2019-09-30 NOTE — Assessment & Plan Note (Signed)
Working on cutting back. Doing better with Wellbutrin.  Encouraged cessation.

## 2019-09-30 NOTE — Progress Notes (Signed)
Subjective:    Patient ID: Maureen Orozco, female    DOB: 1966-05-28, 54 y.o.   MRN: [id number]  HPI  This visit occurred during the SARS-CoV-2 public health emergency.  Safety protocols were in place, including screening questions prior to the visit, additional usage of staff PPE, and extensive cleaning of exam room while observing appropriate contact time as indicated for disinfecting solutions.   [name] is a 54 year old female who presents today for complete physical.  Immunizations: -Tetanus: Completed in 2016 -Influenza: Did not complete this season  -Shingles: Never completed  -Pneumonia: Never completed   Diet: She endorses a fair diet. Exercise: She is not exercising.   Eye exam: No recent exam Dental exam: No recent exam  Pap Smear: Completed in 2020, ASC cannot exclude high grade lesion. Due again today. Mammogram: Completed yesterday, negative Colonoscopy: Never completed.   BP Readings from Last 3 Encounters:  09/30/19 136/86  08/07/19 130/80  07/02/19 140/80     Review of Systems  Constitutional: Negative for unexpected weight change.  HENT: Negative for rhinorrhea.   Respiratory: Negative for cough and shortness of breath.   Cardiovascular: Negative for chest pain.  Gastrointestinal: Negative for constipation and diarrhea.  Genitourinary: Negative for difficulty urinating.  Musculoskeletal: Negative for arthralgias.  Skin: Negative for rash.  Allergic/Immunologic: Negative for environmental allergies.  Neurological: Negative for dizziness, numbness and headaches.  Psychiatric/Behavioral: The patient is not nervous/anxious.        Past Medical History:  Diagnosis Date  . Essential hypertension   . Hyperlipidemia      Social History   Socioeconomic History  . Marital status: Married    Spouse name: Not on file  . Number of children: Not on file  . Years of education: Not on file  . Highest education level: Not on file  Occupational  History  . Not on file  Tobacco Use  . Smoking status: Current Every Day Smoker    Packs/day: 0.50    Types: Cigarettes  . Smokeless tobacco: Never Used  Substance and Sexual Activity  . Alcohol use: No    Alcohol/week: 0.0 standard drinks  . Drug use: Not on file  . Sexual activity: Not on file  Other Topics Concern  . Not on file  Social History Narrative   Married.   Works at [employer] in [place].   Has one child.   Enjoys playing computer games, watching TV.   Social Determinants of Health   Financial Resource Strain:   . Difficulty of Paying Living Expenses:   Food Insecurity:   . Worried About Charity fundraiser in the Last Year:   . Arboriculturist in the Last Year:   Transportation Needs:   . Film/video editor (Medical):   Marland Kitchen Lack of Transportation (Non-Medical):   Physical Activity:   . Days of Exercise per Week:   . Minutes of Exercise per Session:   Stress:   . Feeling of Stress :   Social Connections:   . Frequency of Communication with Friends and Family:   . Frequency of Social Gatherings with Friends and Family:   . Attends Religious Services:   . Active Member of Clubs or Organizations:   . Attends Archivist Meetings:   Marland Kitchen Marital Status:   Intimate Partner Violence:   . Fear of Current or Ex-Partner:   . Emotionally Abused:   Marland Kitchen Physically Abused:   . Sexually Abused:  No past surgical history on file.  Family History  Adopted: Yes    No Known Allergies  Current Outpatient Medications on File Prior to Visit  Medication Sig Dispense Refill  . amLODipine (NORVASC) 10 MG tablet Take 1 tablet (10 mg total) by mouth daily. for high blood pressure 90 tablet 0  . atorvastatin (LIPITOR) 20 MG tablet TAKE 1 TABLET BY MOUTH EVERY DAY FOR HIGH CHOLESTEROL 90 tablet 0  . buPROPion (WELLBUTRIN SR) 100 MG 12 hr tablet Take 1 tablet (100 mg total) by mouth 2 (two) times daily. For tobacco cessation. 180 tablet 0  .  hydrochlorothiazide (HYDRODIURIL) 25 MG tablet Take 1 tablet (25 mg total) by mouth daily. For blood pressure. 90 tablet 3  . lisinopril (PRINIVIL,ZESTRIL) 40 MG tablet Take 1 tablet (40 mg total) by mouth daily. For blood pressure. 90 tablet 3  . naproxen (NAPROSYN) 500 MG tablet Take 1 tablet (500 mg total) by mouth 2 (two) times daily as needed. For pain. Take with food. 30 tablet 0  . venlafaxine XR (EFFEXOR-XR) 37.5 MG 24 hr capsule TAKE 1 CAPSULE BY MOUTH EVERY MORNING FOR HOT FLASHES 30 capsule 5   No current facility-administered medications on file prior to visit.    BP 136/86   Pulse 95   Temp (!) 97 F (36.1 C) (Temporal)   Ht 5\' 9"  (1.753 m)   Wt 156 lb 8 oz (71 kg)   SpO2 98%   BMI 23.11 kg/m    Objective:   Physical Exam  Constitutional: She is oriented to person, place, and time. She appears well-nourished.  HENT:  Right Ear: Tympanic membrane and ear canal normal.  Left Ear: Tympanic membrane and ear canal normal.  Mouth/Throat: Oropharynx is clear and moist.  Eyes: Pupils are equal, round, and reactive to light. EOM are normal.  Cardiovascular: Normal rate and regular rhythm.  Respiratory: Effort normal and breath sounds normal.  GI: Soft. Bowel sounds are normal. There is no abdominal tenderness.  Musculoskeletal:        General: Normal range of motion.     Cervical back: Neck supple.  Neurological: She is alert and oriented to person, place, and time. No cranial nerve deficit.  Reflex Scores:      Patellar reflexes are 2+ on the right side and 2+ on the left side. Skin: Skin is warm and dry.  Elongated, thickened, yellow toenail to left great toe  Psychiatric: She has a normal mood and affect.           Assessment & Plan:

## 2019-09-30 NOTE — Assessment & Plan Note (Signed)
LDL above goal in January 2021, had been out of atorvastatin for one month. Has been compliant since January 2021, repeat lipids pending.

## 2019-09-30 NOTE — Patient Instructions (Signed)
Stop by the lab prior to leaving today. I will notify you of your results once received.   Start exercising. You should be getting 150 minutes of moderate intensity exercise weekly.  It's important to improve your diet by reducing consumption of fast food, fried food, processed snack foods, sugary drinks. Increase consumption of fresh vegetables and fruits, whole grains, water.  Ensure you are drinking 64 ounces of water daily.  Schedule a nurse visit for the Shingles vaccine series. This is two total shots.  It was a pleasure to see you today!   Preventive Care 68-58 Years Old, Female Preventive care refers to visits with your health care provider and lifestyle choices that can promote health and wellness. This includes:  A yearly physical exam. This may also be called an annual well check.  Regular dental visits and eye exams.  Immunizations.  Screening for certain conditions.  Healthy lifestyle choices, such as eating a healthy diet, getting regular exercise, not using drugs or products that contain nicotine and tobacco, and limiting alcohol use. What can I expect for my preventive care visit? Physical exam Your health care provider will check your:  Height and weight. This may be used to calculate body mass index (BMI), which tells if you are at a healthy weight.  Heart rate and blood pressure.  Skin for abnormal spots. Counseling Your health care provider may ask you questions about your:  Alcohol, tobacco, and drug use.  Emotional well-being.  Home and relationship well-being.  Sexual activity.  Eating habits.  Work and work Statistician.  Method of birth control.  Menstrual cycle.  Pregnancy history. What immunizations do I need?  Influenza (flu) vaccine  This is recommended every year. Tetanus, diphtheria, and pertussis (Tdap) vaccine  You may need a Td booster every 10 years. Varicella (chickenpox) vaccine  You may need this if you have not been  vaccinated. Zoster (shingles) vaccine  You may need this after age 62. Measles, mumps, and rubella (MMR) vaccine  You may need at least one dose of MMR if you were born in 1957 or later. You may also need a second dose. Pneumococcal conjugate (PCV13) vaccine  You may need this if you have certain conditions and were not previously vaccinated. Pneumococcal polysaccharide (PPSV23) vaccine  You may need one or two doses if you smoke cigarettes or if you have certain conditions. Meningococcal conjugate (MenACWY) vaccine  You may need this if you have certain conditions. Hepatitis A vaccine  You may need this if you have certain conditions or if you travel or work in places where you may be exposed to hepatitis A. Hepatitis B vaccine  You may need this if you have certain conditions or if you travel or work in places where you may be exposed to hepatitis B. Haemophilus influenzae type b (Hib) vaccine  You may need this if you have certain conditions. Human papillomavirus (HPV) vaccine  If recommended by your health care provider, you may need three doses over 6 months. You may receive vaccines as individual doses or as more than one vaccine together in one shot (combination vaccines). Talk with your health care provider about the risks and benefits of combination vaccines. What tests do I need? Blood tests  Lipid and cholesterol levels. These may be checked every 5 years, or more frequently if you are over 79 years old.  Hepatitis C test.  Hepatitis B test. Screening  Lung cancer screening. You may have this screening every year starting at  age 36 if you have a 30-pack-year history of smoking and currently smoke or have quit within the past 15 years.  Colorectal cancer screening. All adults should have this screening starting at age 88 and continuing until age 55. Your health care provider may recommend screening at age 50 if you are at increased risk. You will have tests every  1-10 years, depending on your results and the type of screening test.  Diabetes screening. This is done by checking your blood sugar (glucose) after you have not eaten for a while (fasting). You may have this done every 1-3 years.  Mammogram. This may be done every 1-2 years. Talk with your health care provider about when you should start having regular mammograms. This may depend on whether you have a family history of breast cancer.  BRCA-related cancer screening. This may be done if you have a family history of breast, ovarian, tubal, or peritoneal cancers.  Pelvic exam and Pap test. This may be done every 3 years starting at age 78. Starting at age 3, this may be done every 5 years if you have a Pap test in combination with an HPV test. Other tests  Sexually transmitted disease (STD) testing.  Bone density scan. This is done to screen for osteoporosis. You may have this scan if you are at high risk for osteoporosis. Follow these instructions at home: Eating and drinking  Eat a diet that includes fresh fruits and vegetables, whole grains, lean protein, and low-fat dairy.  Take vitamin and mineral supplements as recommended by your health care provider.  Do not drink alcohol if: ? Your health care provider tells you not to drink. ? You are pregnant, may be pregnant, or are planning to become pregnant.  If you drink alcohol: ? Limit how much you have to 0-1 drink a day. ? Be aware of how much alcohol is in your drink. In the [place] one drink equals one 12 oz bottle of beer (355 mL), one 5 oz glass of wine (148 mL), or one 1 oz glass of hard liquor (44 mL). Lifestyle  Take daily care of your teeth and gums.  Stay active. Exercise for at least 30 minutes on 5 or more days each week.  Do not use any products that contain nicotine or tobacco, such as cigarettes, e-cigarettes, and chewing tobacco. If you need help quitting, ask your health care provider.  If you are sexually active,  practice safe sex. Use a condom or other form of birth control (contraception) in order to prevent pregnancy and STIs (sexually transmitted infections).  If told by your health care provider, take low-dose aspirin daily starting at age 34. What's next?  Visit your health care provider once a year for a well check visit.  Ask your health care provider how often you should have your eyes and teeth checked.  Stay up to date on all vaccines. This information is not intended to replace advice given to you by your health care provider. Make sure you discuss any questions you have with your health care provider. Document Revised: 03/13/2018 Document Reviewed: 03/13/2018 Elsevier Patient Education  2020 [employer].

## 2019-09-30 NOTE — Addendum Note (Signed)
Addended by: Jacqualin Combes on: 09/30/2019 04:02 PM   Modules accepted: Orders

## 2019-09-30 NOTE — Assessment & Plan Note (Signed)
Repeat pap smear with HPV pending.

## 2019-09-30 NOTE — Assessment & Plan Note (Signed)
Borderline today, endorses home readings are lower. Continue current regimen.

## 2019-10-01 LAB — CYTOLOGY - PAP
Comment: NEGATIVE
Diagnosis: NEGATIVE
High risk HPV: NEGATIVE

## 2019-10-07 ENCOUNTER — Other Ambulatory Visit: Payer: Self-pay

## 2019-10-07 ENCOUNTER — Ambulatory Visit: Payer: BC Managed Care – PPO

## 2019-10-15 ENCOUNTER — Other Ambulatory Visit: Payer: Self-pay

## 2019-10-15 ENCOUNTER — Ambulatory Visit (AMBULATORY_SURGERY_CENTER): Payer: Self-pay | Admitting: *Deleted

## 2019-10-15 VITALS — Temp 97.3°F | Ht 66.0 in | Wt 158.0 lb

## 2019-10-15 DIAGNOSIS — Z1211 Encounter for screening for malignant neoplasm of colon: Secondary | ICD-10-CM

## 2019-10-15 DIAGNOSIS — Z01818 Encounter for other preprocedural examination: Secondary | ICD-10-CM

## 2019-10-15 MED ORDER — NA SULFATE-K SULFATE-MG SULF 17.5-3.13-1.6 GM/177ML PO SOLN: ORAL | 0 refills | Status: DC

## 2019-10-15 NOTE — Progress Notes (Signed)
Patient is here in-person for PV. Patient denies any allergies to eggs or soy. Patient denies any problems with anesthesia/sedation. Patient denies any oxygen use at home. Patient denies taking any diet/weight loss medications or blood thinners. Patient is not being treated for MRSA or C-diff. EMMI education assisgned to the patient for the procedure, this was explained and instructions given to patient. COVID-19 screening test is on 4/13, the pt is aware.  Patient is aware of our care-partner policy and 0000000 safety protocol.   Prep Prescription coupon given to the patient.

## 2019-10-26 ENCOUNTER — Ambulatory Visit: Payer: BC Managed Care – PPO | Admitting: Podiatry

## 2019-10-26 ENCOUNTER — Encounter: Payer: Self-pay | Admitting: Podiatry

## 2019-10-26 ENCOUNTER — Other Ambulatory Visit: Payer: Self-pay

## 2019-10-26 VITALS — Temp 98.0°F | Resp 16

## 2019-10-26 DIAGNOSIS — L600 Ingrowing nail: Secondary | ICD-10-CM | POA: Diagnosis not present

## 2019-10-26 DIAGNOSIS — B351 Tinea unguium: Secondary | ICD-10-CM

## 2019-10-26 DIAGNOSIS — M79675 Pain in left toe(s): Secondary | ICD-10-CM | POA: Diagnosis not present

## 2019-10-26 NOTE — Patient Instructions (Signed)

## 2019-10-27 ENCOUNTER — Other Ambulatory Visit: Payer: Self-pay | Admitting: Gastroenterology

## 2019-10-27 ENCOUNTER — Ambulatory Visit (INDEPENDENT_AMBULATORY_CARE_PROVIDER_SITE_OTHER): Payer: BC Managed Care – PPO

## 2019-10-27 DIAGNOSIS — Z1159 Encounter for screening for other viral diseases: Secondary | ICD-10-CM

## 2019-10-28 ENCOUNTER — Encounter: Payer: Self-pay | Admitting: Gastroenterology

## 2019-10-28 LAB — SARS CORONAVIRUS 2 (TAT 6-24 HRS): SARS Coronavirus 2: NEGATIVE

## 2019-10-29 ENCOUNTER — Encounter: Payer: BC Managed Care – PPO | Admitting: Gastroenterology

## 2019-10-30 ENCOUNTER — Ambulatory Visit (AMBULATORY_SURGERY_CENTER): Payer: BC Managed Care – PPO | Admitting: Gastroenterology

## 2019-10-30 ENCOUNTER — Other Ambulatory Visit: Payer: Self-pay

## 2019-10-30 ENCOUNTER — Encounter: Payer: Self-pay | Admitting: Gastroenterology

## 2019-10-30 VITALS — BP 127/77 | HR 57 | Temp 96.8°F | Resp 20 | Ht 66.0 in | Wt 158.0 lb

## 2019-10-30 DIAGNOSIS — Z1211 Encounter for screening for malignant neoplasm of colon: Secondary | ICD-10-CM | POA: Diagnosis not present

## 2019-10-30 DIAGNOSIS — D124 Benign neoplasm of descending colon: Secondary | ICD-10-CM

## 2019-10-30 HISTORY — PX: COLONOSCOPY: SHX174

## 2019-10-30 MED ORDER — ONDANSETRON HCL 4 MG PO TABS
4.0000 mg | ORAL_TABLET | Freq: Once | ORAL | Status: AC
  Administered 2019-10-30: 4 mg via ORAL

## 2019-10-30 MED ORDER — SODIUM CHLORIDE 0.9 % IV SOLN: 500.0000 mL | Freq: Once | INTRAVENOUS | Status: DC

## 2019-10-30 NOTE — Progress Notes (Signed)
Called to room to assist during endoscopic procedure.  Patient ID and intended procedure confirmed with present staff. Received instructions for my participation in the procedure from the performing physician.  

## 2019-10-30 NOTE — Progress Notes (Addendum)
Pt's states no medical or surgical changes since previsit or office visit.  [place] JB

## 2019-10-30 NOTE — Patient Instructions (Signed)
Handouts Provided:  Polyps and Hemorrhoids  YOU HAD AN ENDOSCOPIC PROCEDURE TODAY AT THE [facility]:   Refer to the procedure report that was given to you for any specific questions about what was found during the examination.  If the procedure report does not answer your questions, please call your gastroenterologist to clarify.  If you requested that your care partner not be given the details of your procedure findings, then the procedure report has been included in a sealed envelope for you to review at your convenience later.  YOU SHOULD EXPECT: Some feelings of bloating in the abdomen. Passage of more gas than usual.  Walking can help get rid of the air that was put into your GI tract during the procedure and reduce the bloating. If you had a lower endoscopy (such as a colonoscopy or flexible sigmoidoscopy) you may notice spotting of blood in your stool or on the toilet paper. If you underwent a bowel prep for your procedure, you may not have a normal bowel movement for a few days.  Please Note:  You might notice some irritation and congestion in your nose or some drainage.  This is from the oxygen used during your procedure.  There is no need for concern and it should clear up in a day or so.  SYMPTOMS TO REPORT IMMEDIATELY:   Following lower endoscopy (colonoscopy or flexible sigmoidoscopy):  Excessive amounts of blood in the stool  Significant tenderness or worsening of abdominal pains  Swelling of the abdomen that is new, acute  Fever of 100F or higher  For urgent or emergent issues, a gastroenterologist can be reached at any hour by calling [phone]. Do not use MyChart messaging for urgent concerns.    DIET:  We do recommend a small meal at first, but then you may proceed to your regular diet.  Drink plenty of fluids but you should avoid alcoholic beverages for 24 hours.  ACTIVITY:  You should plan to take it easy for the rest of today and you should NOT DRIVE or  use heavy machinery until tomorrow (because of the sedation medicines used during the test).    FOLLOW UP: Our staff will call the number listed on your records 48-72 hours following your procedure to check on you and address any questions or concerns that you may have regarding the information given to you following your procedure. If we do not reach you, we will leave a message.  We will attempt to reach you two times.  During this call, we will ask if you have developed any symptoms of COVID 19. If you develop any symptoms (ie: fever, flu-like symptoms, shortness of breath, cough etc.) before then, please call [phone].  If you test positive for Covid 19 in the 2 weeks post procedure, please call and report this information to us.    If any biopsies were taken you will be contacted by phone or by letter within the next 1-3 weeks.  Please call us at [phone] if you have not heard about the biopsies in 3 weeks.    SIGNATURES/CONFIDENTIALITY: You and/or your care partner have signed paperwork which will be entered into your electronic medical record.  These signatures attest to the fact that that the information above on your After Visit Summary has been reviewed and is understood.  Full responsibility of the confidentiality of this discharge information lies with you and/or your care-partner.  

## 2019-10-30 NOTE — Op Note (Signed)
[facility] Patient Name: Maureen Orozco Procedure Date: 10/30/2019 8:02 AM MRN: [id number] Endoscopist: Jackquline Denmark , MD Age: 54 Referring MD:  Date of Birth: 1965/07/30 Gender: Female Account #: [id number] Procedure:                Colonoscopy Indications:              Screening for colorectal malignant neoplasm Medicines:                Monitored Anesthesia Care Procedure:                Pre-Anesthesia Assessment:                           - Prior to the procedure, a History and Physical                            was performed, and patient medications and                            allergies were reviewed. The patient's tolerance of                            previous anesthesia was also reviewed. The risks                            and benefits of the procedure and the sedation                            options and risks were discussed with the patient.                            All questions were answered, and informed consent                            was obtained. Prior Anticoagulants: The patient has                            taken no previous anticoagulant or antiplatelet                            agents. ASA Grade Assessment: II - A patient with                            mild systemic disease. After reviewing the risks                            and benefits, the patient was deemed in                            satisfactory condition to undergo the procedure.                           After obtaining informed consent, the colonoscope  was passed under direct vision. Throughout the                            procedure, the patient's blood pressure, pulse, and                            oxygen saturations were monitored continuously. The                            Colonoscope was introduced through the anus and                            advanced to the 2 cm into the ileum. The                            colonoscopy was performed  without difficulty. The                            patient tolerated the procedure well. The quality                            of the bowel preparation was good. The terminal                            ileum, ileocecal valve, appendiceal orifice, and                            rectum were photographed. Scope In: 8:16:32 AM Scope Out: 8:31:54 AM Scope Withdrawal Time: 0 hours 9 minutes 22 seconds  Total Procedure Duration: 0 hours 15 minutes 22 seconds  Findings:                 A 4 mm polyp was found in the proximal descending                            colon. The polyp was sessile. The polyp was removed                            with a cold biopsy forceps. Resection and retrieval                            were complete.                           Non-bleeding external and internal hemorrhoids were                            found during retroflexion and during perianal exam.                            The hemorrhoids were small.                           The terminal ileum appeared normal.  The exam was otherwise without abnormality on                            direct and retroflexion views. Complications:            No immediate complications. Estimated Blood Loss:     Estimated blood loss: none. Impression:               -Diminutive colonic polyp s/p polypectomy.                           -Small non-bleeding external and internal                            hemorrhoids.                           -Otherwise normal colonoscopy to TI. Recommendation:           - Patient has a contact number available for                            emergencies. The signs and symptoms of potential                            delayed complications were discussed with the                            patient. Return to normal activities tomorrow.                            Written discharge instructions were provided to the                            patient.                            - Resume previous diet.                           - Continue present medications.                           - Await pathology results.                           - Repeat colonoscopy for surveillance based on                            pathology results. (Left message for Edd Arbour on his                            cell phone)                           - Return to GI clinic PRN. Jackquline Denmark, MD 10/30/2019 8:38:27 AM This report has been signed electronically.

## 2019-10-30 NOTE — Progress Notes (Signed)
A and O x3. Report to RN. Tolerated MAC anesthesia well.

## 2019-11-03 ENCOUNTER — Telehealth: Payer: Self-pay | Admitting: *Deleted

## 2019-11-03 NOTE — Telephone Encounter (Signed)
  Follow up Call-  Call back number 10/30/2019  Post procedure Call Back phone  # [phone]  Permission to leave phone message Yes  Some recent data might be hidden     Patient questions:  Do you have a fever, pain , or abdominal swelling? No. Pain Score  0 *  Have you tolerated food without any problems? Yes.    Have you been able to return to your normal activities? Yes.    Do you have any questions about your discharge instructions: Diet   No. Medications  No. Follow up visit  No.  Do you have questions or concerns about your Care? No.  Actions: * If pain score is 4 or above: 1. No action needed, pain <4.Have you developed a fever since your procedure? no  2.   Have you had an respiratory symptoms (SOB or cough) since your procedure? no  3.   Have you tested positive for COVID 19 since your procedure no  4.   Have you had any family members/close contacts diagnosed with the COVID 19 since your procedure?  no   If yes to any of these questions please route to Joylene John, RN and Erenest Rasher, RN

## 2019-11-05 ENCOUNTER — Ambulatory Visit (INDEPENDENT_AMBULATORY_CARE_PROVIDER_SITE_OTHER): Payer: BC Managed Care – PPO | Admitting: Podiatry

## 2019-11-05 ENCOUNTER — Encounter: Payer: Self-pay | Admitting: Podiatry

## 2019-11-05 ENCOUNTER — Other Ambulatory Visit: Payer: Self-pay

## 2019-11-05 VITALS — Temp 98.0°F

## 2019-11-05 DIAGNOSIS — B351 Tinea unguium: Secondary | ICD-10-CM

## 2019-11-05 DIAGNOSIS — L600 Ingrowing nail: Secondary | ICD-10-CM

## 2019-11-06 NOTE — Progress Notes (Signed)
Subjective:   Patient ID: Maureen Orozco, female   DOB: 54 y.o.   MRN: [id number]   HPI 54 year old female presents the office today for concerns of her left big toenail becoming thick and curling in.  She does have the nail removed.  Nail becomes tender with pressure in shoes.  Denies any redness or drainage or any swelling.  She has no other concerns today.   Review of Systems  All other systems reviewed and are negative.  Past Medical History:  Diagnosis Date  . Essential hypertension   . Hyperlipidemia     Past Surgical History:  Procedure Laterality Date  . COLONOSCOPY  10/30/2019  . DENTAL SURGERY       Current Outpatient Medications:  .  amLODipine (NORVASC) 10 MG tablet, Take 1 tablet (10 mg total) by mouth daily. for high blood pressure, Disp: 90 tablet, Rfl: 0 .  atorvastatin (LIPITOR) 20 MG tablet, TAKE 1 TABLET BY MOUTH EVERY DAY FOR HIGH CHOLESTEROL, Disp: 90 tablet, Rfl: 0 .  buPROPion (WELLBUTRIN SR) 100 MG 12 hr tablet, Take 1 tablet (100 mg total) by mouth 2 (two) times daily. For tobacco cessation., Disp: 180 tablet, Rfl: 0 .  hydrochlorothiazide (HYDRODIURIL) 25 MG tablet, Take 1 tablet (25 mg total) by mouth daily. For blood pressure., Disp: 90 tablet, Rfl: 3 .  lisinopril (ZESTRIL) 40 MG tablet, Take 1 tablet (40 mg total) by mouth daily. For blood pressure., Disp: 90 tablet, Rfl: 3 .  venlafaxine XR (EFFEXOR-XR) 37.5 MG 24 hr capsule, TAKE 1 CAPSULE BY MOUTH EVERY MORNING FOR HOT FLASHES, Disp: 30 capsule, Rfl: 5  No Known Allergies       Objective:  Physical Exam  General: AAO x3, NAD  Dermatological: The left hallux toenail is hypertrophic, dystrophic with yellow-brown discoloration and is incurvated on the corners.  There is no edema, erythema, drainage or pus or any signs of infection.  Vascular: Dorsalis Pedis artery and Posterior Tibial artery pedal pulses are 2/4 bilateral with immedate capillary fill time. There is no pain with calf  compression, swelling, warmth, erythema.   Neruologic: Grossly intact via light touch bilateral.   Musculoskeletal: Muscular strength 5/5 in all groups tested bilateral.  Gait: Unassisted, Nonantalgic.      Assessment:   Left hallux onychomycosis, onychodystrophy    Plan:  -Treatment options discussed including all alternatives, risks, and complications -Etiology of symptoms were discussed -At this time, requested total nail removal without chemical matricectomy. Risks and complications were discussed with the patient for which they understand and  verbally consent to the procedure. Under sterile conditions a total of 3 mL of a mixture of 2% lidocaine plain and 0.5% Marcaine plain was infiltrated in a hallux block fashion. Once anesthetized, the skin was prepped in sterile fashion. A tourniquet was then applied. Next the left hallux nail was excised making sure to remove the entire offending nail border. Once the nail was removed, the area was debrided and the underlying skin was intact. The area was irrigated and hemostasis was obtained.  A dry sterile dressing was applied. After application of the dressing the tourniquet was removed and there is found to be an immediate capillary refill time to the digit. The patient tolerated the procedure well any complications. Post procedure instructions were discussed the patient for which he verbally understood. Follow-up in one week for nail check or sooner if any problems are to arise. Discussed signs/symptoms of worsening infection and directed to call the office immediately should  any occur or go directly to the emergency room. In the meantime, encouraged to call the office with any questions, concerns, changes symptoms.  Trula Slade DPM

## 2019-11-11 ENCOUNTER — Encounter: Payer: Self-pay | Admitting: Gastroenterology

## 2019-11-17 DIAGNOSIS — L600 Ingrowing nail: Secondary | ICD-10-CM

## 2019-11-17 HISTORY — DX: Ingrowing nail: L60.0

## 2019-11-17 NOTE — Progress Notes (Signed)
Subjective: Maureen Orozco is a 54 y.o.  female returns to office today for follow up evaluation after having left Hallux total  nail avulsion performed. Patient has been soaking using epsom salts and applying topical antibiotic covered with bandaid daily.Marland Kitchen  Has been doing well.  No significant pain.  No drainage or pus.  Also notes that her right toenails becoming thickened discolored not causing any pain currently.  Patient denies fevers, chills, nausea, vomiting. Denies any calf pain, chest pain, SOB.   Objective:  Vitals: Reviewed  General: Well developed, nourished, in no acute distress, alert and oriented x3   Dermatology: Skin is warm, dry and supple bilateral. LEFT hallux nail bed appears to be clean, dry, with mild granular tissue and surrounding scab. There is no surrounding erythema, edema, drainage/purulence. The remaining nails appear unremarkable at this time.  Right hallux toenails hypertrophic, dystrophic with yellow-brown discoloration.  No pain.  There are no other lesions or other signs of infection present.  Neurovascular status: Intact. No lower extremity swelling; No pain with calf compression bilateral.  Musculoskeletal: No tenderness to palpation of the left hallux nail fold. Muscular strength within normal limits bilateral.   Assesement and Plan: S/p partial nail avulsion, doing well.   -Continue soaking in epsom salts twice a day followed by antibiotic ointment and a band-aid. Can leave uncovered at night. Continue this until completely healed.  -If the area has not healed in 2 weeks, call the office for follow-up appointment, or sooner if any problems arise.  -Debrided the right hallux toenail sent this for culture to evaluate for onychomycosis. -Monitor for any signs/symptoms of infection. Call the office immediately if any occur or go directly to the emergency room. Call with any questions/concerns.  Celesta Gentile, DPM

## 2019-11-26 ENCOUNTER — Telehealth: Payer: Self-pay | Admitting: *Deleted

## 2019-11-26 NOTE — Telephone Encounter (Signed)
Pt returned the call and I informed of Dr. Leigh Aurora review of results and orders.

## 2019-11-26 NOTE — Telephone Encounter (Signed)
Left message for pt to call for results  

## 2019-11-26 NOTE — Telephone Encounter (Signed)
-----   Message from Trula Slade, DPM sent at 11/25/2019  8:05 PM EDT ----- Val- please let her know that the culture did not show fungus but would recommend using urea gel to the nail daily. She can use  on the right big toe as we removed the left hallux toenail.

## 2019-12-16 ENCOUNTER — Other Ambulatory Visit: Payer: Self-pay

## 2019-12-16 DIAGNOSIS — Z720 Tobacco use: Secondary | ICD-10-CM

## 2019-12-16 MED ORDER — BUPROPION HCL ER (SR) 100 MG PO TB12: 100.0000 mg | ORAL_TABLET | Freq: Two times a day (BID) | ORAL | 0 refills | Status: DC

## 2019-12-29 ENCOUNTER — Ambulatory Visit (INDEPENDENT_AMBULATORY_CARE_PROVIDER_SITE_OTHER): Payer: BC Managed Care – PPO | Admitting: *Deleted

## 2019-12-29 DIAGNOSIS — Z23 Encounter for immunization: Secondary | ICD-10-CM

## 2019-12-29 NOTE — Progress Notes (Signed)
Per orders of Allie Bossier, NP, injection of Shingrix #1 given by Virl Cagey. Patient tolerated injection well.

## 2020-03-01 ENCOUNTER — Other Ambulatory Visit: Payer: Self-pay

## 2020-03-01 ENCOUNTER — Ambulatory Visit (INDEPENDENT_AMBULATORY_CARE_PROVIDER_SITE_OTHER): Payer: BC Managed Care – PPO

## 2020-03-01 DIAGNOSIS — Z23 Encounter for immunization: Secondary | ICD-10-CM

## 2020-03-10 ENCOUNTER — Other Ambulatory Visit: Payer: Self-pay

## 2020-03-10 ENCOUNTER — Telehealth (INDEPENDENT_AMBULATORY_CARE_PROVIDER_SITE_OTHER): Payer: BC Managed Care – PPO | Admitting: Primary Care

## 2020-03-10 DIAGNOSIS — K0889 Other specified disorders of teeth and supporting structures: Secondary | ICD-10-CM | POA: Diagnosis not present

## 2020-03-10 DIAGNOSIS — B9789 Other viral agents as the cause of diseases classified elsewhere: Secondary | ICD-10-CM | POA: Diagnosis not present

## 2020-03-10 DIAGNOSIS — J019 Acute sinusitis, unspecified: Secondary | ICD-10-CM

## 2020-03-10 HISTORY — DX: Other viral agents as the cause of diseases classified elsewhere: B97.89

## 2020-03-10 MED ORDER — FLUTICASONE PROPIONATE 50 MCG/ACT NA SUSP: 1.0000 | Freq: Two times a day (BID) | NASAL | 0 refills | Status: DC

## 2020-03-10 MED ORDER — AMOXICILLIN 875 MG PO TABS: 875.0000 mg | ORAL_TABLET | Freq: Two times a day (BID) | ORAL | 0 refills | Status: DC

## 2020-03-10 NOTE — Patient Instructions (Signed)
Nasal Congestion/Ear Pressure/Sinus Pressure: Try using Flonase (fluticasone) nasal spray. Instill 1 spray in each nostril twice daily.   Start amoxicillin antibiotics for the infection. Take 1 tablet by mouth twice daily for 7 days.  Follow up with the dentist as scheduled.  Nice to see you! Allie Bossier, NP-C

## 2020-03-10 NOTE — Progress Notes (Signed)
Subjective:    Patient ID: Maureen Orozco, female    DOB: Dec 24, 1965, 54 y.o.   MRN: [id number]  HPI  Virtual Visit via Video Note  I connected with Maureen Orozco on 03/10/20 at  8:20 AM EDT by a video enabled telemedicine application and verified that I am speaking with the correct person using two identifiers.  Location: Patient: Home Provider: Office Participants: Patient and myself   I discussed the limitations of evaluation and management by telemedicine and the availability of in person appointments. The patient expressed understanding and agreed to proceed.  History of Present Illness:  Maureen Orozco is a 54 year old female with a history of hypertension, tobacco abuse, hyperlipidemia, prediabetes, dental carries who presents today with a chief complaint of sinus pressure.  She also reports dental pain and nasal congestion. She denies cough, loss of taste/smell, diarrhea. She had a low grade fever of 100 a few days ago. She's taken Advil and Tylenol without improvement.   Her dental pain is located to the left lower region of her mouth, feels like her prior infections. She has an appointment with a dentist but not until next week. She's not able to eat much due to pain.   Observations/Objective:  Alert and oriented. Appears well, not sickly. No distress. Speaking in complete sentences. No cough.  Assessment and Plan:  Acute sinus symptoms appear to be viral vs allergy at this point. Also with what sounds like a recurrent dental abscess. She does have an appointment with a dentist for next week.  Rx for Flonase sent to pharmacy to use for sinus congestion. Rx for Amoxil course sent to pharmacy for dental pain.  Follow up PRN.  Follow Up Instructions:  Nasal Congestion/Ear Pressure/Sinus Pressure: Try using Flonase (fluticasone) nasal spray. Instill 1 spray in each nostril twice daily.   Start amoxicillin antibiotics for the infection. Take 1 tablet by mouth  twice daily for 7 days.  Follow up with the dentist as scheduled.  Nice to see you! Allie Bossier, NP-C   I discussed the assessment and treatment plan with the patient. The patient was provided an opportunity to ask questions and all were answered. The patient agreed with the plan and demonstrated an understanding of the instructions.   The patient was advised to call back or seek an in-person evaluation if the symptoms worsen or if the condition fails to improve as anticipated.    Pleas Koch, NP    Review of Systems  Constitutional: Positive for fever. Negative for chills.  HENT: Positive for congestion and sinus pressure.        Dental pain  Respiratory: Negative for cough.   Allergic/Immunologic: Positive for environmental allergies.       Past Medical History:  Diagnosis Date   Essential hypertension    Hyperlipidemia      Social History   Socioeconomic History   Marital status: Married    Spouse name: Not on file   Number of children: Not on file   Years of education: Not on file   Highest education level: Not on file  Occupational History   Not on file  Tobacco Use   Smoking status: Current Every Day Smoker    Packs/day: 0.25    Types: Cigarettes   Smokeless tobacco: Never Used  Vaping Use   Vaping Use: Some days  Substance and Sexual Activity   Alcohol use: No    Alcohol/week: 0.0 standard drinks   Drug use: Not  Currently   Sexual activity: Not on file  Other Topics Concern   Not on file  Social History Narrative   Married.   Works at [employer] in [place].   Has one child.   Enjoys playing computer games, watching TV.   Social Determinants of Health   Financial Resource Strain:    Difficulty of Paying Living Expenses: Not on file  Food Insecurity:    Worried About Charity fundraiser in the Last Year: Not on file   [employer] of Food in the Last Year: Not on file  Transportation Needs:    Lack of Transportation  (Medical): Not on file   Lack of Transportation (Non-Medical): Not on file  Physical Activity:    Days of Exercise per Week: Not on file   Minutes of Exercise per Session: Not on file  Stress:    Feeling of Stress : Not on file  Social Connections:    Frequency of Communication with Friends and Family: Not on file   Frequency of Social Gatherings with Friends and Family: Not on file   Attends Religious Services: Not on file   Active Member of Clubs or Organizations: Not on file   Attends Archivist Meetings: Not on file   Marital Status: Not on file  Intimate Partner Violence:    Fear of Current or Ex-Partner: Not on file   Emotionally Abused: Not on file   Physically Abused: Not on file   Sexually Abused: Not on file    Past Surgical History:  Procedure Laterality Date   COLONOSCOPY  10/30/2019   DENTAL SURGERY      Family History  Adopted: Yes    No Known Allergies  Current Outpatient Medications on File Prior to Visit  Medication Sig Dispense Refill   amLODipine (NORVASC) 10 MG tablet Take 1 tablet (10 mg total) by mouth daily. for high blood pressure 90 tablet 0   atorvastatin (LIPITOR) 20 MG tablet TAKE 1 TABLET BY MOUTH EVERY DAY FOR HIGH CHOLESTEROL 90 tablet 0   buPROPion (WELLBUTRIN SR) 100 MG 12 hr tablet Take 1 tablet (100 mg total) by mouth 2 (two) times daily. For tobacco cessation. 180 tablet 0   hydrochlorothiazide (HYDRODIURIL) 25 MG tablet Take 1 tablet (25 mg total) by mouth daily. For blood pressure. 90 tablet 3   lisinopril (ZESTRIL) 40 MG tablet Take 1 tablet (40 mg total) by mouth daily. For blood pressure. 90 tablet 3   venlafaxine XR (EFFEXOR-XR) 37.5 MG 24 hr capsule TAKE 1 CAPSULE BY MOUTH EVERY MORNING FOR HOT FLASHES 30 capsule 5   No current facility-administered medications on file prior to visit.    There were no vitals taken for this visit.   Objective:   Physical Exam Constitutional:      Appearance:  She is not ill-appearing.     Comments: Appears tired  Pulmonary:     Effort: Pulmonary effort is normal.     Comments: No cough Neurological:     Mental Status: She is alert.  Psychiatric:        Mood and Affect: Mood normal.            Assessment & Plan:

## 2020-03-10 NOTE — Assessment & Plan Note (Signed)
Acute sinus symptoms appear to be viral vs allergy at this point. Also with what sounds like a recurrent dental abscess. She does have an appointment with a dentist for next week.  Rx for Flonase sent to pharmacy to use for sinus congestion. Rx for Amoxil course sent to pharmacy for dental pain.  Follow up PRN.

## 2020-03-23 ENCOUNTER — Other Ambulatory Visit: Payer: Self-pay | Admitting: Primary Care

## 2020-03-23 DIAGNOSIS — I10 Essential (primary) hypertension: Secondary | ICD-10-CM

## 2020-03-25 ENCOUNTER — Other Ambulatory Visit: Payer: Self-pay

## 2020-03-25 DIAGNOSIS — Z720 Tobacco use: Secondary | ICD-10-CM

## 2020-03-25 MED ORDER — BUPROPION HCL ER (SR) 100 MG PO TB12: 100.0000 mg | ORAL_TABLET | Freq: Two times a day (BID) | ORAL | 0 refills | Status: DC

## 2020-04-22 ENCOUNTER — Other Ambulatory Visit: Payer: Self-pay

## 2020-04-22 DIAGNOSIS — I10 Essential (primary) hypertension: Secondary | ICD-10-CM

## 2020-04-22 MED ORDER — AMLODIPINE BESYLATE 10 MG PO TABS: 10.0000 mg | ORAL_TABLET | Freq: Every day | ORAL | 0 refills | Status: DC

## 2020-05-24 ENCOUNTER — Other Ambulatory Visit: Payer: Self-pay | Admitting: Primary Care

## 2020-05-24 DIAGNOSIS — E785 Hyperlipidemia, unspecified: Secondary | ICD-10-CM

## 2020-05-27 ENCOUNTER — Other Ambulatory Visit: Payer: Self-pay | Admitting: Primary Care

## 2020-05-27 DIAGNOSIS — N951 Menopausal and female climacteric states: Secondary | ICD-10-CM

## 2020-05-27 NOTE — Telephone Encounter (Signed)
Pharmacy requests refill on: Venlafaxine HCL ER 37.5 mg   LAST REFILL: 08/10/2019 LAST OV: 09/30/2019 NEXT OV: Not Scheduled PHARMACY: [facility]

## 2020-05-27 NOTE — Telephone Encounter (Signed)
Refill sent to pharmacy.   

## 2020-07-15 ENCOUNTER — Other Ambulatory Visit: Payer: Self-pay

## 2020-07-15 DIAGNOSIS — Z720 Tobacco use: Secondary | ICD-10-CM

## 2020-07-19 ENCOUNTER — Other Ambulatory Visit: Payer: Self-pay

## 2020-07-19 DIAGNOSIS — Z720 Tobacco use: Secondary | ICD-10-CM

## 2020-07-19 MED ORDER — BUPROPION HCL ER (SR) 100 MG PO TB12: 100.0000 mg | ORAL_TABLET | Freq: Two times a day (BID) | ORAL | 0 refills | Status: DC

## 2020-09-28 ENCOUNTER — Other Ambulatory Visit: Payer: Self-pay

## 2020-09-28 DIAGNOSIS — I10 Essential (primary) hypertension: Secondary | ICD-10-CM

## 2020-09-28 MED ORDER — AMLODIPINE BESYLATE 10 MG PO TABS: 10.0000 mg | ORAL_TABLET | Freq: Every day | ORAL | 0 refills | Status: DC

## 2020-10-04 ENCOUNTER — Other Ambulatory Visit: Payer: Self-pay | Admitting: Internal Medicine

## 2020-10-04 DIAGNOSIS — Z1231 Encounter for screening mammogram for malignant neoplasm of breast: Secondary | ICD-10-CM

## 2020-10-19 ENCOUNTER — Ambulatory Visit: Payer: BC Managed Care – PPO | Admitting: Internal Medicine

## 2020-10-20 ENCOUNTER — Ambulatory Visit: Payer: BC Managed Care – PPO | Admitting: Internal Medicine

## 2020-10-20 ENCOUNTER — Other Ambulatory Visit: Payer: Self-pay

## 2020-10-20 ENCOUNTER — Encounter: Payer: Self-pay | Admitting: Internal Medicine

## 2020-10-20 VITALS — BP 130/82 | HR 88 | Temp 98.1°F | Wt 162.0 lb

## 2020-10-20 DIAGNOSIS — R300 Dysuria: Secondary | ICD-10-CM

## 2020-10-20 DIAGNOSIS — R1031 Right lower quadrant pain: Secondary | ICD-10-CM | POA: Diagnosis not present

## 2020-10-20 LAB — POC URINALSYSI DIPSTICK (AUTOMATED)
Bilirubin, UA: NEGATIVE
Glucose, UA: NEGATIVE
Nitrite, UA: NEGATIVE
Protein, UA: POSITIVE — AB
Spec Grav, UA: 1.025 (ref 1.010–1.025)
Urobilinogen, UA: 0.2 E.U./dL
pH, UA: 5.5 (ref 5.0–8.0)

## 2020-10-20 MED ORDER — NITROFURANTOIN MONOHYD MACRO 100 MG PO CAPS: 100.0000 mg | ORAL_CAPSULE | Freq: Two times a day (BID) | ORAL | 0 refills | Status: DC

## 2020-10-20 NOTE — Patient Instructions (Signed)

## 2020-10-20 NOTE — Progress Notes (Signed)
HPI  Pt presents to the clinic today with c/o dysuria and right lower quadrant abdominal pain.  She reports this has been intermittent over the last week.  She denies urgency, frequency, blood in her urine or vaginal symptoms.  She is postmenopausal.  She has no history of kidney stones.  She has not taken anything OTC for this.   Review of Systems  Past Medical History:  Diagnosis Date  . Essential hypertension   . Hyperlipidemia     Family History  Adopted: Yes    Social History   Socioeconomic History  . Marital status: Married    Spouse name: Not on file  . Number of children: Not on file  . Years of education: Not on file  . Highest education level: Not on file  Occupational History  . Not on file  Tobacco Use  . Smoking status: Current Every Day Smoker    Packs/day: 0.25    Types: Cigarettes  . Smokeless tobacco: Never Used  Vaping Use  . Vaping Use: Some days  Substance and Sexual Activity  . Alcohol use: No    Alcohol/week: 0.0 standard drinks  . Drug use: Not Currently  . Sexual activity: Not on file  Other Topics Concern  . Not on file  Social History Narrative   Married.   Works at [employer] in [place].   Has one child.   Enjoys playing computer games, watching TV.   Social Determinants of Health   Financial Resource Strain: Not on file  Food Insecurity: Not on file  Transportation Needs: Not on file  Physical Activity: Not on file  Stress: Not on file  Social Connections: Not on file  Intimate Partner Violence: Not on file    No Known Allergies   Constitutional: Denies fever, malaise, fatigue, headache or abrupt weight changes.   GU: Pt reports abdominal pain and pain with urination. Denies urgency, frequency, burning sensation, blood in urine, odor or discharge. Skin: Denies redness, rashes, lesions or ulcercations.   No other specific complaints in a complete review of systems (except as listed in HPI above).    Objective:    Physical Exam  BP 130/82   Pulse 88   Temp 98.1 F (36.7 C) (Temporal)   Wt 162 lb (73.5 kg)   SpO2 96%   BMI 26.15 kg/m  Wt Readings from Last 3 Encounters:  10/20/20 162 lb (73.5 kg)  10/30/19 158 lb (71.7 kg)  10/15/19 158 lb (71.7 kg)    General: Appears her stated age, well developed, well nourished in NAD. Cardiovascular: Normal rate and rhythm. S1,S2 noted.   Pulmonary/Chest: Normal effort and positive vesicular breath sounds. No respiratory distress. No wheezes, rales or ronchi noted.  Abdomen: Soft. Normal bowel sounds. No distention or masses noted.  Tender to palpation in the RLQ. No CVA tenderness.        Assessment & Plan:   Dysuria, RLQ Abdominal Pain:  Urinalysis: 1+ leuks, trace blood Will send urine culture eRx sent if for Macrobid 100 mg BID x 5 days OK to take AZO OTC Drink plenty of fluids  RTC as needed or if symptoms persist. Webb Silversmith, NP

## 2020-10-21 LAB — URINE CULTURE
MICRO NUMBER:: 11743464
SPECIMEN QUALITY:: ADEQUATE

## 2020-10-25 NOTE — Telephone Encounter (Signed)
I think you may have been working on this Trulicity coverage, right?

## 2020-11-11 NOTE — Telephone Encounter (Signed)
See phone note for documentation on this.

## 2020-11-18 ENCOUNTER — Other Ambulatory Visit: Payer: Self-pay

## 2020-11-18 ENCOUNTER — Ambulatory Visit
Admission: RE | Admit: 2020-11-18 | Discharge: 2020-11-18 | Disposition: A | Discharge status: Home / Self Care | Payer: BC Managed Care – PPO | Source: Ambulatory Visit | Attending: Internal Medicine | Admitting: Internal Medicine

## 2020-11-18 DIAGNOSIS — I10 Essential (primary) hypertension: Secondary | ICD-10-CM

## 2020-11-18 DIAGNOSIS — Z720 Tobacco use: Secondary | ICD-10-CM

## 2020-11-18 DIAGNOSIS — Z1231 Encounter for screening mammogram for malignant neoplasm of breast: Secondary | ICD-10-CM

## 2020-11-18 NOTE — Telephone Encounter (Signed)
Refill request Wellbutrin Last refill 07/19/20 #180 Upcoming appointment 12/02/20 Last office visit video with PCP 03/10/20

## 2020-11-21 ENCOUNTER — Other Ambulatory Visit: Payer: Self-pay

## 2020-11-21 DIAGNOSIS — N951 Menopausal and female climacteric states: Secondary | ICD-10-CM

## 2020-11-21 DIAGNOSIS — I10 Essential (primary) hypertension: Secondary | ICD-10-CM

## 2020-11-21 MED ORDER — VENLAFAXINE HCL ER 37.5 MG PO CP24: ORAL_CAPSULE | ORAL | 0 refills | Status: DC

## 2020-11-21 MED ORDER — LISINOPRIL 40 MG PO TABS: 40.0000 mg | ORAL_TABLET | Freq: Every day | ORAL | 0 refills | Status: DC

## 2020-11-21 MED ORDER — BUPROPION HCL ER (SR) 100 MG PO TB12: 100.0000 mg | ORAL_TABLET | Freq: Two times a day (BID) | ORAL | 0 refills | Status: DC

## 2020-12-02 ENCOUNTER — Other Ambulatory Visit (HOSPITAL_COMMUNITY)
Admission: RE | Admit: 2020-12-02 | Discharge: 2020-12-02 | Disposition: A | Discharge status: Home / Self Care | Payer: BC Managed Care – PPO | Source: Ambulatory Visit | Attending: Primary Care | Admitting: Primary Care

## 2020-12-02 ENCOUNTER — Other Ambulatory Visit: Payer: Self-pay

## 2020-12-02 ENCOUNTER — Ambulatory Visit: Payer: BC Managed Care – PPO | Admitting: Primary Care

## 2020-12-02 ENCOUNTER — Encounter: Payer: Self-pay | Admitting: Primary Care

## 2020-12-02 VITALS — BP 178/88 | HR 100 | Temp 97.6°F | Ht 66.0 in | Wt 164.0 lb

## 2020-12-02 DIAGNOSIS — N951 Menopausal and female climacteric states: Secondary | ICD-10-CM | POA: Diagnosis not present

## 2020-12-02 DIAGNOSIS — Z124 Encounter for screening for malignant neoplasm of cervix: Secondary | ICD-10-CM | POA: Insufficient documentation

## 2020-12-02 DIAGNOSIS — Z0000 Encounter for general adult medical examination without abnormal findings: Secondary | ICD-10-CM

## 2020-12-02 DIAGNOSIS — Z114 Encounter for screening for human immunodeficiency virus [HIV]: Secondary | ICD-10-CM

## 2020-12-02 DIAGNOSIS — R87611 Atypical squamous cells cannot exclude high grade squamous intraepithelial lesion on cytologic smear of cervix (ASC-H): Secondary | ICD-10-CM

## 2020-12-02 DIAGNOSIS — E785 Hyperlipidemia, unspecified: Secondary | ICD-10-CM | POA: Diagnosis not present

## 2020-12-02 DIAGNOSIS — R7303 Prediabetes: Secondary | ICD-10-CM

## 2020-12-02 DIAGNOSIS — Z1159 Encounter for screening for other viral diseases: Secondary | ICD-10-CM

## 2020-12-02 DIAGNOSIS — Z720 Tobacco use: Secondary | ICD-10-CM

## 2020-12-02 DIAGNOSIS — R3129 Other microscopic hematuria: Secondary | ICD-10-CM

## 2020-12-02 DIAGNOSIS — K219 Gastro-esophageal reflux disease without esophagitis: Secondary | ICD-10-CM

## 2020-12-02 MED ORDER — OMEPRAZOLE 40 MG PO CPDR: 40.0000 mg | DELAYED_RELEASE_CAPSULE | Freq: Every day | ORAL | 0 refills | Status: DC

## 2020-12-02 NOTE — Addendum Note (Signed)
Addended by: Ellamae Sia on: 12/02/2020 04:16 PM   Modules accepted: Orders

## 2020-12-02 NOTE — Assessment & Plan Note (Signed)
Doing well on venlafaxine ER 37.5 mg, continue same.

## 2020-12-02 NOTE — Assessment & Plan Note (Signed)
Compliant to atorvastatin 20 mg, repeat lipid panel pending.

## 2020-12-02 NOTE — Assessment & Plan Note (Signed)
Discussed the importance of a healthy diet and regular exercise in order for weight loss, and to reduce the risk of any potential medical problems.  Repeat A1C pending. 

## 2020-12-02 NOTE — Progress Notes (Signed)
Subjective:    Patient ID: Maureen Orozco, female    DOB: 09-30-1965, 55 y.o.   MRN: [id number]  HPI  Maureen Orozco is a very pleasant 55 y.o. female who presents today for complete physical.  She would also like to discuss acid reflux. Symptoms include burning in the chest. She's tried Prilosec and Nexium daily without improvement. She will take Tums with some improvement. She continues to smoke and drinks soda daily.   Immunizations: -Tetanus: 2016 -Influenza: No recent vaccine -Covid-19: Has not completed -Shingles: Completed 2 vaccines -Pneumonia:  2021  Diet: She endorses a healthy diet.  Exercise: She is not exercising   Eye exam: No recent exam  Dental exam: Completes   Pap Smear: 2021, due again this year Mammogram: May 2022 Colonoscopy: 2021, due 2028  BP Readings from Last 3 Encounters:  12/02/20 (!) 190/88  10/20/20 130/82  10/30/19 127/77       Review of Systems  Constitutional: Negative for unexpected weight change.  HENT: Negative for rhinorrhea.   Eyes: Negative for visual disturbance.  Respiratory: Negative for cough and shortness of breath.   Cardiovascular: Negative for chest pain.  Gastrointestinal: Negative for constipation and diarrhea.       GERD  Genitourinary: Negative for difficulty urinating.  Musculoskeletal: Positive for arthralgias and back pain.  Skin: Negative for rash.  Allergic/Immunologic: Negative for environmental allergies.  Neurological: Negative for dizziness and headaches.  Psychiatric/Behavioral: The patient is not nervous/anxious.          Past Medical History:  Diagnosis Date  . Essential hypertension   . Hyperlipidemia     Social History   Socioeconomic History  . Marital status: Married    Spouse name: Not on file  . Number of children: Not on file  . Years of education: Not on file  . Highest education level: Not on file  Occupational History  . Not on file  Tobacco Use  . Smoking status:  Current Every Day Smoker    Packs/day: 0.25    Types: Cigarettes  . Smokeless tobacco: Never Used  Vaping Use  . Vaping Use: Some days  Substance and Sexual Activity  . Alcohol use: No    Alcohol/week: 0.0 standard drinks  . Drug use: Not Currently  . Sexual activity: Not on file  Other Topics Concern  . Not on file  Social History Narrative   Married.   Works at [employer] in [place].   Has one child.   Enjoys playing computer games, watching TV.   Social Determinants of Health   Financial Resource Strain: Not on file  Food Insecurity: Not on file  Transportation Needs: Not on file  Physical Activity: Not on file  Stress: Not on file  Social Connections: Not on file  Intimate Partner Violence: Not on file    Past Surgical History:  Procedure Laterality Date  . BREAST BIOPSY Left 03/09/2016    hyalinized fibroadenoma   . COLONOSCOPY  10/30/2019  . DENTAL SURGERY      Family History  Adopted: Yes    No Known Allergies  Current Outpatient Medications on File Prior to Visit  Medication Sig Dispense Refill  . amLODipine (NORVASC) 10 MG tablet Take 1 tablet (10 mg total) by mouth daily. for high blood pressure 90 tablet 0  . atorvastatin (LIPITOR) 20 MG tablet TAKE 1 TABLET BY MOUTH EVERY DAY FOR HIGH CHOLESTEROL 90 tablet 1  . buPROPion (WELLBUTRIN SR) 100 MG 12 hr tablet Take  1 tablet (100 mg total) by mouth 2 (two) times daily. For tobacco cessation. 60 tablet 0  . hydrochlorothiazide (HYDRODIURIL) 25 MG tablet Take 1 tablet (25 mg total) by mouth daily. For blood pressure. 90 tablet 3  . lisinopril (ZESTRIL) 40 MG tablet Take 1 tablet (40 mg total) by mouth daily. For blood pressure. 30 tablet 0  . venlafaxine XR (EFFEXOR-XR) 37.5 MG 24 hr capsule Take one Capsule by mouth every morning for Hot flashes Needs office visit for more refills 90 capsule 0   No current facility-administered medications on file prior to visit.    BP (!) 190/88   Pulse 100   Temp  97.6 F (36.4 C) (Temporal)   Ht 5\' 6"  (1.676 m)   Wt 164 lb (74.4 kg)   SpO2 95%   BMI 26.47 kg/m  Objective:   Physical Exam HENT:     Right Ear: Tympanic membrane and ear canal normal.     Left Ear: Tympanic membrane and ear canal normal.     Nose: Nose normal.  Eyes:     Conjunctiva/sclera: Conjunctivae normal.     Pupils: Pupils are equal, round, and reactive to light.  Neck:     Thyroid: No thyromegaly.  Cardiovascular:     Rate and Rhythm: Normal rate and regular rhythm.     Heart sounds: No murmur heard.   Pulmonary:     Effort: Pulmonary effort is normal.     Breath sounds: Normal breath sounds. No rales.  Abdominal:     General: Bowel sounds are normal.     Palpations: Abdomen is soft.     Tenderness: There is no abdominal tenderness.  Genitourinary:    Labia:        Right: No tenderness or lesion.        Left: No tenderness or lesion.      Cervix: No cervical motion tenderness or discharge.     Uterus: Absent.      Adnexa: Right adnexa normal and left adnexa normal.  Musculoskeletal:        General: Normal range of motion.     Cervical back: Neck supple.  Lymphadenopathy:     Cervical: No cervical adenopathy.  Skin:    General: Skin is warm and dry.     Findings: No rash.  Neurological:     Mental Status: She is alert and oriented to person, place, and time.     Cranial Nerves: No cranial nerve deficit.     Deep Tendon Reflexes: Reflexes are normal and symmetric.  Psychiatric:        Mood and Affect: Mood normal.           Assessment & Plan:      This visit occurred during the SARS-CoV-2 public health emergency.  Safety protocols were in place, including screening questions prior to the visit, additional usage of staff PPE, and extensive cleaning of exam room while observing appropriate contact time as indicated for disinfecting solutions.

## 2020-12-02 NOTE — Assessment & Plan Note (Signed)
Pap smear from 2021 negative, will repeat again this year. Pap smear pending.

## 2020-12-02 NOTE — Patient Instructions (Signed)
Stop by the lab prior to leaving today. I will notify you of your results once received.   Start omeprazole 40 mg once daily for heartburn.  It was a pleasure to see you today!   Preventive Care 101-52 Years Old, Female Preventive care refers to lifestyle choices and visits with your health care provider that can promote health and wellness. This includes:  A yearly physical exam. This is also called an annual wellness visit.  Regular dental and eye exams.  Immunizations.  Screening for certain conditions.  Healthy lifestyle choices, such as: ? Eating a healthy diet. ? Getting regular exercise. ? Not using drugs or products that contain nicotine and tobacco. ? Limiting alcohol use. What can I expect for my preventive care visit? Physical exam Your health care provider will check your:  Height and weight. These may be used to calculate your BMI (body mass index). BMI is a measurement that tells if you are at a healthy weight.  Heart rate and blood pressure.  Body temperature.  Skin for abnormal spots. Counseling Your health care provider may ask you questions about your:  Past medical problems.  Family's medical history.  Alcohol, tobacco, and drug use.  Emotional well-being.  Home life and relationship well-being.  Sexual activity.  Diet, exercise, and sleep habits.  Work and work Statistician.  Access to firearms.  Method of birth control.  Menstrual cycle.  Pregnancy history. What immunizations do I need? Vaccines are usually given at various ages, according to a schedule. Your health care provider will recommend vaccines for you based on your age, medical history, and lifestyle or other factors, such as travel or where you work.   What tests do I need? Blood tests  Lipid and cholesterol levels. These may be checked every 5 years, or more often if you are over 73 years old.  Hepatitis C test.  Hepatitis B test. Screening  Lung cancer screening.  You may have this screening every year starting at age 84 if you have a 30-pack-year history of smoking and currently smoke or have quit within the past 15 years.  Colorectal cancer screening. ? All adults should have this screening starting at age 65 and continuing until age 13. ? Your health care provider may recommend screening at age 36 if you are at increased risk. ? You will have tests every 1-10 years, depending on your results and the type of screening test.  Diabetes screening. ? This is done by checking your blood sugar (glucose) after you have not eaten for a while (fasting). ? You may have this done every 1-3 years.  Mammogram. ? This may be done every 1-2 years. ? Talk with your health care provider about when you should start having regular mammograms. This may depend on whether you have a family history of breast cancer.  BRCA-related cancer screening. This may be done if you have a family history of breast, ovarian, tubal, or peritoneal cancers.  Pelvic exam and Pap test. ? This may be done every 3 years starting at age 79. ? Starting at age 49, this may be done every 5 years if you have a Pap test in combination with an HPV test. Other tests  STD (sexually transmitted disease) testing, if you are at risk.  Bone density scan. This is done to screen for osteoporosis. You may have this scan if you are at high risk for osteoporosis. Talk with your health care provider about your test results, treatment options, and if  necessary, the need for more tests. Follow these instructions at home: Eating and drinking  Eat a diet that includes fresh fruits and vegetables, whole grains, lean protein, and low-fat dairy products.  Take vitamin and mineral supplements as recommended by your health care provider.  Do not drink alcohol if: ? Your health care provider tells you not to drink. ? You are pregnant, may be pregnant, or are planning to become pregnant.  If you drink  alcohol: ? Limit how much you have to 0-1 drink a day. ? Be aware of how much alcohol is in your drink. In the [place] one drink equals one 12 oz bottle of beer (355 mL), one 5 oz glass of wine (148 mL), or one 1 oz glass of hard liquor (44 mL).   Lifestyle  Take daily care of your teeth and gums. Brush your teeth every morning and night with fluoride toothpaste. Floss one time each day.  Stay active. Exercise for at least 30 minutes 5 or more days each week.  Do not use any products that contain nicotine or tobacco, such as cigarettes, e-cigarettes, and chewing tobacco. If you need help quitting, ask your health care provider.  Do not use drugs.  If you are sexually active, practice safe sex. Use a condom or other form of protection to prevent STIs (sexually transmitted infections).  If you do not wish to become pregnant, use a form of birth control. If you plan to become pregnant, see your health care provider for a prepregnancy visit.  If told by your health care provider, take low-dose aspirin daily starting at age 20.  Find healthy ways to cope with stress, such as: ? Meditation, yoga, or listening to music. ? Journaling. ? Talking to a trusted person. ? Spending time with friends and family. Safety  Always wear your seat belt while driving or riding in a vehicle.  Do not drive: ? If you have been drinking alcohol. Do not ride with someone who has been drinking. ? When you are tired or distracted. ? While texting.  Wear a helmet and other protective equipment during sports activities.  If you have firearms in your house, make sure you follow all gun safety procedures. What's next?  Visit your health care provider once a year for an annual wellness visit.  Ask your health care provider how often you should have your eyes and teeth checked.  Stay up to date on all vaccines. This information is not intended to replace advice given to you by your health care provider. Make  sure you discuss any questions you have with your health care provider. Document Revised: 04/05/2020 Document Reviewed: 03/13/2018 Elsevier Patient Education  2021 [employer].

## 2020-12-02 NOTE — Assessment & Plan Note (Signed)
Continues to do better with Wellbutrin SR 100 mg BID, continue same.

## 2020-12-02 NOTE — Assessment & Plan Note (Signed)
Chronic, likely triggered by her smoking, caffeine use.   No improvement with OTC treatment. Rx for omeprazole 40 mg sent to pharmacy. She will update.

## 2020-12-02 NOTE — Assessment & Plan Note (Signed)
Immunizations UTD. Mammogram UTD. Pap smear due and pending. Colonoscopy UTD due in 2028.  Discussed the importance of a healthy diet and regular exercise in order for weight loss, and to reduce the risk of any potential medical problems.  Exam today as noted. Labs pending

## 2020-12-03 LAB — HEMOGLOBIN A1C
Hgb A1c MFr Bld: 5 % of total Hgb (ref <5.7)
Mean Plasma Glucose: 97 mg/dL
eAG (mmol/L): 5.4 mmol/L

## 2020-12-03 LAB — URINALYSIS, ROUTINE W REFLEX MICROSCOPIC
Bilirubin Urine: NEGATIVE
Glucose, UA: NEGATIVE
Hgb urine dipstick: NEGATIVE
Ketones, ur: NEGATIVE
Leukocytes,Ua: NEGATIVE
Nitrite: NEGATIVE
Protein, ur: NEGATIVE
Specific Gravity, Urine: 1.018 (ref 1.001–1.035)
pH: 5.5 (ref 5.0–8.0)

## 2020-12-05 LAB — CBC
HCT: 37.5 % (ref 35.0–45.0)
Hemoglobin: 12.5 g/dL (ref 11.7–15.5)
MCH: 33 pg (ref 27.0–33.0)
MCHC: 33.3 g/dL (ref 32.0–36.0)
MCV: 98.9 fL (ref 80.0–100.0)
MPV: 10.6 fL (ref 7.5–12.5)
Platelets: 233 10*3/uL (ref 140–400)
RBC: 3.79 10*6/uL — ABNORMAL LOW (ref 3.80–5.10)
RDW: 13.1 % (ref 11.0–15.0)
WBC: 4.9 10*3/uL (ref 3.8–10.8)

## 2020-12-05 LAB — URINE CULTURE
MICRO NUMBER:: 11916408
Result:: NO GROWTH
SPECIMEN QUALITY:: ADEQUATE

## 2020-12-05 LAB — COMPREHENSIVE METABOLIC PANEL
AG Ratio: 1.7 (calc) (ref 1.0–2.5)
ALT: 12 U/L (ref 6–29)
AST: 17 U/L (ref 10–35)
Albumin: 4.2 g/dL (ref 3.6–5.1)
Alkaline phosphatase (APISO): 62 U/L (ref 37–153)
BUN: 11 mg/dL (ref 7–25)
CO2: 21 mmol/L (ref 20–32)
Calcium: 9.3 mg/dL (ref 8.6–10.4)
Chloride: 108 mmol/L (ref 98–110)
Creat: 0.84 mg/dL (ref 0.50–1.05)
Globulin: 2.5 g/dL (calc) (ref 1.9–3.7)
Glucose, Bld: 92 mg/dL (ref 65–99)
Potassium: 3.7 mmol/L (ref 3.5–5.3)
Sodium: 141 mmol/L (ref 135–146)
Total Bilirubin: 0.3 mg/dL (ref 0.2–1.2)
Total Protein: 6.7 g/dL (ref 6.1–8.1)

## 2020-12-05 LAB — LIPID PANEL
Cholesterol: 192 mg/dL (ref <200)
HDL: 56 mg/dL (ref >=50)
LDL Cholesterol (Calc): 115 mg/dL (calc) — ABNORMAL HIGH
Non-HDL Cholesterol (Calc): 136 mg/dL (calc) — ABNORMAL HIGH (ref <130)
Total CHOL/HDL Ratio: 3.4 (calc) (ref <5.0)
Triglycerides: 107 mg/dL (ref <150)

## 2020-12-05 LAB — HEPATITIS C ANTIBODY
Hepatitis C Ab: NONREACTIVE
SIGNAL TO CUT-OFF: 0.01 (ref <1.00)

## 2020-12-05 LAB — HIV ANTIBODY (ROUTINE TESTING W REFLEX): HIV 1&2 Ab, 4th Generation: NONREACTIVE

## 2020-12-06 DIAGNOSIS — E785 Hyperlipidemia, unspecified: Secondary | ICD-10-CM

## 2020-12-06 MED ORDER — ATORVASTATIN CALCIUM 40 MG PO TABS: 40.0000 mg | ORAL_TABLET | Freq: Every day | ORAL | 3 refills | Status: DC

## 2020-12-07 LAB — CYTOLOGY - PAP
Comment: NEGATIVE
Diagnosis: NEGATIVE
High risk HPV: NEGATIVE

## 2020-12-22 ENCOUNTER — Other Ambulatory Visit: Payer: Self-pay

## 2020-12-22 DIAGNOSIS — I10 Essential (primary) hypertension: Secondary | ICD-10-CM

## 2020-12-22 DIAGNOSIS — Z720 Tobacco use: Secondary | ICD-10-CM

## 2020-12-26 ENCOUNTER — Other Ambulatory Visit: Payer: Self-pay

## 2020-12-26 DIAGNOSIS — I10 Essential (primary) hypertension: Secondary | ICD-10-CM

## 2020-12-26 MED ORDER — HYDROCHLOROTHIAZIDE 25 MG PO TABS: 25.0000 mg | ORAL_TABLET | Freq: Every day | ORAL | 1 refills | Status: DC

## 2020-12-27 MED ORDER — BUPROPION HCL ER (SR) 100 MG PO TB12: 100.0000 mg | ORAL_TABLET | Freq: Two times a day (BID) | ORAL | 3 refills | Status: DC

## 2020-12-27 MED ORDER — AMLODIPINE BESYLATE 10 MG PO TABS: 10.0000 mg | ORAL_TABLET | Freq: Every day | ORAL | 3 refills | Status: DC

## 2020-12-27 MED ORDER — LISINOPRIL 40 MG PO TABS: 40.0000 mg | ORAL_TABLET | Freq: Every day | ORAL | 3 refills | Status: DC

## 2021-01-23 ENCOUNTER — Other Ambulatory Visit: Payer: Self-pay | Admitting: Primary Care

## 2021-01-23 DIAGNOSIS — N951 Menopausal and female climacteric states: Secondary | ICD-10-CM

## 2021-03-02 ENCOUNTER — Other Ambulatory Visit: Payer: Self-pay

## 2021-03-02 DIAGNOSIS — K219 Gastro-esophageal reflux disease without esophagitis: Secondary | ICD-10-CM

## 2021-03-02 MED ORDER — OMEPRAZOLE 40 MG PO CPDR: 40.0000 mg | DELAYED_RELEASE_CAPSULE | Freq: Every day | ORAL | 0 refills | Status: DC

## 2021-03-24 ENCOUNTER — Other Ambulatory Visit: Payer: BC Managed Care – PPO

## 2021-04-21 ENCOUNTER — Telehealth: Payer: BC Managed Care – PPO

## 2021-04-21 NOTE — Telephone Encounter (Signed)
Transferred this info to pts chart Maureen Orozco.

## 2021-05-09 ENCOUNTER — Other Ambulatory Visit (INDEPENDENT_AMBULATORY_CARE_PROVIDER_SITE_OTHER): Payer: BC Managed Care – PPO

## 2021-05-09 DIAGNOSIS — E785 Hyperlipidemia, unspecified: Secondary | ICD-10-CM

## 2021-05-10 ENCOUNTER — Ambulatory Visit: Payer: BC Managed Care – PPO | Admitting: Primary Care

## 2021-05-10 LAB — LIPID PANEL
Cholesterol: 220 mg/dL — ABNORMAL HIGH (ref 0–200)
HDL: 55.7 mg/dL (ref >39.00)
LDL Cholesterol: 143 mg/dL — ABNORMAL HIGH (ref 0–99)
NonHDL: 163.89
Total CHOL/HDL Ratio: 4
Triglycerides: 103 mg/dL (ref 0.0–149.0)
VLDL: 20.6 mg/dL (ref 0.0–40.0)

## 2021-05-20 ENCOUNTER — Telehealth: Payer: BC Managed Care – PPO | Admitting: Nurse Practitioner

## 2021-05-20 DIAGNOSIS — J329 Chronic sinusitis, unspecified: Secondary | ICD-10-CM

## 2021-05-20 DIAGNOSIS — B9789 Other viral agents as the cause of diseases classified elsewhere: Secondary | ICD-10-CM | POA: Diagnosis not present

## 2021-05-20 MED ORDER — AMOXICILLIN-POT CLAVULANATE 875-125 MG PO TABS: 1.0000 | ORAL_TABLET | Freq: Two times a day (BID) | ORAL | 0 refills | Status: DC

## 2021-05-20 MED ORDER — FLUTICASONE PROPIONATE 50 MCG/ACT NA SUSP: 2.0000 | Freq: Every day | NASAL | 6 refills | Status: DC

## 2021-05-20 NOTE — Addendum Note (Signed)
Addended by: Chevis Pretty on: 05/20/2021 10:30 AM   Modules accepted: Orders

## 2021-05-20 NOTE — Progress Notes (Signed)
E-Visit for Sinus Problems ? ?We are sorry that you are not feeling well.  Here is how we plan to help! ? ?Based on what you have shared with me it looks like you have sinusitis.  Sinusitis is inflammation and infection in the sinus cavities of the head.  Based on your presentation I believe you most likely have Acute Viral Sinusitis.This is an infection most likely caused by a virus. There is not specific treatment for viral sinusitis other than to help you with the symptoms until the infection runs its course.  You may use an oral decongestant such as Mucinex D or if you have glaucoma or high blood pressure use plain Mucinex. Saline nasal spray help and can safely be used as often as needed for congestion, I have prescribed: Fluticasone nasal spray two sprays in each nostril once a day ? ?Some authorities believe that zinc sprays or the use of Echinacea may shorten the course of your symptoms. ? ?Sinus infections are not as easily transmitted as other respiratory infection, however we still recommend that you avoid close contact with loved ones, especially the very young and elderly.  Remember to wash your hands thoroughly throughout the day as this is the number one way to prevent the spread of infection! ? ?Home Care: ?Only take medications as instructed by your medical team. ?Do not take these medications with alcohol. ?A steam or ultrasonic humidifier can help congestion.  You can place a towel over your head and breathe in the steam from hot water coming from a faucet. ?Avoid close contacts especially the very young and the elderly. ?Cover your mouth when you cough or sneeze. ?Always remember to wash your hands. ? ?Get Help Right Away If: ?You develop worsening fever or sinus pain. ?You develop a severe head ache or visual changes. ?Your symptoms persist after you have completed your treatment plan. ? ?Make sure you ?Understand these instructions. ?Will watch your condition. ?Will get help right away if you  are not doing well or get worse. ? ? ?Thank you for choosing an e-visit. ? ?Your e-visit answers were reviewed by a board certified advanced clinical practitioner to complete your personal care plan. Depending upon the condition, your plan could have included both over the counter or prescription medications. ? ?Please review your pharmacy choice. Make sure the pharmacy is open so you can pick up prescription now. If there is a problem, you may contact your provider through MyChart messaging and have the prescription routed to another pharmacy.  Your safety is important to us. If you have drug allergies check your prescription carefully.  ? ?For the next 24 hours you can use MyChart to ask questions about today's visit, request a non-urgent call back, or ask for a work or school excuse. ?You will get an email in the next two days asking about your experience. I hope that your e-visit has been valuable and will speed your recovery. ? ?5-10 minutes spent reviewing and documenting in chart. ? ?

## 2021-06-02 ENCOUNTER — Ambulatory Visit: Payer: BC Managed Care – PPO | Admitting: Primary Care

## 2021-06-02 ENCOUNTER — Other Ambulatory Visit: Payer: Self-pay | Admitting: Primary Care

## 2021-06-02 ENCOUNTER — Encounter: Payer: Self-pay | Admitting: Primary Care

## 2021-06-02 ENCOUNTER — Other Ambulatory Visit: Payer: Self-pay

## 2021-06-02 VITALS — BP 150/88 | HR 108 | Temp 97.8°F | Ht 66.0 in | Wt 171.0 lb

## 2021-06-02 DIAGNOSIS — Z636 Dependent relative needing care at home: Secondary | ICD-10-CM | POA: Diagnosis not present

## 2021-06-02 DIAGNOSIS — K219 Gastro-esophageal reflux disease without esophagitis: Secondary | ICD-10-CM

## 2021-06-02 DIAGNOSIS — I10 Essential (primary) hypertension: Secondary | ICD-10-CM

## 2021-06-02 DIAGNOSIS — N951 Menopausal and female climacteric states: Secondary | ICD-10-CM

## 2021-06-02 DIAGNOSIS — Z23 Encounter for immunization: Secondary | ICD-10-CM

## 2021-06-02 DIAGNOSIS — R300 Dysuria: Secondary | ICD-10-CM

## 2021-06-02 LAB — POC URINALSYSI DIPSTICK (AUTOMATED)
Bilirubin, UA: NEGATIVE
Blood, UA: NEGATIVE
Glucose, UA: NEGATIVE
Ketones, UA: NEGATIVE
Nitrite, UA: NEGATIVE
Protein, UA: POSITIVE — AB
Spec Grav, UA: 1.025 (ref 1.010–1.025)
Urobilinogen, UA: 0.2 E.U./dL
pH, UA: 6 (ref 5.0–8.0)

## 2021-06-02 MED ORDER — OMEPRAZOLE 40 MG PO CPDR: 40.0000 mg | DELAYED_RELEASE_CAPSULE | Freq: Every day | ORAL | 1 refills | Status: DC

## 2021-06-02 MED ORDER — VENLAFAXINE HCL ER 75 MG PO CP24: 75.0000 mg | ORAL_CAPSULE | Freq: Every day | ORAL | 1 refills | Status: DC

## 2021-06-02 NOTE — Progress Notes (Signed)
Subjective:    Patient ID: Maureen Orozco, female    DOB: 27-Aug-1965, 55 y.o.   MRN: [id number]  HPI  Maureen Orozco is a very pleasant 55 y.o. female with a history of hypertension, GERD, tobacco abuse, hyperlipidemia, cystitis, prediabetes, who presents today to discuss several issues. She is also needing a refill of her omeprazole.   1) Anxiety: Acute for the last 2 weeks.Today she endorses feeling a lot of stress as her husband has an ulcer and bone infection to his only remaining leg. She cares for her husband as he is on disability, also works part time and having a hard time managing both.   Currently managed on venlafaxine XR 37.5 mg for hot flashes and bupropion SR 100 mg BID for tobacco abuse. Has done well with both medications. No side effects.  Symptoms include feeling worried, stressed, fatigued, feeling down/sad.   2) Dysuria: She denies increased frequency, urgency, pelvic pressure, hematuria.  Symptoms began about one week ago. History of cystitis. Today she began to feel improved.    Review of Systems  Respiratory:  Negative for shortness of breath.   Cardiovascular:  Negative for chest pain.  Genitourinary:  Positive for dysuria. Negative for flank pain, hematuria and vaginal discharge.  Psychiatric/Behavioral:  The patient is nervous/anxious.         Past Medical History:  Diagnosis Date   Essential hypertension    Hyperlipidemia     Social History   Socioeconomic History   Marital status: Married    Spouse name: Not on file   Number of children: Not on file   Years of education: Not on file   Highest education level: Not on file  Occupational History   Not on file  Tobacco Use   Smoking status: Every Day    Packs/day: 0.25    Types: Cigarettes   Smokeless tobacco: Never  Vaping Use   Vaping Use: Some days  Substance and Sexual Activity   Alcohol use: No    Alcohol/week: 0.0 standard drinks   Drug use: Not Currently   Sexual activity: Not  on file  Other Topics Concern   Not on file  Social History Narrative   Married.   Works at [employer] in [place].   Has one child.   Enjoys playing computer games, watching TV.   Social Determinants of Health   Financial Resource Strain: Not on file  Food Insecurity: Not on file  Transportation Needs: Not on file  Physical Activity: Not on file  Stress: Not on file  Social Connections: Not on file  Intimate Partner Violence: Not on file    Past Surgical History:  Procedure Laterality Date   BREAST BIOPSY Left 03/09/2016    hyalinized fibroadenoma    COLONOSCOPY  10/30/2019   DENTAL SURGERY      Family History  Adopted: Yes    No Known Allergies  Current Outpatient Medications on File Prior to Visit  Medication Sig Dispense Refill   amLODipine (NORVASC) 10 MG tablet Take 1 tablet (10 mg total) by mouth daily. for high blood pressure 90 tablet 3   atorvastatin (LIPITOR) 40 MG tablet Take 1 tablet (40 mg total) by mouth daily. For cholesterol 90 tablet 3   fluticasone (FLONASE) 50 MCG/ACT nasal spray Place 2 sprays into both nostrils daily. 16 g 6   hydrochlorothiazide (HYDRODIURIL) 25 MG tablet Take 1 tablet (25 mg total) by mouth daily. For blood pressure. 90 tablet 1   lisinopril (  ZESTRIL) 40 MG tablet Take 1 tablet (40 mg total) by mouth daily. For blood pressure. 90 tablet 3   omeprazole (PRILOSEC) 40 MG capsule Take 1 capsule (40 mg total) by mouth daily. For heartburn. 90 capsule 0   venlafaxine XR (EFFEXOR-XR) 37.5 MG 24 hr capsule TAKE 1 CAPSULE BY MOUTH EVERY MORNING FOR HOT FLASHES 90 capsule 2   buPROPion (WELLBUTRIN SR) 100 MG 12 hr tablet Take 1 tablet (100 mg total) by mouth 2 (two) times daily. For tobacco cessation. (Patient not taking: Reported on 06/02/2021) 180 tablet 3   No current facility-administered medications on file prior to visit.    BP (!) 150/88   Pulse (!) 108   Temp 97.8 F (36.6 C) (Temporal)   Ht 5\' 6"  (1.676 m)   Wt 171 lb  (77.6 kg)   SpO2 98%   BMI 27.60 kg/m  Objective:   Physical Exam Cardiovascular:     Rate and Rhythm: Normal rate and regular rhythm.  Pulmonary:     Effort: Pulmonary effort is normal.     Breath sounds: Normal breath sounds.  Musculoskeletal:     Cervical back: Neck supple.  Skin:    General: Skin is warm and dry.  Psychiatric:     Comments: Tearful throughout exam          Assessment & Plan:      This visit occurred during the SARS-CoV-2 public health emergency.  Safety protocols were in place, including screening questions prior to the visit, additional usage of staff PPE, and extensive cleaning of exam room while observing appropriate contact time as indicated for disinfecting solutions.

## 2021-06-02 NOTE — Assessment & Plan Note (Signed)
Unfortunate situation and she is overwhelmed.  Empathy provided.   Discussed options, will start with dose increase of venlafaxine to ER 75 mg. She agrees. New Rx sent to pharmacy.  She will update in about 4 weeks.  Continue bupropion SR 100 mg BID.

## 2021-06-02 NOTE — Assessment & Plan Note (Signed)
Improving. UA today with trace leuks, 1+ protein. Culture pending. Await results.   Encouraged water intake.

## 2021-06-02 NOTE — Patient Instructions (Signed)
We increased your dose of venlafaxine ER to 75 mg. I sent a new prescription to your pharmacy.   We will be in touch regarding your urine test results.  Please update me in 4 weeks via MyChart.   It was a pleasure to see you today!

## 2021-06-02 NOTE — Assessment & Plan Note (Signed)
Doing well on omeprazole 40 mg, continue same.  Refills provided.

## 2021-06-03 LAB — URINE CULTURE
MICRO NUMBER:: 12657411
Result:: NO GROWTH
SPECIMEN QUALITY:: ADEQUATE

## 2021-06-15 MED ORDER — OLMESARTAN MEDOXOMIL 40 MG PO TABS: 40.0000 mg | ORAL_TABLET | Freq: Every day | ORAL | 0 refills | Status: DC

## 2021-06-22 ENCOUNTER — Other Ambulatory Visit: Payer: Self-pay | Admitting: Primary Care

## 2021-06-22 DIAGNOSIS — I10 Essential (primary) hypertension: Secondary | ICD-10-CM

## 2021-07-06 ENCOUNTER — Encounter: Payer: Self-pay | Admitting: Primary Care

## 2021-07-06 ENCOUNTER — Ambulatory Visit: Payer: BC Managed Care – PPO | Admitting: Primary Care

## 2021-07-06 ENCOUNTER — Other Ambulatory Visit: Payer: Self-pay

## 2021-07-06 VITALS — BP 130/82 | HR 96 | Temp 97.9°F | Ht 66.0 in | Wt 169.0 lb

## 2021-07-06 DIAGNOSIS — R519 Headache, unspecified: Secondary | ICD-10-CM

## 2021-07-06 DIAGNOSIS — I10 Essential (primary) hypertension: Secondary | ICD-10-CM

## 2021-07-06 DIAGNOSIS — E785 Hyperlipidemia, unspecified: Secondary | ICD-10-CM

## 2021-07-06 DIAGNOSIS — Z636 Dependent relative needing care at home: Secondary | ICD-10-CM

## 2021-07-06 MED ORDER — PROPRANOLOL HCL ER 80 MG PO CP24: 80.0000 mg | ORAL_CAPSULE | Freq: Every day | ORAL | 0 refills | Status: DC

## 2021-07-06 NOTE — Assessment & Plan Note (Signed)
Improved since dose adjustment of venlafaxine ER to 75 mg. Continue same.

## 2021-07-06 NOTE — Assessment & Plan Note (Signed)
No improvement despite better management of BP and reduction of anxiety/stress.  Trial of propranolol ER 80 mg HS sent to pharmacy. She will update in 2-4 weeks.

## 2021-07-06 NOTE — Assessment & Plan Note (Signed)
Improved on new regimen of olmesartan 40 mg, amlodipine 10 mg, HCTZ 25 mg. Continue same.

## 2021-07-06 NOTE — Patient Instructions (Signed)
Continue taking olmesartan 40 mg, amlodipine 10 mg, and hydrochlorothiazide 25 mg daily for blood pressure.  Start taking propranolol ER 80 mg at bedtime for headache prevention.  Please update me via MyChart in 2-4 weeks regarding your headaches.   It was a pleasure to see you today!

## 2021-07-06 NOTE — Progress Notes (Signed)
Subjective:    Patient ID: Maureen Orozco, female    DOB: 22-Jan-1966, 55 y.o.   MRN: [id number]  HPI  Maureen Orozco is a very pleasant 55 y.o. female with a history of tobacco abuse, hypertension, GAD, GERD, caregiver stress who presents today for follow up of hypertension, headaches, and caregiver stress.  She was last evaluated on 06/02/21 for anxiety and dysuria. BP was noted to be above goal during this visit and other visits despite management on amlodipine 10 mg, lisinopril 40 mg, HCTZ 25 mg.   We asked her to track home BP readings and she responded via MyChart with continued elevated readings so we altered her BP regimen. She is now managed on olmesartan 40 mg, HCTZ 25 mg, and amlodipine 10 mg. No longer on lisinopril. She was asked to follow up today for repeat BP check.  Since her last visit she is compliant to olmesartan 40 mg, amlodipine 10 mg, and HCTZ 25 mg. She is checking BP at home which is running 130's-150's/80's-90's, mostly 130's/80's.   She continues to notice headaches to the frontal lobes, bilaterally, no improvement with reduction in BP. Headaches are daily now, increased over the last 6 months. She takes Advil 800 mg twice daily, several days weekly with temporary improvement. She denies a history of migraines, nausea, photophobia, phonophobia.   She has noticed stress reduction with the dose increase of her venlafaxine XR to 75 mg. Overall feeling better. She is not taking Wellbutrin as she cannot afford at this time.   BP Readings from Last 3 Encounters:  07/06/21 130/82  06/02/21 (!) 150/88  12/02/20 (!) 178/88           Past Medical History:  Diagnosis Date   Acute viral sinusitis 03/10/2020   Essential hypertension    Hyperlipidemia     Social History   Socioeconomic History   Marital status: Married    Spouse name: Not on file   Number of children: Not on file   Years of education: Not on file   Highest education level: Not on file   Occupational History   Not on file  Tobacco Use   Smoking status: Every Day    Packs/day: 0.25    Types: Cigarettes   Smokeless tobacco: Never  Vaping Use   Vaping Use: Some days  Substance and Sexual Activity   Alcohol use: No    Alcohol/week: 0.0 standard drinks   Drug use: Not Currently   Sexual activity: Not on file  Other Topics Concern   Not on file  Social History Narrative   Married.   Works at [employer] in [place].   Has one child.   Enjoys playing computer games, watching TV.   Social Determinants of Health   Financial Resource Strain: Not on file  Food Insecurity: Not on file  Transportation Needs: Not on file  Physical Activity: Not on file  Stress: Not on file  Social Connections: Not on file  Intimate Partner Violence: Not on file    Past Surgical History:  Procedure Laterality Date   BREAST BIOPSY Left 03/09/2016    hyalinized fibroadenoma    COLONOSCOPY  10/30/2019   DENTAL SURGERY      Family History  Adopted: Yes    No Known Allergies  Current Outpatient Medications on File Prior to Visit  Medication Sig Dispense Refill   amLODipine (NORVASC) 10 MG tablet Take 1 tablet (10 mg total) by mouth daily. for high blood pressure 90  tablet 3   atorvastatin (LIPITOR) 40 MG tablet Take 1 tablet (40 mg total) by mouth daily. For cholesterol 90 tablet 3   fluticasone (FLONASE) 50 MCG/ACT nasal spray Place 2 sprays into both nostrils daily. 16 g 6   hydrochlorothiazide (HYDRODIURIL) 25 MG tablet Take 1 tablet by mouth once daily for blood pressure 90 tablet 3   olmesartan (BENICAR) 40 MG tablet Take 1 tablet (40 mg total) by mouth daily. For blood pressure 90 tablet 0   omeprazole (PRILOSEC) 40 MG capsule Take 1 capsule (40 mg total) by mouth daily. For heartburn. 90 capsule 1   venlafaxine XR (EFFEXOR XR) 75 MG 24 hr capsule Take 1 capsule (75 mg total) by mouth daily with breakfast. For anxiety and hot flashes. 90 capsule 1   buPROPion (WELLBUTRIN  SR) 100 MG 12 hr tablet Take 1 tablet (100 mg total) by mouth 2 (two) times daily. For tobacco cessation. (Patient not taking: Reported on 06/02/2021) 180 tablet 3   No current facility-administered medications on file prior to visit.    BP 130/82    Pulse 96    Temp 97.9 F (36.6 C)    Ht 5\' 6"  (1.676 m)    Wt 169 lb (76.7 kg)    SpO2 96%    BMI 27.28 kg/m  Objective:   Physical Exam Cardiovascular:     Rate and Rhythm: Normal rate and regular rhythm.  Pulmonary:     Effort: Pulmonary effort is normal.     Breath sounds: Normal breath sounds.  Musculoskeletal:     Cervical back: Neck supple.  Skin:    General: Skin is warm and dry.          Assessment & Plan:      This visit occurred during the SARS-CoV-2 public health emergency.  Safety protocols were in place, including screening questions prior to the visit, additional usage of staff PPE, and extensive cleaning of exam room while observing appropriate contact time as indicated for disinfecting solutions.

## 2021-07-12 ENCOUNTER — Other Ambulatory Visit (INDEPENDENT_AMBULATORY_CARE_PROVIDER_SITE_OTHER): Payer: BC Managed Care – PPO

## 2021-07-12 ENCOUNTER — Other Ambulatory Visit: Payer: Self-pay

## 2021-07-12 DIAGNOSIS — E785 Hyperlipidemia, unspecified: Secondary | ICD-10-CM

## 2021-07-12 LAB — LIPID PANEL
Cholesterol: 155 mg/dL (ref 0–200)
HDL: 48.6 mg/dL (ref >39.00)
LDL Cholesterol: 91 mg/dL (ref 0–99)
NonHDL: 106.36
Total CHOL/HDL Ratio: 3
Triglycerides: 75 mg/dL (ref 0.0–149.0)
VLDL: 15 mg/dL (ref 0.0–40.0)

## 2021-11-29 ENCOUNTER — Other Ambulatory Visit: Payer: Self-pay | Admitting: Primary Care

## 2021-11-29 DIAGNOSIS — K219 Gastro-esophageal reflux disease without esophagitis: Secondary | ICD-10-CM

## 2021-11-29 DIAGNOSIS — E785 Hyperlipidemia, unspecified: Secondary | ICD-10-CM

## 2021-11-29 DIAGNOSIS — N951 Menopausal and female climacteric states: Secondary | ICD-10-CM

## 2021-11-29 DIAGNOSIS — Z636 Dependent relative needing care at home: Secondary | ICD-10-CM

## 2021-12-19 ENCOUNTER — Encounter: Payer: BC Managed Care – PPO | Admitting: Primary Care

## 2022-02-12 ENCOUNTER — Other Ambulatory Visit: Payer: Self-pay | Admitting: Primary Care

## 2022-02-12 DIAGNOSIS — K219 Gastro-esophageal reflux disease without esophagitis: Secondary | ICD-10-CM

## 2022-03-09 ENCOUNTER — Ambulatory Visit (INDEPENDENT_AMBULATORY_CARE_PROVIDER_SITE_OTHER): Payer: BC Managed Care – PPO | Admitting: Primary Care

## 2022-03-09 ENCOUNTER — Encounter: Payer: Self-pay | Admitting: Primary Care

## 2022-03-09 VITALS — BP 140/88 | HR 104 | Temp 98.6°F | Resp 16 | Ht 66.0 in | Wt 158.5 lb

## 2022-03-09 DIAGNOSIS — I10 Essential (primary) hypertension: Secondary | ICD-10-CM

## 2022-03-09 DIAGNOSIS — K219 Gastro-esophageal reflux disease without esophagitis: Secondary | ICD-10-CM | POA: Diagnosis not present

## 2022-03-09 DIAGNOSIS — Z636 Dependent relative needing care at home: Secondary | ICD-10-CM | POA: Diagnosis not present

## 2022-03-09 DIAGNOSIS — N951 Menopausal and female climacteric states: Secondary | ICD-10-CM | POA: Diagnosis not present

## 2022-03-09 DIAGNOSIS — R519 Headache, unspecified: Secondary | ICD-10-CM

## 2022-03-09 DIAGNOSIS — E785 Hyperlipidemia, unspecified: Secondary | ICD-10-CM

## 2022-03-09 DIAGNOSIS — Z720 Tobacco use: Secondary | ICD-10-CM

## 2022-03-09 NOTE — Assessment & Plan Note (Signed)
Controlled.  Continue venlafaxine ER 75 mg daily. Continue to monitor.

## 2022-03-09 NOTE — Patient Instructions (Signed)
Stop by the lab prior to leaving today. I will notify you of your results once received.   Start propanolol LA 80 mg at bedtime for headache prevention.  Update me in a few weeks.  It was a pleasure to see you today!

## 2022-03-09 NOTE — Assessment & Plan Note (Signed)
Controlled per patient.  Continue venlafaxine ER 75 mg daily.

## 2022-03-09 NOTE — Assessment & Plan Note (Signed)
Above goal today, home readings are lower.  Continue hydrochlorothiazide 25 mg daily, amlodipine 10 mg daily, olmesartan 40 mg daily. BMP pending.

## 2022-03-09 NOTE — Progress Notes (Signed)
Subjective:    Patient ID: Maureen Orozco, female    DOB: 1965-08-26, 56 y.o.   MRN: [id number]  Medication Refill Associated symptoms include arthralgias and headaches. Pertinent negatives include no chest pain.  Leg Pain     Maureen Orozco is a very pleasant 56 y.o. female with a history of hypertension, vasomotor perimenopausal symptoms, GERD, tobacco abuse, hyperlipidemia, prediabetes, caregiver stress, frequent headaches who presents today for follow-up of chronic conditions.  1) Essential Hypertension: Currently managed on amlodipine 10 mg daily, hydrochlorothiazide 25 mg daily, olmesartan 40 mg daily.  She is checking her BP at home which is running 130's/80's. She denies chest pain, dizziness.   BP Readings from Last 3 Encounters:  03/09/22 (!) 140/88  07/06/21 130/82  06/02/21 (!) 150/88   2) Frequent headaches: Currently managed on propanolol ER 80 mg daily. She has yet to start propranolol as she forgot to pick them up from the pharmacy. She continues to experience daily headaches.   3) Caregiver Stress/Vasomotor symptoms: Currently managed on venlafaxine ER 75 mg daily.  She is also managed on bupropion SR 100 mg for smoking cessation.   She has not been taking bupropion in a few months. She plans on quitting today, will be using nicotine lozenges today.   4) GERD: Currently managed on omeprazole 40 mg daily. She feels well managed overall.    Review of Systems  Respiratory:  Negative for shortness of breath.   Cardiovascular:  Negative for chest pain.  Musculoskeletal:  Positive for arthralgias and back pain.  Neurological:  Positive for headaches. Negative for dizziness.  Psychiatric/Behavioral:  The patient is not nervous/anxious.          Past Medical History:  Diagnosis Date   Acute viral sinusitis 03/10/2020   Essential hypertension    Hyperlipidemia     Social History   Socioeconomic History   Marital status: Married    Spouse name: Not on  file   Number of children: Not on file   Years of education: Not on file   Highest education level: Not on file  Occupational History   Not on file  Tobacco Use   Smoking status: Every Day    Packs/day: 0.25    Types: Cigarettes   Smokeless tobacco: Never  Vaping Use   Vaping Use: Some days  Substance and Sexual Activity   Alcohol use: No    Alcohol/week: 0.0 standard drinks of alcohol   Drug use: Not Currently   Sexual activity: Not on file  Other Topics Concern   Not on file  Social History Narrative   Married.   Works at [employer] in [place].   Has one child.   Enjoys playing computer games, watching TV.   Social Determinants of Health   Financial Resource Strain: Not on file  Food Insecurity: Not on file  Transportation Needs: Not on file  Physical Activity: Not on file  Stress: Not on file  Social Connections: Not on file  Intimate Partner Violence: Not on file    Past Surgical History:  Procedure Laterality Date   BREAST BIOPSY Left 03/09/2016    hyalinized fibroadenoma    COLONOSCOPY  10/30/2019   DENTAL SURGERY      Family History  Adopted: Yes    No Known Allergies  Current Outpatient Medications on File Prior to Visit  Medication Sig Dispense Refill   amLODipine (NORVASC) 10 MG tablet Take 1 tablet (10 mg total) by mouth daily. for high blood  pressure 90 tablet 3   atorvastatin (LIPITOR) 40 MG tablet Take 1 tablet (40 mg total) by mouth daily. for cholesterol. Office visit required for further refills. 90 tablet 0   hydrochlorothiazide (HYDRODIURIL) 25 MG tablet Take 1 tablet by mouth once daily for blood pressure 90 tablet 3   olmesartan (BENICAR) 40 MG tablet Take 1 tablet (40 mg total) by mouth daily. For blood pressure 90 tablet 0   omeprazole (PRILOSEC) 40 MG capsule TAKE 1 CAPSULE BY MOUTH DAILY FOR HEARTBURN 90 capsule 0   propranolol ER (INDERAL LA) 80 MG 24 hr capsule Take 1 capsule (80 mg total) by mouth at bedtime. For headache  prevention. 90 capsule 0   venlafaxine XR (EFFEXOR-XR) 75 MG 24 hr capsule Take 1 capsule (75 mg total) by mouth daily with breakfast. For anxiety and hot flashes. Office visit required for further refills. 90 capsule 0   No current facility-administered medications on file prior to visit.    BP (!) 140/88   Pulse (!) 104   Temp 98.6 F (37 C)   Resp 16   Ht '5\' 6"'$  (1.676 m)   Wt 158 lb 8 oz (71.9 kg)   SpO2 97%   BMI 25.58 kg/m  Objective:   Physical Exam Cardiovascular:     Rate and Rhythm: Normal rate and regular rhythm.  Pulmonary:     Effort: Pulmonary effort is normal.     Breath sounds: Normal breath sounds.  Musculoskeletal:     Cervical back: Neck supple.  Skin:    General: Skin is warm and dry.  Neurological:     Mental Status: She is alert and oriented to person, place, and time.  Psychiatric:        Mood and Affect: Mood normal.           Assessment & Plan:   Problem List Items Addressed This Visit       Cardiovascular and Mediastinum   Essential hypertension - Primary    Above goal today, home readings are lower.  Continue hydrochlorothiazide 25 mg daily, amlodipine 10 mg daily, olmesartan 40 mg daily. BMP pending.      Relevant Orders   Comprehensive metabolic panel   Perimenopausal vasomotor symptoms    Controlled.  Continue venlafaxine ER 75 mg daily. Continue to monitor.         Digestive   Gastroesophageal reflux disease    Controlled.  Continue omeprazole 40 mg daily.        Other   Tobacco abuse    Off of bupropion for months, remain off per patient decision.   Commended her on her choice to quit today.       Hyperlipidemia    Controlled.   Reviewed lipid panel from December 2022. Continue atorvastatin 40 mg daily.      Caregiver stress    Controlled per patient.  Continue venlafaxine ER 75 mg daily.      Frequent headaches    Never started propranolol.  She plans on starting soon.  Discussed to take 1  capsule PO HS.  She will update a few weeks after initiation.           Pleas Koch, NP

## 2022-03-09 NOTE — Assessment & Plan Note (Signed)
Off of bupropion for months, remain off per patient decision.   Commended her on her choice to quit today.

## 2022-03-09 NOTE — Assessment & Plan Note (Signed)
Never started propranolol.  She plans on starting soon.  Discussed to take 1 capsule PO HS.  She will update a few weeks after initiation.

## 2022-03-09 NOTE — Assessment & Plan Note (Signed)
Controlled.   Reviewed lipid panel from December 2022. Continue atorvastatin 40 mg daily.

## 2022-03-09 NOTE — Assessment & Plan Note (Signed)
Controlled.  Continue omeprazole 40 mg daily. 

## 2022-03-10 LAB — COMPREHENSIVE METABOLIC PANEL
AG Ratio: 1.8 (calc) (ref 1.0–2.5)
ALT: 14 U/L (ref 6–29)
AST: 16 U/L (ref 10–35)
Albumin: 4.6 g/dL (ref 3.6–5.1)
Alkaline phosphatase (APISO): 77 U/L (ref 37–153)
BUN: 13 mg/dL (ref 7–25)
CO2: 18 mmol/L — ABNORMAL LOW (ref 20–32)
Calcium: 9.8 mg/dL (ref 8.6–10.4)
Chloride: 106 mmol/L (ref 98–110)
Creat: 1.02 mg/dL (ref 0.50–1.03)
Globulin: 2.5 g/dL (calc) (ref 1.9–3.7)
Glucose, Bld: 113 mg/dL — ABNORMAL HIGH (ref 65–99)
Potassium: 3.7 mmol/L (ref 3.5–5.3)
Sodium: 140 mmol/L (ref 135–146)
Total Bilirubin: 0.4 mg/dL (ref 0.2–1.2)
Total Protein: 7.1 g/dL (ref 6.1–8.1)

## 2022-03-15 ENCOUNTER — Other Ambulatory Visit: Payer: Self-pay | Admitting: Primary Care

## 2022-03-15 DIAGNOSIS — Z636 Dependent relative needing care at home: Secondary | ICD-10-CM

## 2022-03-15 DIAGNOSIS — N951 Menopausal and female climacteric states: Secondary | ICD-10-CM

## 2022-03-15 DIAGNOSIS — K219 Gastro-esophageal reflux disease without esophagitis: Secondary | ICD-10-CM

## 2022-03-15 MED ORDER — VENLAFAXINE HCL ER 75 MG PO CP24: 75.0000 mg | ORAL_CAPSULE | Freq: Every day | ORAL | 3 refills | Status: DC

## 2022-03-15 NOTE — Telephone Encounter (Signed)
I have sent message to call pharmacy about omeprazole that was called in.   Last refill of Effexor you wanted her to follow up by she has been seen after that refill.

## 2022-03-15 NOTE — Addendum Note (Signed)
Addended by: Pleas Koch on: 03/15/2022 06:13 PM   Modules accepted: Orders

## 2022-03-15 NOTE — Telephone Encounter (Signed)
Refill(s) for venlafaxine sent to pharmacy.

## 2022-04-27 ENCOUNTER — Ambulatory Visit (INDEPENDENT_AMBULATORY_CARE_PROVIDER_SITE_OTHER): Payer: Self-pay | Admitting: Primary Care

## 2022-04-27 ENCOUNTER — Other Ambulatory Visit: Payer: Self-pay | Admitting: Primary Care

## 2022-04-27 ENCOUNTER — Encounter: Payer: Self-pay | Admitting: Primary Care

## 2022-04-27 ENCOUNTER — Ambulatory Visit (HOSPITAL_BASED_OUTPATIENT_CLINIC_OR_DEPARTMENT_OTHER)
Admission: RE | Admit: 2022-04-27 | Discharge: 2022-04-27 | Disposition: A | Discharge status: Home / Self Care | Payer: BC Managed Care – PPO | Source: Ambulatory Visit | Attending: Primary Care | Admitting: Primary Care

## 2022-04-27 DIAGNOSIS — R1031 Right lower quadrant pain: Secondary | ICD-10-CM

## 2022-04-27 DIAGNOSIS — R109 Unspecified abdominal pain: Secondary | ICD-10-CM | POA: Diagnosis not present

## 2022-04-27 HISTORY — DX: Right lower quadrant pain: R10.31

## 2022-04-27 LAB — BASIC METABOLIC PANEL
BUN: 8 mg/dL (ref 6–23)
CO2: 26 mEq/L (ref 19–32)
Calcium: 9.6 mg/dL (ref 8.4–10.5)
Chloride: 105 mEq/L (ref 96–112)
Creatinine, Ser: 0.89 mg/dL (ref 0.40–1.20)
GFR: 73.7 mL/min (ref >60.00)
Glucose, Bld: 117 mg/dL — ABNORMAL HIGH (ref 70–99)
Potassium: 3.4 mEq/L — ABNORMAL LOW (ref 3.5–5.1)
Sodium: 139 mEq/L (ref 135–145)

## 2022-04-27 LAB — POC URINALSYSI DIPSTICK (AUTOMATED)
Bilirubin, UA: NEGATIVE
Blood, UA: NEGATIVE
Glucose, UA: NEGATIVE
Ketones, UA: NEGATIVE
Leukocytes, UA: NEGATIVE
Nitrite, UA: NEGATIVE
Protein, UA: NEGATIVE
Spec Grav, UA: 1.015 (ref 1.010–1.025)
Urobilinogen, UA: 0.2 E.U./dL
pH, UA: 5.5 (ref 5.0–8.0)

## 2022-04-27 LAB — CBC WITH DIFFERENTIAL/PLATELET
Basophils Absolute: 0 10*3/uL (ref 0.0–0.1)
Basophils Relative: 0.8 % (ref 0.0–3.0)
Eosinophils Absolute: 0.1 10*3/uL (ref 0.0–0.7)
Eosinophils Relative: 1.1 % (ref 0.0–5.0)
HCT: 40.5 % (ref 36.0–46.0)
Hemoglobin: 13.7 g/dL (ref 12.0–15.0)
Lymphocytes Relative: 15.8 % (ref 12.0–46.0)
Lymphs Abs: 0.9 10*3/uL (ref 0.7–4.0)
MCHC: 33.7 g/dL (ref 30.0–36.0)
MCV: 100 fl (ref 78.0–100.0)
Monocytes Absolute: 0.3 10*3/uL (ref 0.1–1.0)
Monocytes Relative: 4.5 % (ref 3.0–12.0)
Neutro Abs: 4.6 10*3/uL (ref 1.4–7.7)
Neutrophils Relative %: 77.8 % — ABNORMAL HIGH (ref 43.0–77.0)
Platelets: 228 10*3/uL (ref 150.0–400.0)
RBC: 4.05 Mil/uL (ref 3.87–5.11)
RDW: 13.7 % (ref 11.5–15.5)
WBC: 5.8 10*3/uL (ref 4.0–10.5)

## 2022-04-27 MED ORDER — IOHEXOL 300 MG/ML  SOLN
100.0000 mL | Freq: Once | INTRAMUSCULAR | Status: AC | PRN
Start: 2022-04-27 — End: 2022-04-27
  Administered 2022-04-27: 100 mL via INTRAVENOUS

## 2022-04-27 NOTE — Progress Notes (Signed)
Subjective:    Patient ID: Maureen Orozco, female    DOB: [birth year], 56 y.o.   MRN: [id number]  HPI  Maureen Orozco is a very pleasant 56 y.o. female with a history of hypertension, low back pain with left sided sciatica, tobacco abuse, prediabetes, cystitis, caregiver stress who presents today to discuss abdominal pain.  Her pain is located to the right lower abdomen/right upper pelvis which began about 2-3 weeks ago. Her pain is constant, improved with laying down, worse when moving around. She describes her pain as stabbing. Her pain is about the same.  She's noticed some constipation, last bowel movement was four days ago, smaller than usual. She began taking Miralax and Colace three days ago without a bowel movement.   She denies nausea, vomiting, diarrhea, vaginal bleeding, urinary symptoms, decreased appetite, radiation of pain. Her pain is not worse with eating or drinking. She drinks little water.   Review of Systems  Constitutional:  Negative for appetite change and fever.  Gastrointestinal:  Positive for abdominal pain and constipation. Negative for blood in stool, diarrhea, nausea and vomiting.  Genitourinary:  Negative for dysuria, flank pain, hematuria and vaginal bleeding.         Past Medical History:  Diagnosis Date   Acute viral sinusitis 03/10/2020   Essential hypertension    Hyperlipidemia     Social History   Socioeconomic History   Marital status: Married    Spouse name: Not on file   Number of children: Not on file   Years of education: Not on file   Highest education level: Not on file  Occupational History   Not on file  Tobacco Use   Smoking status: Every Day    Packs/day: 0.25    Types: Cigarettes   Smokeless tobacco: Never  Vaping Use   Vaping Use: Some days  Substance and Sexual Activity   Alcohol use: No    Alcohol/week: 0.0 standard drinks of alcohol   Drug use: Not Currently   Sexual activity: Not on file  Other Topics Concern    Not on file  Social History Narrative   Married.   Works at [employer] in [place].   Has one child.   Enjoys playing computer games, watching TV.   Social Determinants of Health   Financial Resource Strain: Not on file  Food Insecurity: Not on file  Transportation Needs: Not on file  Physical Activity: Not on file  Stress: Not on file  Social Connections: Not on file  Intimate Partner Violence: Not on file    Past Surgical History:  Procedure Laterality Date   BREAST BIOPSY Left 03/09/2016    hyalinized fibroadenoma    COLONOSCOPY  10/30/2019   DENTAL SURGERY      Family History  Adopted: Yes    No Known Allergies  Current Outpatient Medications on File Prior to Visit  Medication Sig Dispense Refill   amLODipine (NORVASC) 10 MG tablet Take 1 tablet (10 mg total) by mouth daily. for high blood pressure 90 tablet 3   atorvastatin (LIPITOR) 40 MG tablet Take 1 tablet (40 mg total) by mouth daily. for cholesterol. Office visit required for further refills. 90 tablet 0   hydrochlorothiazide (HYDRODIURIL) 25 MG tablet Take 1 tablet by mouth once daily for blood pressure 90 tablet 3   olmesartan (BENICAR) 40 MG tablet Take 1 tablet (40 mg total) by mouth daily. For blood pressure 90 tablet 0   omeprazole (PRILOSEC) 40 MG capsule TAKE  1 CAPSULE BY MOUTH DAILY FOR HEARTBURN 90 capsule 0   propranolol ER (INDERAL LA) 80 MG 24 hr capsule Take 1 capsule (80 mg total) by mouth at bedtime. For headache prevention. 90 capsule 0   venlafaxine XR (EFFEXOR-XR) 75 MG 24 hr capsule Take 1 capsule (75 mg total) by mouth daily with breakfast. For anxiety and hot flashes. 90 capsule 3   No current facility-administered medications on file prior to visit.    BP 138/86   Pulse (!) 102   Temp 98.8 F (37.1 C) (Temporal)   Ht '5\' 6"'$  (1.676 m)   Wt 158 lb (71.7 kg)   SpO2 98%   BMI 25.50 kg/m  Objective:   Physical Exam Constitutional:      General: She is not in acute distress.     Comments: Patient leaning towards left side while sitting  Cardiovascular:     Rate and Rhythm: Normal rate and regular rhythm.  Pulmonary:     Effort: Pulmonary effort is normal.     Breath sounds: Normal breath sounds.  Abdominal:     Tenderness: There is abdominal tenderness in the right lower quadrant. There is no guarding.    Musculoskeletal:     Cervical back: Neck supple.  Skin:    General: Skin is warm and dry.           Assessment & Plan:   Problem List Items Addressed This Visit       Other   RLQ abdominal pain    Exam concerning given tenderness. Need to rule out appendicitis. She does appear slightly uncomfortable as she is leaning onto her left side and slightly back while seated.  Other differentials include ovarian cyst, constipation. No evidence of diverticulosis on colonoscopy from 2021.  UA today negative.  Recommended stat CT abdomen/pelvis for further evaluation, she declines despite recommendations.  She agrees for blood work. Stat CBC with diff and BMP ordered and pending. If labs are abnormal, then would push again for CT scan.   Consider increasing Miralax to BID. Increase water consumption.  Strict ED precautions provided.       Relevant Orders   POCT Urinalysis Dipstick (Automated) (Completed)   CBC with Differential/Platelet   Basic metabolic panel       Pleas Koch, NP

## 2022-04-27 NOTE — Assessment & Plan Note (Addendum)
Exam concerning given tenderness. Need to rule out appendicitis. She does appear slightly uncomfortable as she is leaning onto her left side and slightly back while seated.  Other differentials include ovarian cyst, constipation. No evidence of diverticulosis on colonoscopy from 2021.  UA today negative.  Recommended stat CT abdomen/pelvis for further evaluation, she declines despite recommendations.  She agrees for blood work. Stat CBC with diff and BMP ordered and pending. If labs are abnormal, then would push again for CT scan.   Consider increasing Miralax to BID. Increase water consumption.  Strict ED precautions provided.

## 2022-05-04 ENCOUNTER — Encounter: Payer: Self-pay | Admitting: Primary Care

## 2022-05-04 ENCOUNTER — Ambulatory Visit (INDEPENDENT_AMBULATORY_CARE_PROVIDER_SITE_OTHER): Payer: Self-pay | Admitting: Primary Care

## 2022-05-04 DIAGNOSIS — R1031 Right lower quadrant pain: Secondary | ICD-10-CM

## 2022-05-04 NOTE — Addendum Note (Signed)
Addended by: Pat Kocher on: 05/04/2022 02:58 PM   Modules accepted: Orders

## 2022-05-04 NOTE — Patient Instructions (Addendum)
We will be in touch once we receive your vaginal swabs.  It was a pleasure to see you today!

## 2022-05-04 NOTE — Assessment & Plan Note (Addendum)
Unclear etiology.  Could be constipation vs MSK. CT abdomen/pelvis without cause for patient symptoms.  Wet prep collected today and pending.  She is stable during exam today.  She does continue to notice constipation so we will have her continue Miralax BID, add Colace 1-2 times daily.  Increase water intake.  Return precautions/ED precautions provided.

## 2022-05-04 NOTE — Progress Notes (Signed)
Subjective:    Patient ID: Maureen Orozco, female    DOB: Nov 04, 1965, 56 y.o.   MRN: [id number]  HPI  Maureen Orozco is a very pleasant 56 y.o. female with a history of hot flashes, hypertension, GERD, chronic back pain, tobacco abuse, prediabetes, hyperlipidemia who presents today to discuss abdominal pain.  She was last evaluated on 04/27/2022 for right lower quadrant abdominal pain/right upper pelvis pain that began 2 to 3 weeks prior.  She underwent CBC with differential and BMP which were negative.  Urinalysis was negative.  Given her ongoing symptoms we proceeded with CT abdomen pelvis which was negative for acute appendicitis, intestinal obstruction, or hydronephrosis.  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  Since her visit she continues to experience right lower quadrant abdominal pain which is constant, improved with laying down. She denies nausea, vomiting, fevers. She has increased Miralax to twice daily and is still having bowel movements twice weekly which is about the same as before. She is passing gas. She's tried Colace with some improvement.   Review of Systems  Constitutional:  Negative for fever.  Respiratory:  Negative for shortness of breath.   Gastrointestinal:  Positive for abdominal pain and constipation. Negative for diarrhea and nausea.  Genitourinary:  Negative for dysuria, flank pain, pelvic pain and vaginal discharge.         Past Medical History:  Diagnosis Date   Acute viral sinusitis 03/10/2020   Essential hypertension    Hyperlipidemia     Social History   Socioeconomic History   Marital status: Married    Spouse name: Not on file   Number of children: Not on file   Years of education: Not on file   Highest education level: Not on file  Occupational History   Not on file  Tobacco Use   Smoking status: Every Day    Packs/day: 0.25    Types: Cigarettes   Smokeless  tobacco: Never  Vaping Use   Vaping Use: Some days  Substance and Sexual Activity   Alcohol use: No    Alcohol/week: 0.0 standard drinks of alcohol   Drug use: Not Currently   Sexual activity: Not on file  Other Topics Concern   Not on file  Social History Narrative   Married.   Works at [employer] in [place].   Has one child.   Enjoys playing computer games, watching TV.   Social Determinants of Health   Financial Resource Strain: Not on file  Food Insecurity: Not on file  Transportation Needs: Not on file  Physical Activity: Not on file  Stress: Not on file  Social Connections: Not on file  Intimate Partner Violence: Not on file    Past Surgical History:  Procedure Laterality Date   BREAST BIOPSY Left 03/09/2016    hyalinized fibroadenoma    COLONOSCOPY  10/30/2019   DENTAL SURGERY      Family History  Adopted: Yes    No Known Allergies  Current Outpatient Medications on File Prior to Visit  Medication Sig Dispense Refill   amLODipine (NORVASC) 10 MG tablet Take 1 tablet (10 mg total) by mouth daily. for high blood pressure 90 tablet 3   atorvastatin (LIPITOR) 40 MG tablet Take 1 tablet (40 mg total) by mouth daily. for cholesterol. Office visit required for further refills. 90 tablet 0   hydrochlorothiazide (HYDRODIURIL) 25 MG tablet Take 1 tablet by mouth once daily for  blood pressure 90 tablet 3   olmesartan (BENICAR) 40 MG tablet Take 1 tablet (40 mg total) by mouth daily. For blood pressure 90 tablet 0   omeprazole (PRILOSEC) 40 MG capsule TAKE 1 CAPSULE BY MOUTH DAILY FOR HEARTBURN 90 capsule 0   propranolol ER (INDERAL LA) 80 MG 24 hr capsule Take 1 capsule (80 mg total) by mouth at bedtime. For headache prevention. 90 capsule 0   venlafaxine XR (EFFEXOR-XR) 75 MG 24 hr capsule Take 1 capsule (75 mg total) by mouth daily with breakfast. For anxiety and hot flashes. 90 capsule 3   No current facility-administered medications on file prior to visit.     BP 136/82   Pulse 100   Temp (!) 97.5 F (36.4 C) (Temporal)   Ht '5\' 6"'$  (1.676 m)   Wt 156 lb (70.8 kg)   SpO2 98%   BMI 25.18 kg/m  Objective:   Physical Exam Exam conducted with a chaperone present.  Constitutional:      General: She is not in acute distress.    Appearance: She is not ill-appearing.  Cardiovascular:     Rate and Rhythm: Normal rate.  Pulmonary:     Effort: Pulmonary effort is normal.  Abdominal:     General: Bowel sounds are normal.     Tenderness: There is abdominal tenderness in the right lower quadrant. There is no guarding.  Genitourinary:    Labia:        Right: No tenderness or lesion.        Left: No tenderness or lesion.      Vagina: Normal.     Cervix: Normal.     Uterus: Normal.            Assessment & Plan:   Problem List Items Addressed This Visit       Other   RLQ abdominal pain    Unclear etiology.  Could be constipation vs MSK. CT abdomen/pelvis without cause for patient symptoms.  Wet prep collected today and pending.  She is stable during exam today.  She does continue to notice constipation so we will have her continue Miralax BID, add Colace 1-2 times daily.  Increase water intake.  Return precautions/ED precautions provided.           Pleas Koch, NP

## 2022-05-07 LAB — WET PREP BY MOLECULAR PROBE
Candida species: NOT DETECTED
Gardnerella vaginalis: NOT DETECTED
MICRO NUMBER:: 14079904
SPECIMEN QUALITY:: ADEQUATE
Trichomonas vaginosis: NOT DETECTED

## 2022-06-12 DIAGNOSIS — K219 Gastro-esophageal reflux disease without esophagitis: Secondary | ICD-10-CM

## 2022-06-12 MED ORDER — OMEPRAZOLE 40 MG PO CPDR: DELAYED_RELEASE_CAPSULE | ORAL | 2 refills | Status: DC

## 2022-06-15 ENCOUNTER — Encounter: Payer: Self-pay | Admitting: Primary Care

## 2022-06-15 ENCOUNTER — Ambulatory Visit (INDEPENDENT_AMBULATORY_CARE_PROVIDER_SITE_OTHER): Payer: Self-pay | Admitting: Primary Care

## 2022-06-15 VITALS — BP 136/88 | HR 88 | Temp 97.8°F | Ht 66.0 in | Wt 159.0 lb

## 2022-06-15 DIAGNOSIS — M5441 Lumbago with sciatica, right side: Secondary | ICD-10-CM | POA: Insufficient documentation

## 2022-06-15 MED ORDER — PREDNISONE 20 MG PO TABS: ORAL_TABLET | ORAL | 0 refills | Status: DC

## 2022-06-15 NOTE — Patient Instructions (Signed)
Start prednisone for your back pain.  Take three tablets my mouth once daily in the morning for three days, then two tablets for three days, then one tablet for three days.  Stretch your back several times daily.  Try walking everyday.  It was a pleasure to see you today!

## 2022-06-15 NOTE — Assessment & Plan Note (Signed)
Reviewed CT scan from October 2023 with patient. Symptoms likely secondary to spondylosis and spondylolisthesis.  No alarm signs.  Treat with prednisone taper over the next 9 days. Discussed to start stretching and walking.  Consider PT vs neurosurgery.  She will update.

## 2022-06-15 NOTE — Progress Notes (Signed)
Subjective:    Patient ID: Maureen Orozco, female    DOB: 08/15/1965, 56 y.o.   MRN: [id number]  HPI  Maureen Orozco is a very pleasant 56 y.o. female with a history of hypertension, left sided low back pain, caregiver stress, frequent headaches who presents today to discuss back pain.  Her pain is located to the right lower back which began about 3 weeks ago. Her pain will radiate from the right back down to the right lateral ankle. She's also noticed tingling to the right lateral leg. Her pain is worse with standing or when first waking up in the morning to stand.   She denies injury/trauma, loss of bowel/bladder control.   She's tried Tylenol and Aleve without improvement.   She underwent CT abdomen/pelvis in October 2023 which revealed spondylosis in L5 vertebra with spondylylolisthesis at the L5-S1 level. It was also noted that she had encroachment of neural foramina at L4-L5 and L5-S1 levels.    Review of Systems  Genitourinary:        No loss of bowel/bladder control  Musculoskeletal:  Positive for back pain.  Neurological:  Positive for numbness. Negative for weakness.         Past Medical History:  Diagnosis Date   Acute viral sinusitis 03/10/2020   Essential hypertension    Hyperlipidemia     Social History   Socioeconomic History   Marital status: Married    Spouse name: Not on file   Number of children: Not on file   Years of education: Not on file   Highest education level: Not on file  Occupational History   Not on file  Tobacco Use   Smoking status: Every Day    Packs/day: 0.25    Types: Cigarettes    Passive exposure: Never   Smokeless tobacco: Never  Vaping Use   Vaping Use: Some days  Substance and Sexual Activity   Alcohol use: No    Alcohol/week: 0.0 standard drinks of alcohol   Drug use: Not Currently   Sexual activity: Not on file  Other Topics Concern   Not on file  Social History Narrative   Married.   Works at [employer] in  [place].   Has one child.   Enjoys playing computer games, watching TV.   Social Determinants of Health   Financial Resource Strain: Not on file  Food Insecurity: Not on file  Transportation Needs: Not on file  Physical Activity: Not on file  Stress: Not on file  Social Connections: Not on file  Intimate Partner Violence: Not on file    Past Surgical History:  Procedure Laterality Date   BREAST BIOPSY Left 03/09/2016    hyalinized fibroadenoma    COLONOSCOPY  10/30/2019   DENTAL SURGERY      Family History  Adopted: Yes    No Known Allergies  Current Outpatient Medications on File Prior to Visit  Medication Sig Dispense Refill   amLODipine (NORVASC) 10 MG tablet Take 1 tablet (10 mg total) by mouth daily. for high blood pressure 90 tablet 3   atorvastatin (LIPITOR) 40 MG tablet Take 1 tablet (40 mg total) by mouth daily. for cholesterol. Office visit required for further refills. 90 tablet 0   hydrochlorothiazide (HYDRODIURIL) 25 MG tablet Take 1 tablet by mouth once daily for blood pressure 90 tablet 3   olmesartan (BENICAR) 40 MG tablet Take 1 tablet (40 mg total) by mouth daily. For blood pressure 90 tablet 0   omeprazole (  PRILOSEC) 40 MG capsule TAKE 1 CAPSULE BY MOUTH DAILY FOR HEARTBURN 90 capsule 2   propranolol ER (INDERAL LA) 80 MG 24 hr capsule Take 1 capsule (80 mg total) by mouth at bedtime. For headache prevention. 90 capsule 0   venlafaxine XR (EFFEXOR-XR) 75 MG 24 hr capsule Take 1 capsule (75 mg total) by mouth daily with breakfast. For anxiety and hot flashes. 90 capsule 3   No current facility-administered medications on file prior to visit.    BP 136/88 (BP Location: Left Arm, Patient Position: Sitting, Cuff Size: Normal)   Pulse 88   Temp 97.8 F (36.6 C)   Ht '5\' 6"'$  (1.676 m)   Wt 159 lb (72.1 kg)   SpO2 100%   BMI 25.66 kg/m  Objective:   Physical Exam Musculoskeletal:     Lumbar back: No bony tenderness. Decreased range of motion.  Negative right straight leg raise test and negative left straight leg raise test.       Back:     Comments: Sitting and leaning onto her left side while in the exam room.           Assessment & Plan:   Problem List Items Addressed This Visit       Nervous and Auditory   Acute right-sided low back pain with right-sided sciatica - Primary    Reviewed CT scan from October 2023 with patient. Symptoms likely secondary to spondylosis and spondylolisthesis.  No alarm signs.  Treat with prednisone taper over the next 9 days. Discussed to start stretching and walking.  Consider PT vs neurosurgery.  She will update.      Relevant Medications   predniSONE (DELTASONE) 20 MG tablet       Pleas Koch, NP

## 2022-06-26 ENCOUNTER — Other Ambulatory Visit: Payer: Self-pay | Admitting: Primary Care

## 2022-06-26 DIAGNOSIS — E785 Hyperlipidemia, unspecified: Secondary | ICD-10-CM

## 2022-09-03 DIAGNOSIS — I10 Essential (primary) hypertension: Secondary | ICD-10-CM

## 2022-09-03 DIAGNOSIS — R519 Headache, unspecified: Secondary | ICD-10-CM

## 2022-09-03 MED ORDER — AMLODIPINE BESYLATE 10 MG PO TABS: 10.0000 mg | ORAL_TABLET | Freq: Every day | ORAL | 1 refills | Status: DC

## 2022-09-03 MED ORDER — HYDROCHLOROTHIAZIDE 25 MG PO TABS: 25.0000 mg | ORAL_TABLET | Freq: Every day | ORAL | 1 refills | Status: DC

## 2022-09-03 MED ORDER — PROPRANOLOL HCL ER 80 MG PO CP24: 80.0000 mg | ORAL_CAPSULE | Freq: Every day | ORAL | 1 refills | Status: DC

## 2022-09-04 ENCOUNTER — Other Ambulatory Visit: Payer: Self-pay | Admitting: Primary Care

## 2022-09-04 DIAGNOSIS — E785 Hyperlipidemia, unspecified: Secondary | ICD-10-CM

## 2022-09-04 DIAGNOSIS — I10 Essential (primary) hypertension: Secondary | ICD-10-CM

## 2022-09-04 DIAGNOSIS — M5441 Lumbago with sciatica, right side: Secondary | ICD-10-CM

## 2022-09-04 MED ORDER — OLMESARTAN MEDOXOMIL 40 MG PO TABS: 40.0000 mg | ORAL_TABLET | Freq: Every day | ORAL | 1 refills | Status: DC

## 2022-09-04 NOTE — Telephone Encounter (Signed)
From: Leron Croak Hoar To: Office of Pleas Koch, NP Sent: 09/03/2022 4:47 PM EST Subject: Medication Renewal Request  Refills have been requested for the following medications:   olmesartan (BENICAR) 40 MG tablet [Byrant Valent K Reesa Gotschall]   predniSONE (DELTASONE) 20 MG tablet [Enaya Howze K Indiana Gamero]   atorvastatin (LIPITOR) 40 MG tablet [Caison Hearn K Vale Peraza]  Preferred pharmacy: [facility], [place] - [id number] Delivery method: [employer]

## 2022-12-07 ENCOUNTER — Encounter: Payer: Self-pay | Admitting: Primary Care

## 2022-12-07 ENCOUNTER — Ambulatory Visit: Payer: Self-pay | Admitting: Primary Care

## 2022-12-07 ENCOUNTER — Other Ambulatory Visit: Payer: Self-pay | Admitting: Primary Care

## 2022-12-07 VITALS — BP 154/84 | HR 114 | Temp 98.1°F | Ht 66.0 in | Wt 153.0 lb

## 2022-12-07 DIAGNOSIS — B351 Tinea unguium: Secondary | ICD-10-CM

## 2022-12-07 DIAGNOSIS — E785 Hyperlipidemia, unspecified: Secondary | ICD-10-CM

## 2022-12-07 DIAGNOSIS — M5442 Lumbago with sciatica, left side: Secondary | ICD-10-CM

## 2022-12-07 MED ORDER — CYCLOBENZAPRINE HCL 5 MG PO TABS
5.0000 mg | ORAL_TABLET | Freq: Three times a day (TID) | ORAL | 0 refills | Status: DC | PRN
Start: 2022-12-07 — End: 2023-01-16

## 2022-12-07 MED ORDER — PREDNISONE 20 MG PO TABS
ORAL_TABLET | ORAL | 0 refills | Status: DC
Start: 2022-12-07 — End: 2023-01-03

## 2022-12-07 NOTE — Progress Notes (Signed)
Subjective:    Patient ID: Maureen Orozco, female    DOB: 08/24/1965, 57 y.o.   MRN: [id number]  Leg Pain  Associated symptoms include numbness.    Maureen Orozco is a very pleasant 57 y.o. female patient with a history of hypertension, lower back pain, tobacco use, prediabetes, caregiver stress who presents today to discuss lower extremity pain.  Symptom onset about 1 week ago with pain to the left lumbar back with radiation down the left lower extremity to her foot. Also with numbness to her left lower extremity.  She recently changed jobs and is more sedentary.  She is standing and walking for about 8 hours at a time.   She's tried Tylenol, Aleve, ibuprofen without improvement. She denies loss of bowel/bladder control, injury.  She is needing an excuse note for work.  She is also requesting referral to podiatry for removal of her left great toenail.  BP Readings from Last 3 Encounters:  12/07/22 (!) 154/84  06/15/22 136/88  05/04/22 136/82        Review of Systems  Musculoskeletal:  Positive for arthralgias and back pain.  Skin:  Negative for color change.  Neurological:  Positive for numbness. Negative for weakness.         Past Medical History:  Diagnosis Date   Acute viral sinusitis 03/10/2020   Essential hypertension    Hyperlipidemia     Social History   Socioeconomic History   Marital status: Married    Spouse name: Not on file   Number of children: Not on file   Years of education: Not on file   Highest education level: Not on file  Occupational History   Not on file  Tobacco Use   Smoking status: Every Day    Packs/day: .25    Types: Cigarettes    Passive exposure: Never   Smokeless tobacco: Never  Vaping Use   Vaping Use: Some days  Substance and Sexual Activity   Alcohol use: No    Alcohol/week: 0.0 standard drinks of alcohol   Drug use: Not Currently   Sexual activity: Not on file  Other Topics Concern   Not on file  Social History  Narrative   Married.   Works at [employer] in [place].   Has one child.   Enjoys playing computer games, watching TV.   Social Determinants of Health   Financial Resource Strain: Not on file  Food Insecurity: Not on file  Transportation Needs: Not on file  Physical Activity: Not on file  Stress: Not on file  Social Connections: Not on file  Intimate Partner Violence: Not on file    Past Surgical History:  Procedure Laterality Date   BREAST BIOPSY Left 03/09/2016    hyalinized fibroadenoma    COLONOSCOPY  10/30/2019   DENTAL SURGERY      Family History  Adopted: Yes    No Known Allergies  Current Outpatient Medications on File Prior to Visit  Medication Sig Dispense Refill   amLODipine (NORVASC) 10 MG tablet Take 1 tablet (10 mg total) by mouth daily. for high blood pressure 90 tablet 1   atorvastatin (LIPITOR) 40 MG tablet TAKE 1 TABLET BY MOUTH EVERY DAY FOR CHOLESTEROL 90 tablet 0   hydrochlorothiazide (HYDRODIURIL) 25 MG tablet Take 1 tablet (25 mg total) by mouth daily. for blood pressure 90 tablet 1   olmesartan (BENICAR) 40 MG tablet Take 1 tablet (40 mg total) by mouth daily. For blood pressure 90 tablet 1  omeprazole (PRILOSEC) 40 MG capsule TAKE 1 CAPSULE BY MOUTH DAILY FOR HEARTBURN 90 capsule 2   propranolol ER (INDERAL LA) 80 MG 24 hr capsule Take 1 capsule (80 mg total) by mouth at bedtime. For headache prevention. 90 capsule 1   venlafaxine XR (EFFEXOR-XR) 75 MG 24 hr capsule Take 1 capsule (75 mg total) by mouth daily with breakfast. For anxiety and hot flashes. 90 capsule 3   No current facility-administered medications on file prior to visit.    BP (!) 154/84   Pulse (!) 114   Temp 98.1 F (36.7 C) (Temporal)   Ht 5\' 6"  (1.676 m)   Wt 153 lb (69.4 kg)   SpO2 99%   BMI 24.69 kg/m  Objective:   Physical Exam Constitutional:      General: She is not in acute distress. Cardiovascular:     Rate and Rhythm: Normal rate and regular rhythm.   Musculoskeletal:     Lumbar back: No tenderness. Decreased range of motion. Negative right straight leg raise test and negative left straight leg raise test.       Back:     Comments: Decrease in range of motion due to pain with flexion of the lumbar region.  Skin:    General: Skin is warm and dry.     Comments: Onychomycosis to left great toenail           Assessment & Plan:  Acute left-sided low back pain with left-sided sciatica Assessment & Plan: Acute on chronic flare. No alarm signs on exam.  Start prednisone tablets. Take two tablets my mouth once daily in the morning for four days, then one tablet once daily in the morning for four days.   Start Flexeril 5 mg tablets 3 times daily as needed.  Drowsiness precautions provided.  Discussed stretching, walking.  Return precautions provided.  Orders: -     predniSONE; Take two tablets my mouth once daily in the morning for four days, then one tablet once daily in the morning for four days.  Dispense: 12 tablet; Refill: 0 -     Cyclobenzaprine HCl; Take 1 tablet (5 mg total) by mouth 3 (three) times daily as needed for muscle spasms.  Dispense: 15 tablet; Refill: 0  Onychomycosis Assessment & Plan: Referral placed to podiatry.  Orders: -     Ambulatory referral to Podiatry        Doreene Nest, NP

## 2022-12-07 NOTE — Patient Instructions (Signed)
Start prednisone tablets. Take two tablets my mouth once daily in the morning for four days, then one tablet once daily in the morning for four days.   You may take cyclobenzaprine 5 mg muscle relaxer medication.  Take 1 tablet by mouth 3 times daily as needed.  This may cause drowsiness.  Try to walk and stretch your back is much as possible.  It was a pleasure to see you today!

## 2022-12-07 NOTE — Assessment & Plan Note (Signed)
Referral placed to podiatry.

## 2022-12-07 NOTE — Assessment & Plan Note (Signed)
Acute on chronic flare. No alarm signs on exam.  Start prednisone tablets. Take two tablets my mouth once daily in the morning for four days, then one tablet once daily in the morning for four days.   Start Flexeril 5 mg tablets 3 times daily as needed.  Drowsiness precautions provided.  Discussed stretching, walking.  Return precautions provided.

## 2023-01-03 ENCOUNTER — Encounter: Payer: Self-pay | Admitting: Primary Care

## 2023-01-03 ENCOUNTER — Ambulatory Visit: Payer: Commercial Managed Care - HMO | Admitting: Primary Care

## 2023-01-03 ENCOUNTER — Encounter: Payer: Self-pay | Admitting: *Deleted

## 2023-01-03 VITALS — BP 142/80 | HR 100 | Temp 97.5°F | Ht 66.0 in | Wt 148.0 lb

## 2023-01-03 DIAGNOSIS — H938X1 Other specified disorders of right ear: Secondary | ICD-10-CM | POA: Insufficient documentation

## 2023-01-03 DIAGNOSIS — M5442 Lumbago with sciatica, left side: Secondary | ICD-10-CM | POA: Diagnosis not present

## 2023-01-03 DIAGNOSIS — G8929 Other chronic pain: Secondary | ICD-10-CM

## 2023-01-03 HISTORY — DX: Other specified disorders of right ear: H93.8X1

## 2023-01-03 MED ORDER — PREDNISONE 20 MG PO TABS
ORAL_TABLET | ORAL | 0 refills | Status: DC
Start: 2023-01-03 — End: 2023-01-16

## 2023-01-03 MED ORDER — FLUTICASONE PROPIONATE 50 MCG/ACT NA SUSP
1.0000 | Freq: Two times a day (BID) | NASAL | 0 refills | Status: DC
Start: 2023-01-03 — End: 2023-06-26

## 2023-01-03 NOTE — Assessment & Plan Note (Signed)
Likely allergy related given evidence of fluid buildup.  Start prednisone 20 mg course.Take 3 tablets my mouth once daily in the morning for 3 days, then 2 tablets for 3 days, then 1 tablet for 3 days. Prescription for Flonase nasal spray sent to pharmacy.  No evidence of infection or cerumen impaction. She will update if no improvement.

## 2023-01-03 NOTE — Progress Notes (Addendum)
Subjective:    Patient ID: Maureen Orozco, female    DOB: 21-Mar-1966, 57 y.o.   MRN: [id number]  Ear Fullness  Pertinent negatives include no sore throat.    Maureen Orozco is a very pleasant 57 y.o. female with a history of chronic back pain, hyperlipidemia, prediabetes, frequent headaches, hypertension who presents today to discuss ear fullness and back pain.  1) Ear Fullness: Symptom onset 2 weeks ago with ear fullness to the right ear.  She is also noticed a slight aching feeling.  She's tried cleaning her ear with warm water and Mucinex which has not helped. She denies fevers, congestion, post nasal drip.   She is compliant to her olmesartan 40 mg daily, hydrochlorothiazide 25 mg daily, amlodipine 10 mg daily.  BP Readings from Last 3 Encounters:  01/03/23 (!) 142/80  12/07/22 (!) 154/84  06/15/22 136/88   2) Back Pain: Chronic and intermittent on a daily basis. She's also experienced left lower extremity radiculopathy and numbness. She was treated with prednisone and cyclobenzaprine in late May 2024 for ongoing left lower back pain. The prednisone helped some. She takes the cyclobenzaprine at night with some improvement.   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   She has not seen orthopedics. She has insurance again.  Review of Systems  Constitutional:  Negative for fever.  HENT:  Positive for ear pain. Negative for congestion, postnasal drip and sore throat.        Ear fullness  Musculoskeletal:  Positive for arthralgias and back pain.  Neurological:  Positive for numbness.         Past Medical History:  Diagnosis Date   Acute viral sinusitis 03/10/2020   Essential hypertension    Hyperlipidemia     Social History   Socioeconomic History   Marital status: Married    Spouse name: Not on file   Number of children: Not on file   Years of education: Not on  file   Highest education level: 12th grade  Occupational History   Not on file  Tobacco Use   Smoking status: Every Day    Packs/day: .25    Types: Cigarettes    Passive exposure: Never   Smokeless tobacco: Never  Vaping Use   Vaping Use: Some days  Substance and Sexual Activity   Alcohol use: No    Alcohol/week: 0.0 standard drinks of alcohol   Drug use: Not Currently   Sexual activity: Not on file  Other Topics Concern   Not on file  Social History Narrative   Married.   Works at [employer] in [place].   Has one child.   Enjoys playing computer games, watching TV.   Social Determinants of Health   Financial Resource Strain: Low Risk  (01/02/2023)   Overall Financial Resource Strain (CARDIA)    Difficulty of Paying Living Expenses: Not hard at all  Food Insecurity: No Food Insecurity (01/02/2023)   Hunger Vital Sign    Worried About Running Out of Food in the Last Year: Never true    Ran Out of Food in the Last Year: Never true  Transportation Needs: No Transportation Needs (01/02/2023)   PRAPARE - Administrator, Civil Service (Medical): No    Lack of Transportation (Non-Medical): No  Physical Activity: Unknown (01/02/2023)   Exercise Vital Sign    Days of Exercise per Week: 0 days  Minutes of Exercise per Session: Not on file  Stress: Stress Concern Present (01/02/2023)   [employer] of Occupational Health - Occupational Stress Questionnaire    Feeling of Stress : To some extent  Social Connections: Moderately Isolated (01/02/2023)   Social Connection and Isolation Panel [NHANES]    Frequency of Communication with Friends and Family: More than three times a week    Frequency of Social Gatherings with Friends and Family: More than three times a week    Attends Religious Services: Never    Database administrator or Organizations: No    Attends Engineer, structural: Not on file    Marital Status: Married  Catering manager Violence: Not on  file    Past Surgical History:  Procedure Laterality Date   BREAST BIOPSY Left 03/09/2016    hyalinized fibroadenoma    COLONOSCOPY  10/30/2019   DENTAL SURGERY      Family History  Adopted: Yes    No Known Allergies  Current Outpatient Medications on File Prior to Visit  Medication Sig Dispense Refill   amLODipine (NORVASC) 10 MG tablet Take 1 tablet (10 mg total) by mouth daily. for high blood pressure 90 tablet 1   atorvastatin (LIPITOR) 40 MG tablet TAKE 1 TABLET BY MOUTH EVERY DAY FOR CHOLESTEROL 90 tablet 1   cyclobenzaprine (FLEXERIL) 5 MG tablet Take 1 tablet (5 mg total) by mouth 3 (three) times daily as needed for muscle spasms. 15 tablet 0   hydrochlorothiazide (HYDRODIURIL) 25 MG tablet Take 1 tablet (25 mg total) by mouth daily. for blood pressure 90 tablet 1   olmesartan (BENICAR) 40 MG tablet Take 1 tablet (40 mg total) by mouth daily. For blood pressure 90 tablet 1   omeprazole (PRILOSEC) 40 MG capsule TAKE 1 CAPSULE BY MOUTH DAILY FOR HEARTBURN 90 capsule 2   propranolol ER (INDERAL LA) 80 MG 24 hr capsule Take 1 capsule (80 mg total) by mouth at bedtime. For headache prevention. 90 capsule 1   venlafaxine XR (EFFEXOR-XR) 75 MG 24 hr capsule Take 1 capsule (75 mg total) by mouth daily with breakfast. For anxiety and hot flashes. 90 capsule 3   No current facility-administered medications on file prior to visit.    BP (!) 142/80   Pulse 100   Temp (!) 97.5 F (36.4 C) (Temporal)   Ht 5\' 6"  (1.676 m)   Wt 148 lb (67.1 kg)   SpO2 98%   BMI 23.89 kg/m  Objective:   Physical Exam HENT:     Right Ear: There is no impacted cerumen. Tympanic membrane is erythematous. Tympanic membrane is not retracted or bulging.     Left Ear: A middle ear effusion is present. There is no impacted cerumen. Tympanic membrane is not erythematous, retracted or bulging.  Cardiovascular:     Rate and Rhythm: Normal rate.  Pulmonary:     Effort: Pulmonary effort is normal.   Musculoskeletal:     Cervical back: Neck supple.  Skin:    General: Skin is warm and dry.           Assessment & Plan:  Sensation of fullness in right ear Assessment & Plan: Likely allergy related given evidence of fluid buildup.  Start prednisone 20 mg course.Take 3 tablets my mouth once daily in the morning for 3 days, then 2 tablets for 3 days, then 1 tablet for 3 days. Prescription for Flonase nasal spray sent to pharmacy.  No evidence of infection or cerumen impaction.  She will update if no improvement.  Orders: -     predniSONE; Take 3 tablets my mouth once daily in the morning for 3 days, then 2 tablets for 3 days, then 1 tablet for 3 days.  Dispense: 18 tablet; Refill: 0 -     Fluticasone Propionate; Place 1 spray into both nostrils 2 (two) times daily.  Dispense: 16 g; Refill: 0  Chronic left-sided low back pain with left-sided sciatica Assessment & Plan: Continued.  She now has insurance so we will refer her to neurosurgery for further evaluation.  Start prednisone 20 mg course.Take 3 tablets my mouth once daily in the morning for 3 days, then 2 tablets for 3 days, then 1 tablet for 3 days. Continue cyclobenzaprine 5 mg as needed.  Orders: -     predniSONE; Take 3 tablets my mouth once daily in the morning for 3 days, then 2 tablets for 3 days, then 1 tablet for 3 days.  Dispense: 18 tablet; Refill: 0 -     Ambulatory referral to Neurosurgery        Doreene Nest, NP

## 2023-01-03 NOTE — Patient Instructions (Signed)
Start prednisone 20 mg tablets for your back. Take 3 tablets my mouth once daily in the morning for 3 days, then 2 tablets for 3 days, then 1 tablet for 3 days.  You can use the muscle relaxer as needed.  You will either be contacted via phone regarding your referral to neurosurgery, or you may receive a letter on your MyChart portal from our referral team with instructions for scheduling an appointment. Please let us know if you have not been contacted by anyone within two weeks.  It was a pleasure to see you today!

## 2023-01-03 NOTE — Assessment & Plan Note (Signed)
Continued.  She now has insurance so we will refer her to neurosurgery for further evaluation.  Start prednisone 20 mg course.Take 3 tablets my mouth once daily in the morning for 3 days, then 2 tablets for 3 days, then 1 tablet for 3 days. Continue cyclobenzaprine 5 mg as needed.

## 2023-01-16 ENCOUNTER — Encounter: Payer: Self-pay | Admitting: Primary Care

## 2023-01-16 ENCOUNTER — Ambulatory Visit: Payer: Commercial Managed Care - HMO | Admitting: Primary Care

## 2023-01-16 VITALS — BP 134/78 | HR 100 | Temp 97.6°F | Ht 66.0 in | Wt 150.0 lb

## 2023-01-16 DIAGNOSIS — M5442 Lumbago with sciatica, left side: Secondary | ICD-10-CM

## 2023-01-16 DIAGNOSIS — H938X1 Other specified disorders of right ear: Secondary | ICD-10-CM | POA: Diagnosis not present

## 2023-01-16 MED ORDER — CYCLOBENZAPRINE HCL 5 MG PO TABS
5.0000 mg | ORAL_TABLET | Freq: Every evening | ORAL | 0 refills | Status: DC | PRN
Start: 2023-01-16 — End: 2023-05-22

## 2023-01-16 NOTE — Patient Instructions (Signed)
Continue using Flonase twice daily.  Start taking Zyrtec every night at bedtime.  Please keep me updated!

## 2023-01-16 NOTE — Assessment & Plan Note (Signed)
Effusions noted bilaterally for which is likely causing her symptoms. It is also possible she has an early viral respiratory infection.  Continue Flonase twice daily. Add Zyrtec 10 mg at bedtime.  She will update via MyChart.

## 2023-01-16 NOTE — Progress Notes (Signed)
Subjective:    Patient ID: Maureen Orozco, female    DOB: 23-Sep-1965, 56 y.o.   MRN: [id number]  HPI  Maureen Orozco is a very pleasant 57 y.o. female with history of heart condition, GERD, tobacco use, prediabetes, dental caries, frequent headaches who presents today to discuss otalgia.  She is also needing a refill of her muscle relaxer.  She was evaluated on 01/03/2023 for a 2-week history of right ear fullness and aching sensation.  Exam was consistent for effusion so she was treated with prednisone taper course x 9 days.  She was also advised to use Flonase thereafter.  Today she mentions that she initially felt better until 2 days ago when her right ear pain returned with fullness, bilateral frontal headaches, mild cough, and rhinorrhea.   She's been exposed to several family members who have been sick. She denies fevers, chills, shortness of breath. She is not taking an antihistamine.  She has not tested for COVID.  BP Readings from Last 3 Encounters:  01/16/23 134/78  01/03/23 (!) 142/80  12/07/22 (!) 154/84      Review of Systems  Constitutional:  Negative for chills and fever.  HENT:  Positive for ear pain. Negative for congestion, sinus pressure and sore throat.   Respiratory:  Positive for cough.   Neurological:  Positive for headaches.         Past Medical History:  Diagnosis Date   Acute viral sinusitis 03/10/2020   Essential hypertension    Hyperlipidemia     Social History   Socioeconomic History   Marital status: Married    Spouse name: Not on file   Number of children: Not on file   Years of education: Not on file   Highest education level: 12th grade  Occupational History   Not on file  Tobacco Use   Smoking status: Every Day    Packs/day: .25    Types: Cigarettes    Passive exposure: Never   Smokeless tobacco: Never  Vaping Use   Vaping Use: Some days  Substance and Sexual Activity   Alcohol use: No    Alcohol/week: 0.0 standard  drinks of alcohol   Drug use: Not Currently   Sexual activity: Not on file  Other Topics Concern   Not on file  Social History Narrative   Married.   Works at [employer] in [place].   Has one child.   Enjoys playing computer games, watching TV.   Social Determinants of Health   Financial Resource Strain: Low Risk  (01/02/2023)   Overall Financial Resource Strain (CARDIA)    Difficulty of Paying Living Expenses: Not hard at all  Food Insecurity: No Food Insecurity (01/02/2023)   Hunger Vital Sign    Worried About Running Out of Food in the Last Year: Never true    Ran Out of Food in the Last Year: Never true  Transportation Needs: No Transportation Needs (01/02/2023)   PRAPARE - Administrator, Civil Service (Medical): No    Lack of Transportation (Non-Medical): No  Physical Activity: Unknown (01/02/2023)   Exercise Vital Sign    Days of Exercise per Week: 0 days    Minutes of Exercise per Session: Not on file  Stress: Stress Concern Present (01/02/2023)   [employer] of Occupational Health - Occupational Stress Questionnaire    Feeling of Stress : To some extent  Social Connections: Moderately Isolated (01/02/2023)   Social Connection and Isolation Panel [NHANES]  Frequency of Communication with Friends and Family: More than three times a week    Frequency of Social Gatherings with Friends and Family: More than three times a week    Attends Religious Services: Never    Database administrator or Organizations: No    Attends Engineer, structural: Not on file    Marital Status: Married  Catering manager Violence: Not on file    Past Surgical History:  Procedure Laterality Date   BREAST BIOPSY Left 03/09/2016    hyalinized fibroadenoma    COLONOSCOPY  10/30/2019   DENTAL SURGERY      Family History  Adopted: Yes    No Known Allergies  Current Outpatient Medications on File Prior to Visit  Medication Sig Dispense Refill   amLODipine  (NORVASC) 10 MG tablet Take 1 tablet (10 mg total) by mouth daily. for high blood pressure 90 tablet 1   atorvastatin (LIPITOR) 40 MG tablet TAKE 1 TABLET BY MOUTH EVERY DAY FOR CHOLESTEROL 90 tablet 1   fluticasone (FLONASE) 50 MCG/ACT nasal spray Place 1 spray into both nostrils 2 (two) times daily. 16 g 0   hydrochlorothiazide (HYDRODIURIL) 25 MG tablet Take 1 tablet (25 mg total) by mouth daily. for blood pressure 90 tablet 1   olmesartan (BENICAR) 40 MG tablet Take 1 tablet (40 mg total) by mouth daily. For blood pressure 90 tablet 1   omeprazole (PRILOSEC) 40 MG capsule TAKE 1 CAPSULE BY MOUTH DAILY FOR HEARTBURN 90 capsule 2   propranolol ER (INDERAL LA) 80 MG 24 hr capsule Take 1 capsule (80 mg total) by mouth at bedtime. For headache prevention. 90 capsule 1   venlafaxine XR (EFFEXOR-XR) 75 MG 24 hr capsule Take 1 capsule (75 mg total) by mouth daily with breakfast. For anxiety and hot flashes. 90 capsule 3   No current facility-administered medications on file prior to visit.    BP 134/78   Pulse 100   Temp 97.6 F (36.4 C) (Temporal)   Ht 5\' 6"  (1.676 m)   Wt 150 lb (68 kg)   SpO2 98%   BMI 24.21 kg/m  Objective:   Physical Exam Constitutional:      Appearance: She is not ill-appearing.  HENT:     Right Ear: Ear canal normal. Tympanic membrane is not erythematous, retracted or bulging.     Left Ear: Ear canal normal. Tympanic membrane is not erythematous, retracted or bulging.     Ears:     Comments: Effusions noted bilaterally    Nose:     Right Sinus: No maxillary sinus tenderness or frontal sinus tenderness.     Left Sinus: No maxillary sinus tenderness or frontal sinus tenderness.     Mouth/Throat:     Mouth: Mucous membranes are moist.     Pharynx: No posterior oropharyngeal erythema.  Eyes:     Conjunctiva/sclera: Conjunctivae normal.  Cardiovascular:     Rate and Rhythm: Normal rate and regular rhythm.  Pulmonary:     Effort: Pulmonary effort is normal.      Breath sounds: Normal breath sounds. No wheezing or rales.  Musculoskeletal:     Cervical back: Neck supple.  Lymphadenopathy:     Cervical: No cervical adenopathy.  Skin:    General: Skin is warm and dry.           Assessment & Plan:  Sensation of fullness in right ear Assessment & Plan: Effusions noted bilaterally for which is likely causing her symptoms. It is  also possible she has an early viral respiratory infection.  Continue Flonase twice daily. Add Zyrtec 10 mg at bedtime.  She will update via MyChart.   Acute left-sided low back pain with left-sided sciatica -     Cyclobenzaprine HCl; Take 1 tablet (5 mg total) by mouth at bedtime as needed for muscle spasms.  Dispense: 30 tablet; Refill: 0        Doreene Nest, NP

## 2023-01-21 ENCOUNTER — Encounter: Payer: Self-pay | Admitting: Podiatry

## 2023-01-21 ENCOUNTER — Ambulatory Visit: Payer: Commercial Managed Care - HMO | Admitting: Podiatry

## 2023-01-21 DIAGNOSIS — L603 Nail dystrophy: Secondary | ICD-10-CM | POA: Diagnosis not present

## 2023-01-21 DIAGNOSIS — L600 Ingrowing nail: Secondary | ICD-10-CM

## 2023-01-21 MED ORDER — CEPHALEXIN 500 MG PO CAPS: 500.0000 mg | ORAL_CAPSULE | Freq: Three times a day (TID) | ORAL | 0 refills | Status: DC

## 2023-01-21 NOTE — Patient Instructions (Signed)

## 2023-01-28 ENCOUNTER — Encounter: Payer: Self-pay | Admitting: Podiatry

## 2023-01-28 MED ORDER — HYDROCODONE-ACETAMINOPHEN 5-325 MG PO TABS: ORAL_TABLET | ORAL | 0 refills | Status: DC

## 2023-01-28 NOTE — Progress Notes (Signed)
Subjective:   Patient ID: Maureen Orozco, female   DOB: 57 y.o.   MRN: [id number]   HPI Chief Complaint  Patient presents with   Toe Pain    Hallux left - toenail thick and discolored, removed the nail 2021 to see if it would grow back normally, but it didn't, would like it removed permanently    New Patient (Initial Visit)    Est pt [id number]   57 year old female presents the office for above concerns.  No swelling redness or drainage.  No other concerns.    Review of Systems  All other systems reviewed and are negative.  Past Medical History:  Diagnosis Date   Acute viral sinusitis 03/10/2020   Essential hypertension    Hyperlipidemia     Past Surgical History:  Procedure Laterality Date   BREAST BIOPSY Left 03/09/2016    hyalinized fibroadenoma    COLONOSCOPY  10/30/2019   DENTAL SURGERY       Current Outpatient Medications:    cephALEXin (KEFLEX) 500 MG capsule, Take 1 capsule (500 mg total) by mouth 3 (three) times daily., Disp: 21 capsule, Rfl: 0   amLODipine (NORVASC) 10 MG tablet, Take 1 tablet (10 mg total) by mouth daily. for high blood pressure, Disp: 90 tablet, Rfl: 1   atorvastatin (LIPITOR) 40 MG tablet, TAKE 1 TABLET BY MOUTH EVERY DAY FOR CHOLESTEROL, Disp: 90 tablet, Rfl: 1   cyclobenzaprine (FLEXERIL) 5 MG tablet, Take 1 tablet (5 mg total) by mouth at bedtime as needed for muscle spasms., Disp: 30 tablet, Rfl: 0   fluticasone (FLONASE) 50 MCG/ACT nasal spray, Place 1 spray into both nostrils 2 (two) times daily., Disp: 16 g, Rfl: 0   hydrochlorothiazide (HYDRODIURIL) 25 MG tablet, Take 1 tablet (25 mg total) by mouth daily. for blood pressure, Disp: 90 tablet, Rfl: 1   olmesartan (BENICAR) 40 MG tablet, Take 1 tablet (40 mg total) by mouth daily. For blood pressure, Disp: 90 tablet, Rfl: 1   omeprazole (PRILOSEC) 40 MG capsule, TAKE 1 CAPSULE BY MOUTH DAILY FOR HEARTBURN, Disp: 90 capsule, Rfl: 2   propranolol ER (INDERAL LA) 80 MG 24 hr capsule, Take 1  capsule (80 mg total) by mouth at bedtime. For headache prevention., Disp: 90 capsule, Rfl: 1   venlafaxine XR (EFFEXOR-XR) 75 MG 24 hr capsule, Take 1 capsule (75 mg total) by mouth daily with breakfast. For anxiety and hot flashes., Disp: 90 capsule, Rfl: 3  No Known Allergies        Objective:  Physical Exam  General: AAO x3, NAD  Dermatological: Left hallux nails hypertrophic, dystrophic with yellow, brown discoloration there is tenderness palpation of the nail there is incurvation of the nail borders.  No edema, erythema or any signs of infection.  No open lesions.  Vascular: Dorsalis Pedis artery and Posterior Tibial artery pedal pulses are 2/4 bilateral with immedate capillary fill time. There is no pain with calf compression, swelling, warmth, erythema.   Neruologic: Grossly intact via light touch bilateral.  Musculoskeletal: Tenderness of the hallux nail.  No other areas of discomfort.  Gait: Unassisted, Nonantalgic.       Assessment:   Left hallux ingrown toenail, onychodystrophy     Plan:  -Treatment options discussed including all alternatives, risks, and complications -Etiology of symptoms were discussed -At this time, the patient is requesting total nail removal with chemical matricectomy to the symptomatic portion of the nail. Risks and complications were discussed with the patient for which they understand and  written consent was obtained. Under sterile conditions a total of 3 mL of a mixture of 2% lidocaine plain and 0.5% Marcaine plain was infiltrated in a hallux block fashion. Once anesthetized, the skin was prepped in sterile fashion. A tourniquet was then applied. Next the left hallux toenail was then sharply excised making sure to remove the entire offending nail borders. Once the nails were ensured to be removed area was debrided and the underlying skin was intact. There is no purulence identified in the procedure. Next phenol was then applied under standard  conditions and copiously irrigated.  Silvadene was applied. A dry sterile dressing was applied. After application of the dressing the tourniquet was removed and there is found to be an immediate capillary refill time to the digit. The patient tolerated the procedure well any complications. Post procedure instructions were discussed the patient for which he verbally understood. Discussed signs/symptoms of infection and directed to call the office immediately should any occur or go directly to the emergency room. In the meantime, encouraged to call the office with any questions, concerns, changes symptoms. -Keflex  Vivi Barrack DPM

## 2023-01-28 NOTE — Telephone Encounter (Signed)
Patient messaged via MyChart, noting pain where her toenail was removed and had chemical matrixectomy.  Having trouble tolerating shoes.  No other symptoms noted to indicate infection.  Rx generic Vicodin sent to her pharmacy on file today.

## 2023-01-31 DIAGNOSIS — M5416 Radiculopathy, lumbar region: Secondary | ICD-10-CM | POA: Insufficient documentation

## 2023-01-31 DIAGNOSIS — M51369 Other intervertebral disc degeneration, lumbar region without mention of lumbar back pain or lower extremity pain: Secondary | ICD-10-CM | POA: Insufficient documentation

## 2023-02-04 ENCOUNTER — Ambulatory Visit (INDEPENDENT_AMBULATORY_CARE_PROVIDER_SITE_OTHER): Payer: Commercial Managed Care - HMO | Admitting: Podiatry

## 2023-02-04 DIAGNOSIS — L600 Ingrowing nail: Secondary | ICD-10-CM | POA: Diagnosis not present

## 2023-02-04 DIAGNOSIS — L603 Nail dystrophy: Secondary | ICD-10-CM | POA: Diagnosis not present

## 2023-02-10 NOTE — Progress Notes (Signed)
Subjective: Chief Complaint  Patient presents with   Ingrown Toenail    Pt came in for a toenail check up. Pt stated that the pain comes and go. She also stated that her right toe is crooked and she just noticed that there is no pain.   57 year old female presents the above concerns.  She states that the nail is crooked but she has no pain.  No swelling redness or drainage.  Objective: AAO x3, NAD DP/PT pulses palpable bilaterally, CRT less than 3 seconds Status post partial nail avulsion right hallux.  Minimal edema still remains there is no significant cellulitis.  There is no drainage or pus.  No fluctuation or congestion.  No pain.  Scab is formed.  There are some dystrophy present of the nail is hypertrophic as well. No pain with calf compression, swelling, warmth, erythema  Assessment: Status post partial nail avulsion  Plan: -All treatment options discussed with the patient including all alternatives, risks, complications.  -Overall appears to be healing well.  I would still clean with soap and water, dry thoroughly.  Keep the bandage on the area particularly when wearing shoes and socks.  -Can use urea nail gel once healed to help the nail dystrophy -Monitor for any signs or symptoms of infection or recurrence. -Patient encouraged to call the office with any questions, concerns, change in symptoms.   Vivi Barrack DPM

## 2023-03-04 ENCOUNTER — Ambulatory Visit: Payer: Commercial Managed Care - HMO | Admitting: Podiatry

## 2023-03-04 ENCOUNTER — Ambulatory Visit: Payer: Commercial Managed Care - HMO

## 2023-03-04 DIAGNOSIS — S92032A Displaced avulsion fracture of tuberosity of left calcaneus, initial encounter for closed fracture: Secondary | ICD-10-CM | POA: Diagnosis not present

## 2023-03-04 DIAGNOSIS — M21619 Bunion of unspecified foot: Secondary | ICD-10-CM

## 2023-03-04 MED ORDER — CEPHALEXIN 500 MG PO CAPS: 500.0000 mg | ORAL_CAPSULE | Freq: Three times a day (TID) | ORAL | 0 refills | Status: DC

## 2023-03-04 MED ORDER — IBUPROFEN 600 MG PO TABS: 600.0000 mg | ORAL_TABLET | Freq: Three times a day (TID) | ORAL | 0 refills | Status: DC | PRN

## 2023-03-04 NOTE — Progress Notes (Unsigned)
Subjective: Chief Complaint  Patient presents with   Fracture    Left foot     57 year old female presents after a with concerns of fracture to the left foot which occurred on August 1 after hitting her foot.  She was seen in urgent care.  She has been wearing the cam boot.  She was told she had an avulsion fracture.  She has had bruising to her toes.  No other injuries.  Objective: AAO x3, NAD-presents in cam boot DP/PT pulses palpable bilaterally, CRT less than 3 seconds Tender palpation of some of the lateral aspect of the foot along the calcaneocuboid joint, lateral portion.  There is localized edema there is no erythema.  There is no other areas pinpoint tenderness.  There are some bruising present to the digits.  No open lesions. No pain with calf compression, swelling, warmth, erythema  Assessment: Avulsion fracture left foot  Plan: -All treatment options discussed with the patient including all alternatives, risks, complications.  -X-rays obtained reviewed.  3 views of the foot were obtained.  Likely avulsion fraction of the calcaneocuboid joint. -Recommend mobilization still in the cam boot.  Ice, elevation, limited activity -Patient encouraged to call the office with any questions, concerns, change in symptoms.   Return in about 4 weeks (around 04/01/2023) for foot fracture, x-ray.  Vivi Barrack DPM

## 2023-03-05 ENCOUNTER — Other Ambulatory Visit: Payer: Self-pay | Admitting: Podiatry

## 2023-03-05 DIAGNOSIS — M21619 Bunion of unspecified foot: Secondary | ICD-10-CM

## 2023-03-05 IMAGING — MG MM DIGITAL SCREENING BILAT W/ TOMO AND CAD
6 of 10 series · 6 of 30 positions shown · non-contrast
Comparison: Previous exam(s).

CLINICAL DATA: Screening.

EXAM:
DIGITAL SCREENING BILATERAL MAMMOGRAM WITH TOMOSYNTHESIS AND CAD
TECHNIQUE: Bilateral screening digital craniocaudal and mediolateral oblique
mammograms were obtained. Bilateral screening digital breast
tomosynthesis was performed. The images were evaluated with
computer-aided detection.

[L CC synth-2D]
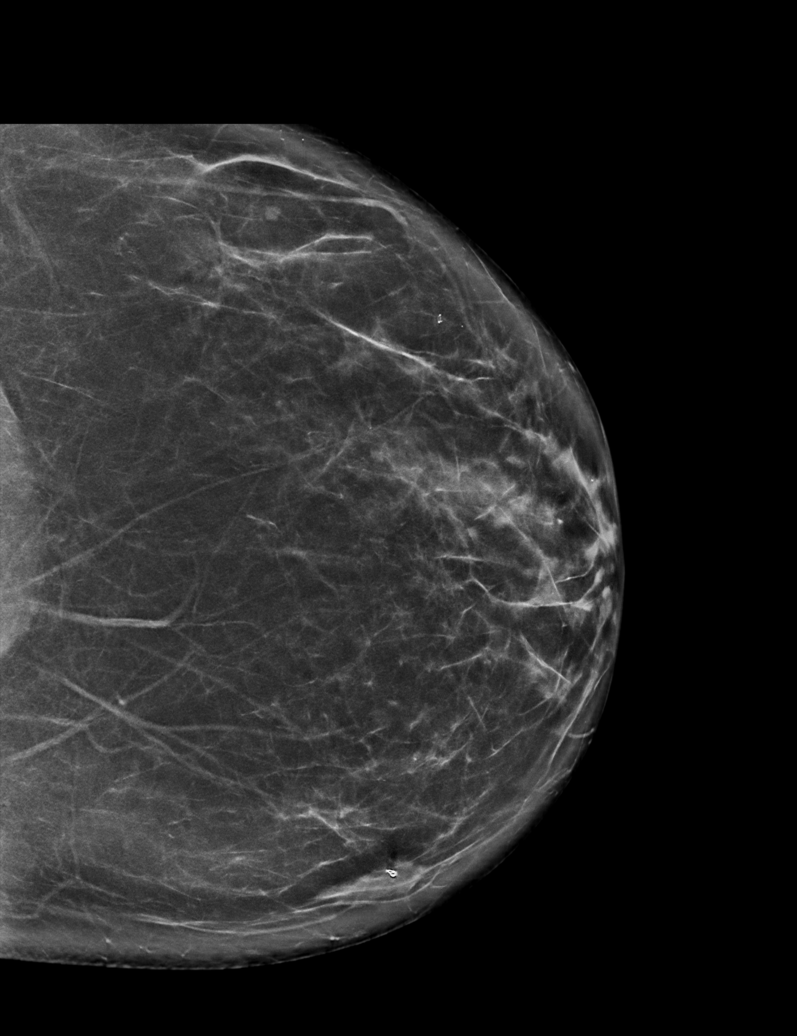

[R CC synth-2D (1 of 2)]
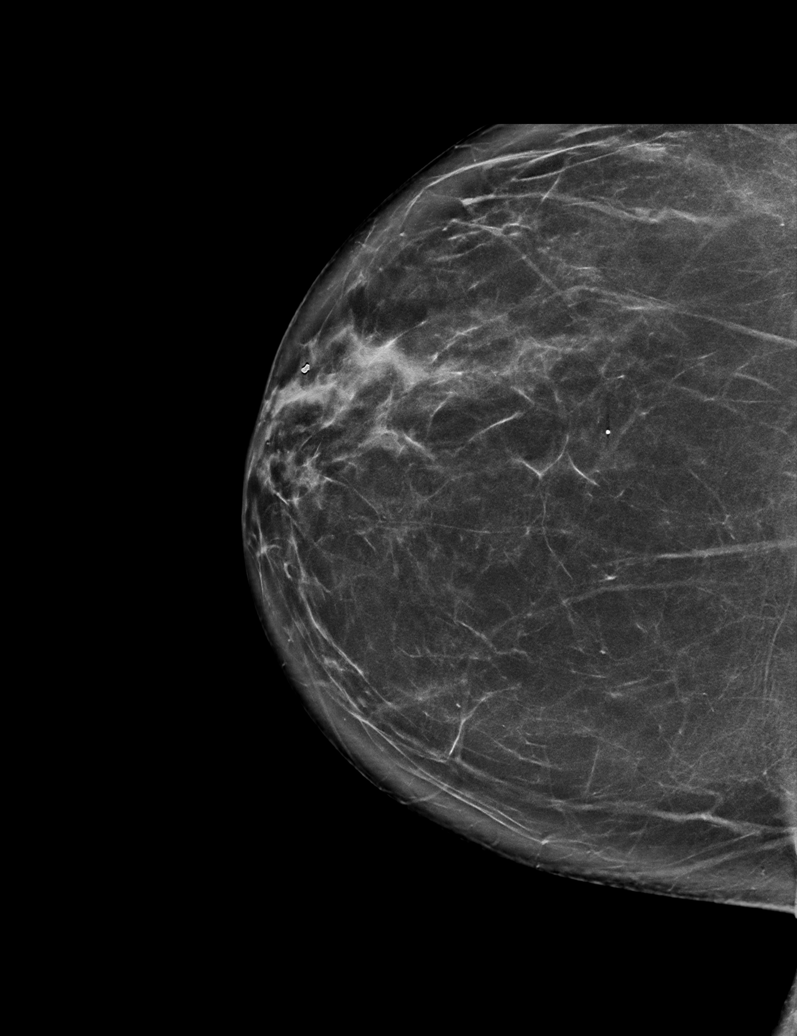

[R CC synth-2D (2 of 2)]
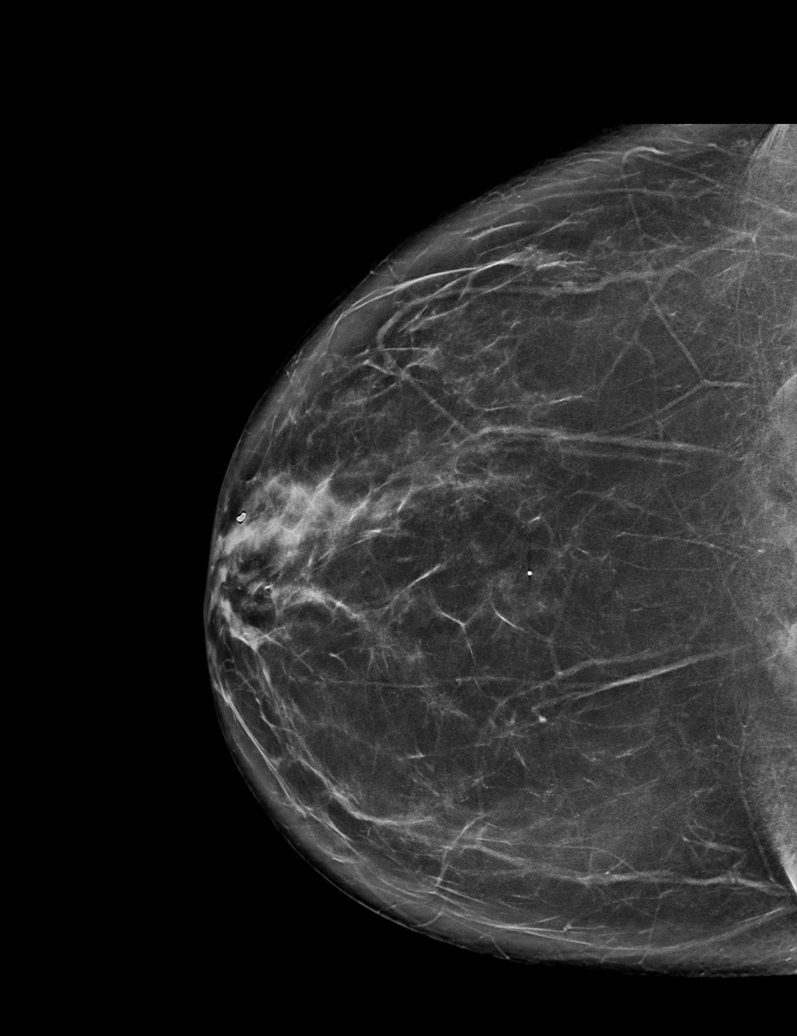

[L MLO synth-2D]
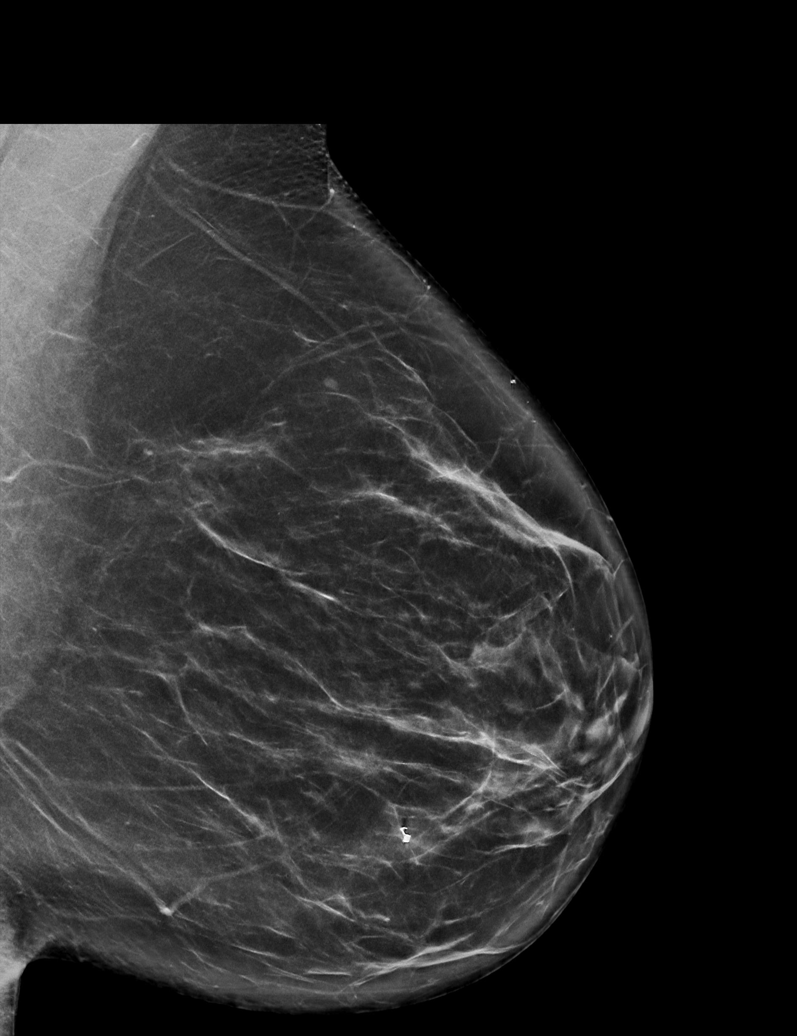

[R MLO synth-2D]
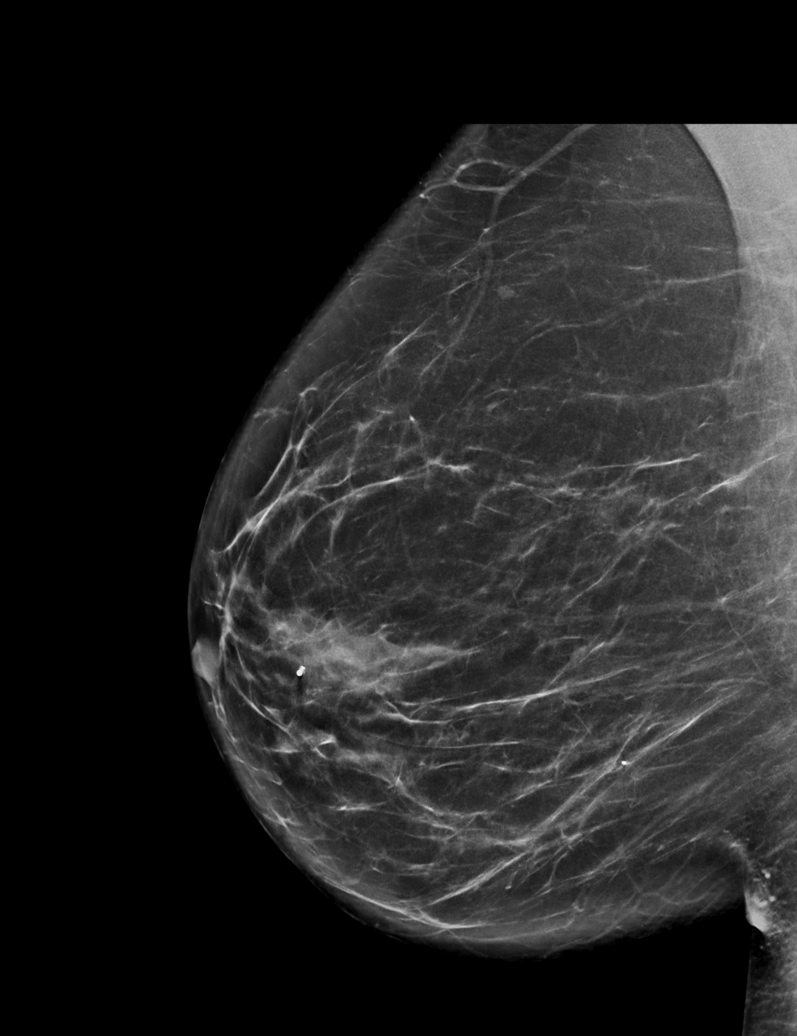

[R CC tomo · tomo slice 39/76.0]
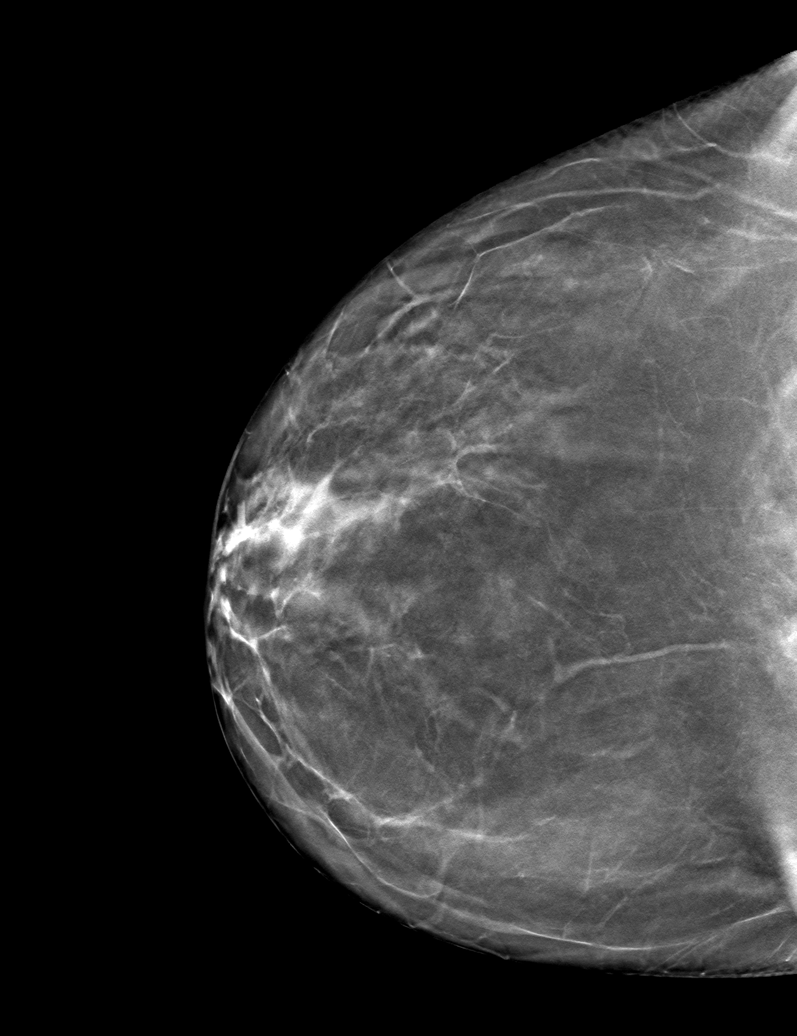

[6 of 30 positions shown; findings below may reference images not displayed]

ACR Breast Density Category b: There are scattered areas of
fibroglandular density.
FINDINGS: There are no findings suspicious for malignancy. The images were
evaluated with computer-aided detection.
IMPRESSION: No mammographic evidence of malignancy. A result letter of this
screening mammogram will be mailed directly to the patient.

RECOMMENDATION:
Screening mammogram in one year. (Code:WJ-I-BG6)

BI-RADS CATEGORY  1: Negative.

## 2023-03-13 ENCOUNTER — Encounter: Payer: Self-pay | Admitting: Podiatry

## 2023-03-13 ENCOUNTER — Other Ambulatory Visit: Payer: Self-pay | Admitting: Podiatry

## 2023-03-13 DIAGNOSIS — S92032A Displaced avulsion fracture of tuberosity of left calcaneus, initial encounter for closed fracture: Secondary | ICD-10-CM

## 2023-03-18 ENCOUNTER — Other Ambulatory Visit: Payer: Self-pay | Admitting: Primary Care

## 2023-03-18 DIAGNOSIS — N951 Menopausal and female climacteric states: Secondary | ICD-10-CM

## 2023-03-18 DIAGNOSIS — Z636 Dependent relative needing care at home: Secondary | ICD-10-CM

## 2023-03-18 NOTE — Telephone Encounter (Signed)
Patient is due for CPE/follow up.  Please schedule, thank you!

## 2023-03-19 NOTE — Telephone Encounter (Signed)
Patient has been scheduled

## 2023-03-20 ENCOUNTER — Encounter: Payer: Self-pay | Admitting: Podiatry

## 2023-03-28 ENCOUNTER — Ambulatory Visit (INDEPENDENT_AMBULATORY_CARE_PROVIDER_SITE_OTHER): Payer: Commercial Managed Care - HMO | Admitting: Primary Care

## 2023-03-28 ENCOUNTER — Encounter: Payer: Self-pay | Admitting: Primary Care

## 2023-03-28 VITALS — BP 142/84 | HR 110 | Temp 98.2°F | Ht 66.0 in | Wt 161.0 lb

## 2023-03-28 DIAGNOSIS — Z720 Tobacco use: Secondary | ICD-10-CM

## 2023-03-28 DIAGNOSIS — N951 Menopausal and female climacteric states: Secondary | ICD-10-CM | POA: Diagnosis not present

## 2023-03-28 DIAGNOSIS — E785 Hyperlipidemia, unspecified: Secondary | ICD-10-CM | POA: Diagnosis not present

## 2023-03-28 DIAGNOSIS — Z0000 Encounter for general adult medical examination without abnormal findings: Secondary | ICD-10-CM

## 2023-03-28 DIAGNOSIS — Z636 Dependent relative needing care at home: Secondary | ICD-10-CM

## 2023-03-28 DIAGNOSIS — Z23 Encounter for immunization: Secondary | ICD-10-CM | POA: Diagnosis not present

## 2023-03-28 DIAGNOSIS — I10 Essential (primary) hypertension: Secondary | ICD-10-CM

## 2023-03-28 DIAGNOSIS — M5441 Lumbago with sciatica, right side: Secondary | ICD-10-CM

## 2023-03-28 DIAGNOSIS — R519 Headache, unspecified: Secondary | ICD-10-CM

## 2023-03-28 DIAGNOSIS — K219 Gastro-esophageal reflux disease without esophagitis: Secondary | ICD-10-CM

## 2023-03-28 DIAGNOSIS — R7303 Prediabetes: Secondary | ICD-10-CM | POA: Diagnosis not present

## 2023-03-28 DIAGNOSIS — G8929 Other chronic pain: Secondary | ICD-10-CM

## 2023-03-28 DIAGNOSIS — Z122 Encounter for screening for malignant neoplasm of respiratory organs: Secondary | ICD-10-CM

## 2023-03-28 DIAGNOSIS — M5442 Lumbago with sciatica, left side: Secondary | ICD-10-CM

## 2023-03-28 DIAGNOSIS — Z1231 Encounter for screening mammogram for malignant neoplasm of breast: Secondary | ICD-10-CM

## 2023-03-28 MED ORDER — PROPRANOLOL HCL ER 120 MG PO CP24
120.0000 mg | ORAL_CAPSULE | Freq: Every day | ORAL | 3 refills | Status: DC
Start: 2023-03-28 — End: 2024-04-01

## 2023-03-28 NOTE — Assessment & Plan Note (Signed)
Stable. Continue venlafaxine ER 75mg  daily

## 2023-03-28 NOTE — Progress Notes (Signed)
Subjective:    Patient ID: Maureen Orozco, female    DOB: 1965/09/09, 57 y.o.   MRN: [id number]  HPI  Maureen Orozco is a very pleasant 57 y.o. female who presents today for complete physical and follow up of chronic conditions.  Immunizations: -Tetanus: Completed in 2016 -Influenza: Influenza vaccine provided today. -Shingles: Completed Shingrix series  Diet: Fair diet.  Exercise: No regular exercise.  Eye exam: Completed years ago Dental exam: Completed this year   Pap Smear: May 2022 Mammogram: May 2022  Colonoscopy: Completed in 2021, due 2028 Lung Cancer Screening: Never completed. Smoked since the age of 59, smokes 1/2 PPD now, smoked 1 PPD.   BP Readings from Last 3 Encounters:  03/28/23 (!) 142/84  01/16/23 134/78  01/03/23 (!) 142/80        Review of Systems  Constitutional:  Negative for unexpected weight change.  HENT:  Negative for rhinorrhea.   Respiratory:  Negative for cough and shortness of breath.   Cardiovascular:  Negative for chest pain.  Gastrointestinal:  Negative for constipation and diarrhea.  Genitourinary:  Negative for difficulty urinating.  Musculoskeletal:  Positive for arthralgias and back pain.  Skin:  Negative for rash.  Allergic/Immunologic: Negative for environmental allergies.  Neurological:  Negative for dizziness and headaches.  Psychiatric/Behavioral:  The patient is not nervous/anxious.          Past Medical History:  Diagnosis Date   Acute viral sinusitis 03/10/2020   Essential hypertension    Hyperlipidemia    RLQ abdominal pain 04/27/2022    Social History   Socioeconomic History   Marital status: Married    Spouse name: Not on file   Number of children: Not on file   Years of education: Not on file   Highest education level: 12th grade  Occupational History   Not on file  Tobacco Use   Smoking status: Every Day    Current packs/day: 0.25    Types: Cigarettes    Passive exposure: Never    Smokeless tobacco: Never  Vaping Use   Vaping status: Some Days  Substance and Sexual Activity   Alcohol use: No    Alcohol/week: 0.0 standard drinks of alcohol   Drug use: Not Currently   Sexual activity: Not on file  Other Topics Concern   Not on file  Social History Narrative   Married.   Works at [employer] in [place].   Has one child.   Enjoys playing computer games, watching TV.   Social Determinants of Health   Financial Resource Strain: Low Risk  (01/28/2023)   Received from [facility]   Overall Financial Resource Strain (CARDIA)    Difficulty of Paying Living Expenses: Not hard at all  Food Insecurity: No Food Insecurity (01/28/2023)   Received from [facility]   Hunger Vital Sign    Worried About Running Out of Food in the Last Year: Never true    Ran Out of Food in the Last Year: Never true  Transportation Needs: No Transportation Needs (01/28/2023)   Received from [facility] - Transportation    Lack of Transportation (Medical): No    Lack of Transportation (Non-Medical): No  Physical Activity: Unknown (01/28/2023)   Received from [facility]   Exercise Vital Sign    Days of Exercise per Week: 0 days    Minutes of Exercise per Session: Not on file  Stress: No Stress Concern Present (01/28/2023)   Received from [facility]  [employer] of Occupational Health - Occupational Stress Questionnaire    Feeling of Stress : Not at all  Recent Concern: Stress - Stress Concern Present (01/02/2023)   [employer] of Occupational Health - Occupational Stress Questionnaire    Feeling of Stress : To some extent  Social Connections: Socially Integrated (01/28/2023)   Received from [facility]   Social Network    How would you rate your social network (family, work, friends)?: Good participation with social networks  Recent Concern: Social Connections - Moderately Isolated (01/02/2023)   Social Connection and Isolation Panel [NHANES]     Frequency of Communication with Friends and Family: More than three times a week    Frequency of Social Gatherings with Friends and Family: More than three times a week    Attends Religious Services: Never    Database administrator or Organizations: No    Attends Engineer, structural: Not on file    Marital Status: Married  Catering manager Violence: Not At Risk (01/28/2023)   Received from [facility]   HITS    Over the last 12 months how often did your partner physically hurt you?: 1    Over the last 12 months how often did your partner insult you or talk down to you?: 1    Over the last 12 months how often did your partner threaten you with physical harm?: 1    Over the last 12 months how often did your partner scream or curse at you?: 1    Past Surgical History:  Procedure Laterality Date   BREAST BIOPSY Left 03/09/2016    hyalinized fibroadenoma    COLONOSCOPY  10/30/2019   DENTAL SURGERY      Family History  Adopted: Yes    No Known Allergies  Current Outpatient Medications on File Prior to Visit  Medication Sig Dispense Refill   amLODipine (NORVASC) 10 MG tablet Take 1 tablet (10 mg total) by mouth daily. for high blood pressure 90 tablet 1   atorvastatin (LIPITOR) 40 MG tablet TAKE 1 TABLET BY MOUTH EVERY DAY FOR CHOLESTEROL 90 tablet 1   cyclobenzaprine (FLEXERIL) 5 MG tablet Take 1 tablet (5 mg total) by mouth at bedtime as needed for muscle spasms. 30 tablet 0   gabapentin (NEURONTIN) 300 MG capsule Take 300 mg by mouth 3 (three) times daily.     hydrochlorothiazide (HYDRODIURIL) 25 MG tablet Take 1 tablet (25 mg total) by mouth daily. for blood pressure 90 tablet 1   ibuprofen (ADVIL) 600 MG tablet Take 1 tablet (600 mg total) by mouth every 8 (eight) hours as needed. 20 tablet 0   olmesartan (BENICAR) 40 MG tablet Take 1 tablet (40 mg total) by mouth daily. For blood pressure 90 tablet 1   omeprazole (PRILOSEC) 40 MG capsule TAKE 1 CAPSULE BY MOUTH DAILY  FOR HEARTBURN 90 capsule 2   venlafaxine XR (EFFEXOR-XR) 75 MG 24 hr capsule TAKE 1 CAPSULE BY MOUTH EVERY MORNING WITH BREAKFAST FOR ANXIETY & HOT FLASHES 90 capsule 0   fluticasone (FLONASE) 50 MCG/ACT nasal spray Place 1 spray into both nostrils 2 (two) times daily. (Patient not taking: Reported on 03/28/2023) 16 g 0   No current facility-administered medications on file prior to visit.    BP (!) 142/84   Pulse (!) 110   Temp 98.2 F (36.8 C) (Temporal)   Ht 5\' 6"  (1.676 m)   Wt 161 lb (73 kg)   SpO2 99%   BMI 25.99  kg/m  Objective:   Physical Exam HENT:     Right Ear: Tympanic membrane and ear canal normal.     Left Ear: Tympanic membrane and ear canal normal.     Nose: Nose normal.  Eyes:     Conjunctiva/sclera: Conjunctivae normal.     Pupils: Pupils are equal, round, and reactive to light.  Neck:     Thyroid: No thyromegaly.  Cardiovascular:     Rate and Rhythm: Normal rate and regular rhythm.     Heart sounds: No murmur heard. Pulmonary:     Effort: Pulmonary effort is normal.     Breath sounds: Normal breath sounds. No rales.  Abdominal:     General: Bowel sounds are normal.     Palpations: Abdomen is soft.     Tenderness: There is no abdominal tenderness.  Musculoskeletal:        General: Normal range of motion.     Cervical back: Neck supple.  Lymphadenopathy:     Cervical: No cervical adenopathy.  Skin:    General: Skin is warm and dry.     Findings: No rash.  Neurological:     Mental Status: She is alert and oriented to person, place, and time.     Cranial Nerves: No cranial nerve deficit.     Deep Tendon Reflexes: Reflexes are normal and symmetric.  Psychiatric:        Mood and Affect: Mood normal.           Assessment & Plan:  Preventative health care Assessment & Plan: Immunizations UTD. Influenza vaccine provided today. Pap smear UTD. Mammogram due, orders placed. Colonoscopy UTD, due 2028 Referral placed for lung cancer screening  program.  Discussed the importance of a healthy diet and regular exercise in order for weight loss, and to reduce the risk of further co-morbidity.  Exam stable. Labs pending.  Follow up in 1 year for repeat physical.    Perimenopausal vasomotor symptoms Assessment & Plan: Controlled  Continue venlafaxine ER 75 mg daily.    Gastroesophageal reflux disease, unspecified whether esophagitis present Assessment & Plan: Controlled.  Continue omeprazole 40 mg daily.   Frequent headaches Assessment & Plan: Improved slightly.  Increase propranolol ER to 120 mg daily. She will update.   Orders: -     Propranolol HCl ER; Take 1 capsule (120 mg total) by mouth daily. For headache prevention  Dispense: 90 capsule; Refill: 3  Hyperlipidemia, unspecified hyperlipidemia type Assessment & Plan: Repeat lipid panel pending.  Discussed the importance of a healthy diet and regular exercise in order for weight loss, and to reduce the risk of further co-morbidity. Continue atorvastatin 40 mg daily.  Orders: -     Lipid panel -     Comprehensive metabolic panel  Prediabetes Assessment & Plan: Repeat A1C pending.  Discussed the importance of a healthy diet and regular exercise in order for weight loss, and to reduce the risk of further co-morbidity.   Orders: -     Hemoglobin A1c -     CBC  Chronic bilateral low back pain with bilateral sciatica Assessment & Plan: Following with spine specialist.  Continue gabapentin 300 mg TID and cyclobenzaprine 5 mg PRN.    Caregiver stress Assessment & Plan: Stable.  Continue venlafaxine ER 75 mg daily.   Screening mammogram for breast cancer -     3D Screening Mammogram, Left and Right; Future  Screening for lung cancer -     Ambulatory Referral for Lung Cancer Scre  Essential hypertension Assessment & Plan: Above goal, improved upon recheck.  Home readings are in the 130s/80s  Continue amlodipine 10 mg daily,  hydrochlorothiazide 25 mg daily, olmesartan 40 mg daily. CMP pending.   Tobacco abuse Assessment & Plan: Referral placed for lung cancer screening program.  Unclear if she qualifies.         Doreene Nest, NP

## 2023-03-28 NOTE — Assessment & Plan Note (Signed)
Repeat A1C pending.  Discussed the importance of a healthy diet and regular exercise in order for weight loss, and to reduce the risk of further co-morbidity.  

## 2023-03-28 NOTE — Assessment & Plan Note (Signed)
Above goal, improved upon recheck.  Home readings are in the 130s/80s  Continue amlodipine 10 mg daily, hydrochlorothiazide 25 mg daily, olmesartan 40 mg daily. CMP pending.

## 2023-03-28 NOTE — Assessment & Plan Note (Addendum)
Immunizations UTD. Influenza vaccine provided today. Pap smear UTD. Mammogram due, orders placed. Colonoscopy UTD, due 2028 Referral placed for lung cancer screening program.  Discussed the importance of a healthy diet and regular exercise in order for weight loss, and to reduce the risk of further co-morbidity.  Exam stable. Labs pending.  Follow up in 1 year for repeat physical.

## 2023-03-28 NOTE — Assessment & Plan Note (Signed)
Repeat lipid panel pending.  Discussed the importance of a healthy diet and regular exercise in order for weight loss, and to reduce the risk of further co-morbidity. Continue atorvastatin 40 mg daily.

## 2023-03-28 NOTE — Patient Instructions (Signed)
Stop by the lab prior to leaving today. I will notify you of your results once received.   [facility] to schedule your [facility].   You will either be contacted via phone regarding your referral to lung cancer screening, or you may receive a letter on your MyChart portal from our referral team with instructions for scheduling an appointment. Please let us know if you have not been contacted by anyone within two weeks.  It was a pleasure to see you today!

## 2023-03-28 NOTE — Assessment & Plan Note (Signed)
Improved slightly.  Increase propranolol ER to 120 mg daily. She will update.

## 2023-03-28 NOTE — Assessment & Plan Note (Signed)
Controlled.  Continue venlafaxine ER 75 mg daily.

## 2023-03-28 NOTE — Assessment & Plan Note (Signed)
Controlled.  Continue omeprazole 40 mg daily. 

## 2023-03-28 NOTE — Assessment & Plan Note (Signed)
Following with spine specialist.  Continue gabapentin 300 mg TID and cyclobenzaprine 5 mg PRN.

## 2023-03-28 NOTE — Assessment & Plan Note (Signed)
Referral placed for lung cancer screening program.  Unclear if she qualifies.

## 2023-03-29 ENCOUNTER — Ambulatory Visit
Admission: RE | Admit: 2023-03-29 | Discharge: 2023-03-29 | Disposition: A | Discharge status: Home / Self Care | Payer: Commercial Managed Care - HMO | Source: Ambulatory Visit | Attending: Podiatry | Admitting: Podiatry

## 2023-03-29 DIAGNOSIS — S92032A Displaced avulsion fracture of tuberosity of left calcaneus, initial encounter for closed fracture: Secondary | ICD-10-CM

## 2023-03-29 DIAGNOSIS — E785 Hyperlipidemia, unspecified: Secondary | ICD-10-CM

## 2023-03-29 DIAGNOSIS — R7303 Prediabetes: Secondary | ICD-10-CM

## 2023-03-29 LAB — CBC
HCT: 40 % (ref 36.0–46.0)
Hemoglobin: 12.8 g/dL (ref 12.0–15.0)
MCHC: 31.8 g/dL (ref 30.0–36.0)
MCV: 102 fl — ABNORMAL HIGH (ref 78.0–100.0)
Platelets: 242 10*3/uL (ref 150.0–400.0)
RBC: 3.93 Mil/uL (ref 3.87–5.11)
RDW: 14.2 % (ref 11.5–15.5)
WBC: 6 10*3/uL (ref 4.0–10.5)

## 2023-03-29 LAB — LIPID PANEL
Cholesterol: 276 mg/dL — ABNORMAL HIGH (ref 0–200)
HDL: 61.2 mg/dL (ref >39.00)
LDL Cholesterol: 197 mg/dL — ABNORMAL HIGH (ref 0–99)
NonHDL: 215.01
Total CHOL/HDL Ratio: 5
Triglycerides: 92 mg/dL (ref 0.0–149.0)
VLDL: 18.4 mg/dL (ref 0.0–40.0)

## 2023-03-29 LAB — COMPREHENSIVE METABOLIC PANEL
ALT: 17 U/L (ref 0–35)
AST: 22 U/L (ref 0–37)
Albumin: 4.2 g/dL (ref 3.5–5.2)
Alkaline Phosphatase: 70 U/L (ref 39–117)
BUN: 21 mg/dL (ref 6–23)
CO2: 25 meq/L (ref 19–32)
Calcium: 9.3 mg/dL (ref 8.4–10.5)
Chloride: 107 meq/L (ref 96–112)
Creatinine, Ser: 0.94 mg/dL (ref 0.40–1.20)
GFR: 68.58 mL/min (ref >60.00)
Glucose, Bld: 75 mg/dL (ref 70–99)
Potassium: 4.2 meq/L (ref 3.5–5.1)
Sodium: 142 meq/L (ref 135–145)
Total Bilirubin: 0.2 mg/dL (ref 0.2–1.2)
Total Protein: 6.9 g/dL (ref 6.0–8.3)

## 2023-03-29 LAB — HEMOGLOBIN A1C: Hgb A1c MFr Bld: 6.1 % (ref 4.6–6.5)

## 2023-04-01 ENCOUNTER — Ambulatory Visit: Payer: Commercial Managed Care - HMO | Admitting: Podiatry

## 2023-04-01 ENCOUNTER — Encounter: Payer: Self-pay | Admitting: Podiatry

## 2023-04-01 ENCOUNTER — Ambulatory Visit: Payer: Commercial Managed Care - HMO

## 2023-04-01 DIAGNOSIS — S92032A Displaced avulsion fracture of tuberosity of left calcaneus, initial encounter for closed fracture: Secondary | ICD-10-CM

## 2023-04-01 DIAGNOSIS — S92032D Displaced avulsion fracture of tuberosity of left calcaneus, subsequent encounter for fracture with routine healing: Secondary | ICD-10-CM | POA: Diagnosis not present

## 2023-04-02 ENCOUNTER — Other Ambulatory Visit: Payer: Self-pay | Admitting: *Deleted

## 2023-04-02 DIAGNOSIS — Z87891 Personal history of nicotine dependence: Secondary | ICD-10-CM

## 2023-04-02 DIAGNOSIS — Z122 Encounter for screening for malignant neoplasm of respiratory organs: Secondary | ICD-10-CM

## 2023-04-02 DIAGNOSIS — F17210 Nicotine dependence, cigarettes, uncomplicated: Secondary | ICD-10-CM

## 2023-04-04 ENCOUNTER — Encounter: Payer: Self-pay | Admitting: Podiatry

## 2023-04-04 NOTE — Progress Notes (Signed)
Subjective: Chief Complaint  Patient presents with   Fracture    Pt stated that she feels like it is doing better but still has some pain     57 year old female presents after a with concerns of fracture to the left foot which occurred on August 1 after hitting her foot.  She states that she has been doing better.  She still wearing the cam boot.  She does not have any new injuries.  She is awaiting CT scan.   Objective: AAO x3, NAD-presents in cam boot DP/PT pulses palpable bilaterally, CRT less than 3 seconds There is still tenderness but decreased tenderness upon palpation of some of the lateral aspect of the foot along the calcaneocuboid joint, lateral portion.  There is localized edema there is no erythema.  This appears to be improved as well.  There is no other areas pinpoint tenderness.  There are some bruising present to the digits.  No open lesions. No pain with calf compression, swelling, warmth, erythema  Assessment: Avulsion fracture left foot  Plan: -All treatment options discussed with the patient including all alternatives, risks, complications.  -X-rays obtained reviewed.  3 views of the foot were obtained.  No new fracture lines observed. -Awaiting CT scan results.  For now continue cam boot.  Ice, elevation as well as compression.  Return in about 4 weeks (around 04/29/2023) for left foot fracture .  Vivi Barrack DPM

## 2023-04-08 ENCOUNTER — Ambulatory Visit
Admission: RE | Admit: 2023-04-08 | Discharge: 2023-04-08 | Disposition: A | Discharge status: Home / Self Care | Payer: Commercial Managed Care - HMO | Source: Ambulatory Visit | Attending: Primary Care

## 2023-04-08 DIAGNOSIS — Z1231 Encounter for screening mammogram for malignant neoplasm of breast: Secondary | ICD-10-CM

## 2023-04-09 ENCOUNTER — Other Ambulatory Visit: Payer: Self-pay | Admitting: Primary Care

## 2023-04-09 DIAGNOSIS — I10 Essential (primary) hypertension: Secondary | ICD-10-CM

## 2023-04-09 DIAGNOSIS — K219 Gastro-esophageal reflux disease without esophagitis: Secondary | ICD-10-CM

## 2023-04-09 DIAGNOSIS — E785 Hyperlipidemia, unspecified: Secondary | ICD-10-CM

## 2023-04-10 ENCOUNTER — Ambulatory Visit: Payer: Commercial Managed Care - HMO | Admitting: Adult Health

## 2023-04-10 ENCOUNTER — Encounter: Payer: Self-pay | Admitting: Adult Health

## 2023-04-10 DIAGNOSIS — F17210 Nicotine dependence, cigarettes, uncomplicated: Secondary | ICD-10-CM | POA: Diagnosis not present

## 2023-04-10 MED ORDER — OMEPRAZOLE 40 MG PO CPDR: DELAYED_RELEASE_CAPSULE | ORAL | 3 refills | Status: DC

## 2023-04-10 NOTE — Patient Instructions (Addendum)
Thank you for participating in the [facility] Screening Program. It was our pleasure to meet you today. We will call you with the results of your scan within the next few days. Your scan will be assigned a Lung RADS category score by the physicians reading the scans.  This Lung RADS score determines follow up scanning.  See below for description of categories, and follow up screening recommendations. We will be in touch to schedule your follow up screening annually or based on recommendations of our providers. We will fax a copy of your scan results to your Primary Care Physician, or the physician who referred you to the program, to ensure they have the results. Please call the office if you have any questions or concerns regarding your scanning experience or results.  Our office number is [phone]. Please speak with Abigail Miyamoto, RN., Karlton Lemon RN, or Pietro Cassis RN. They are  our Lung Cancer Screening RN.'s If They are unavailable when you call, Please leave a message on the voice mail. We will return your call at our earliest convenience.This voice mail is monitored several times a day.  Remember, if your scan is normal, we will scan you annually as long as you continue to meet the criteria for the program. (Age 55-80, Current smoker or smoker who has quit within the last 15 years). If you are a smoker, remember, quitting is the single most powerful action that you can take to decrease your risk of lung cancer and other pulmonary, breathing related problems. We know quitting is hard, and we are here to help.  Please let us know if there is anything we can do to help you meet your goal of quitting. If you are a former smoker, Counselling psychologist. We are proud of you! Remain smoke free! Remember you can refer friends or family members through the number above.  We will screen them to make sure they meet criteria for the program. Thank you for helping [place] take better care of you  by participating in Lung Screening.   Lung RADS Categories:  Lung RADS 1: no nodules or definitely non-concerning nodules.  Recommendation is for a repeat annual scan in 12 months.  Lung RADS 2:  nodules that are non-concerning in appearance and behavior with a very low likelihood of becoming an active cancer. Recommendation is for a repeat annual scan in 12 months.  Lung RADS 3: nodules that are probably non-concerning , includes nodules with a low likelihood of becoming an active cancer.  Recommendation is for a 55-month repeat screening scan. Often noted after an upper respiratory illness. We will be in touch to make sure you have no questions, and to schedule your 45-month scan.  Lung RADS 4 A: nodules with concerning findings, recommendation is most often for a follow up scan in 3 months or additional testing based on our provider's assessment of the scan. We will be in touch to make sure you have no questions and to schedule the recommended 3 month follow up scan.  Lung RADS 4 B:  indicates findings that are concerning. We will be in touch with you to schedule additional diagnostic testing based on our provider's  assessment of the scan.  You can receive free nicotine replacement therapy ( patches, gum or mints) by calling 1-800-QUIT NOW. Please call so we can get you on the path to becoming  a non-smoker. I know it is hard, but you can do this!  Other options for assistance in  smoking cessation ( As covered by your insurance benefits)  Hypnosis for smoking cessation  [employer] [phone]  Acupuncture for smoking cessation  [employer] [phone]  Steps to Quit Smoking Smoking tobacco is the leading cause of preventable death. It can affect almost every organ in the body. Smoking puts you and people around you at risk for many serious, long-lasting (chronic) diseases. Quitting smoking can be hard, but it is one of the best things that you can do for your  health. It is never too late to quit. Do not give up if you cannot quit the first time. Some people need to try many times to quit. Do your best to stick to your quit plan, and talk with your doctor if you have any questions or concerns. How do I get ready to quit? Pick a date to quit. Set a date within the next 2 weeks to give you time to prepare. Write down the reasons why you are quitting. Keep this list in places where you will see it often. Tell your family, friends, and co-workers that you are quitting. Their support is important. Talk with your doctor about the choices that may help you quit. Find out if your health insurance will pay for these treatments. Know the people, places, things, and activities that make you want to smoke (triggers). Avoid them. What first steps can I take to quit smoking? Throw away all cigarettes at home, at work, and in your car. Throw away the things that you use when you smoke, such as ashtrays and lighters. Clean your car. Empty the ashtray. Clean your home, including curtains and carpets. What can I do to help me quit smoking? Talk with your doctor about taking medicines and seeing a counselor. You are more likely to succeed when you do both. If you are pregnant or breastfeeding: Talk with your doctor about counseling or other ways to quit smoking. Do not take medicine to help you quit smoking unless your doctor tells you to. Quit right away Quit smoking completely, instead of slowly cutting back on how much you smoke over a period of time. Stopping smoking right away may be more successful than slowly quitting. Go to counseling. In-person is best if this is an option. You are more likely to quit if you go to counseling sessions regularly. Take medicine You may take medicines to help you quit. Some medicines need a prescription, and some you can buy over-the-counter. Some medicines may contain a drug called nicotine to replace the nicotine in cigarettes.  Medicines may: Help you stop having the desire to smoke (cravings). Help to stop the problems that come when you stop smoking (withdrawal symptoms). Your doctor may ask you to use: Nicotine patches, gum, or lozenges. Nicotine inhalers or sprays. Non-nicotine medicine that you take by mouth. Find resources Find resources and other ways to help you quit smoking and remain smoke-free after you quit. They include: Online chats with a Veterinary surgeon. Phone quitlines. Printed Materials engineer. Support groups or group counseling. Text messaging programs. Mobile phone apps. Use apps on your mobile phone or tablet that can help you stick to your quit plan. Examples of free services include Quit Guide from the CDC and smokefree.gov  What can I do to make it easier to quit?  Talk to your family and friends. Ask them to support and encourage you. Call a phone quitline, such as 1-800-QUIT-NOW, reach out to support groups, or work with a Veterinary surgeon. Ask people  who smoke to not smoke around you. Avoid places that make you want to smoke, such as: Bars. Parties. Smoke-break areas at work. Spend time with people who do not smoke. Lower the stress in your life. Stress can make you want to smoke. Try these things to lower stress: Getting regular exercise. Doing deep-breathing exercises. Doing yoga. Meditating. What benefits will I see if I quit smoking? Over time, you may have: A better sense of smell and taste. Less coughing and sore throat. A slower heart rate. Lower blood pressure. Clearer skin. Better breathing. Fewer sick days. Summary Quitting smoking can be hard, but it is one of the best things that you can do for your health. Do not give up if you cannot quit the first time. Some people need to try many times to quit. When you decide to quit smoking, make a plan to help you succeed. Quit smoking right away, not slowly over a period of time. When you start quitting, get help and support  to keep you smoke-free. This information is not intended to replace advice given to you by your health care provider. Make sure you discuss any questions you have with your health care provider. Document Revised: 06/23/2021 Document Reviewed: 06/23/2021 Elsevier Patient Education  2024 [employer].

## 2023-04-10 NOTE — Progress Notes (Signed)
  Virtual Visit via Telephone Note  I connected with Clorinda Boldon, 04/10/23 8:56 AM by a telemedicine application and verified that I am speaking with the correct person using two identifiers.  Location: Patient: home   I discussed the limitations of evaluation and management by telemedicine and the availability of in person appointments. The patient expressed understanding and agreed to proceed.   Shared Decision Making Visit Lung Cancer Screening Program [phone])   Eligibility:  57 y.o. Pack Years Smoking History Calculation 25 (# packs/per year x # years smoked) age 45-52 1/2-1ppd Recent History of coughing up blood  no Unexplained weight loss? no ( >Than 15 pounds within the last 6 months ) Prior History Lung / other cancer no (Diagnosis within the last 5 years already requiring surveillance chest CT Scans). Smoking Status Current Smoker   Visit Components: Discussion included one or more decision making aids. YES Discussion included risk/benefits of screening. YES Discussion included potential follow up diagnostic testing for abnormal scans. YES Discussion included meaning and risk of over diagnosis. YES Discussion included meaning and risk of False Positives. YES Discussion included meaning of total radiation exposure. YES  Counseling Included: Importance of adherence to annual lung cancer LDCT screening. YES Impact of comorbidities on ability to participate in the program. YES Ability and willingness to under diagnostic treatment. YES  Smoking Cessation Counseling: Current Smokers:  Discussed importance of smoking cessation. yes Information about tobacco cessation classes and interventions provided to patient. yes Patient provided with "ticket" for LDCT Scan. yes Diagnosis Code: Tobacco Use Z72.0 Asymptomatic Patient yes  Counseling (Intermediate counseling: > three minutes counseling) U0454 Former Smokers:  Discussed the importance of maintaining cigarette  abstinence. yes Diagnosis Code: Personal History of Nicotine Dependence. U98.119 Information about tobacco cessation classes and interventions provided to patient. Yes Patient provided with "ticket" for LDCT Scan. yes Written Order for Lung Cancer Screening with LDCT placed in Epic. Yes (CT Chest Lung Cancer Screening Low Dose W/O CM) [id number]  Z12.2-Screening of respiratory organs Z87.891-Personal history of nicotine dependence   Danford Bad 04/10/23

## 2023-04-12 ENCOUNTER — Ambulatory Visit
Admission: RE | Admit: 2023-04-12 | Discharge: 2023-04-12 | Disposition: A | Discharge status: Home / Self Care | Payer: Commercial Managed Care - HMO | Source: Ambulatory Visit | Attending: Acute Care | Admitting: Acute Care

## 2023-04-12 DIAGNOSIS — Z122 Encounter for screening for malignant neoplasm of respiratory organs: Secondary | ICD-10-CM

## 2023-04-12 DIAGNOSIS — F17210 Nicotine dependence, cigarettes, uncomplicated: Secondary | ICD-10-CM

## 2023-04-12 DIAGNOSIS — Z87891 Personal history of nicotine dependence: Secondary | ICD-10-CM

## 2023-04-17 ENCOUNTER — Encounter: Payer: Self-pay | Admitting: Podiatry

## 2023-04-18 ENCOUNTER — Other Ambulatory Visit: Payer: Self-pay | Admitting: Podiatry

## 2023-04-18 MED ORDER — HYDROCODONE-ACETAMINOPHEN 5-325 MG PO TABS
1.0000 | ORAL_TABLET | Freq: Four times a day (QID) | ORAL | 0 refills | Status: DC | PRN
Start: 2023-04-18 — End: 2023-10-10

## 2023-04-29 ENCOUNTER — Ambulatory Visit (INDEPENDENT_AMBULATORY_CARE_PROVIDER_SITE_OTHER): Payer: Managed Care, Other (non HMO) | Admitting: Podiatry

## 2023-04-29 ENCOUNTER — Ambulatory Visit (INDEPENDENT_AMBULATORY_CARE_PROVIDER_SITE_OTHER): Payer: Managed Care, Other (non HMO)

## 2023-04-29 DIAGNOSIS — S92032D Displaced avulsion fracture of tuberosity of left calcaneus, subsequent encounter for fracture with routine healing: Secondary | ICD-10-CM | POA: Diagnosis not present

## 2023-04-29 DIAGNOSIS — E559 Vitamin D deficiency, unspecified: Secondary | ICD-10-CM | POA: Diagnosis not present

## 2023-04-29 DIAGNOSIS — L600 Ingrowing nail: Secondary | ICD-10-CM

## 2023-04-29 NOTE — Progress Notes (Signed)
Subjective: No chief complaint on file.    57 year old female presents after a with concerns of fracture to the left foot which occurred on August 1 after hitting her foot.  She states she still has pain to the area, pointing to the lateral aspect of the foot.  She also describes a burning sensation to this area which started after the injury.  She said after the injury to her foot she had no other injuries.  Objective: AAO x3, NAD-presents in cam boot DP/PT pulses palpable bilaterally, CRT less than 3 seconds There is still tenderness but decreased tenderness upon palpation of some of the lateral aspect of the foot along the calcaneocuboid joint, lateral portion.  There is localized edema there is no erythema.  This appears to be improved as well, although slowly there is no other areas pinpoint tenderness.  There are some bruising present to the digits.  No open lesions. No pain with calf compression, swelling, warmth, erythema  Assessment: Avulsion fracture left foot  Plan: -All treatment options discussed with the patient including all alternatives, risks, complications.  -X-rays obtained reviewed.  3 views of the foot were obtained.  No new fracture lines observed. -I will order vitamin D level. -Continue cam boot for now -Presents for suspected appointment recommend bone stimulator -We discussed under symptoms likely result of the injury, inflammation of symptoms process discussed nerve conduction testing.  Continue gabapentin for she is already taking.  This was recently increased.  Return for left foot fracture, x-ray in 3-4 weeks .  Vivi Barrack DPM

## 2023-04-30 ENCOUNTER — Encounter: Payer: Self-pay | Admitting: Podiatry

## 2023-04-30 ENCOUNTER — Other Ambulatory Visit: Payer: Self-pay | Admitting: Podiatry

## 2023-04-30 DIAGNOSIS — E559 Vitamin D deficiency, unspecified: Secondary | ICD-10-CM

## 2023-04-30 LAB — VITAMIN D 25 HYDROXY (VIT D DEFICIENCY, FRACTURES): Vit D, 25-Hydroxy: 11.4 ng/mL — ABNORMAL LOW (ref 30.0–100.0)

## 2023-04-30 MED ORDER — VITAMIN D (ERGOCALCIFEROL) 1.25 MG (50000 UNIT) PO CAPS: 50000.0000 [IU] | ORAL_CAPSULE | ORAL | 0 refills | Status: DC

## 2023-05-01 ENCOUNTER — Other Ambulatory Visit: Payer: Self-pay | Admitting: Acute Care

## 2023-05-01 DIAGNOSIS — Z122 Encounter for screening for malignant neoplasm of respiratory organs: Secondary | ICD-10-CM

## 2023-05-01 DIAGNOSIS — Z87891 Personal history of nicotine dependence: Secondary | ICD-10-CM

## 2023-05-02 NOTE — Telephone Encounter (Signed)
Appt has been changed for labs only

## 2023-05-02 NOTE — Telephone Encounter (Signed)
Can we change her appointment on December 3 to a lab only appointment?  She is already aware.

## 2023-05-03 ENCOUNTER — Other Ambulatory Visit: Payer: Self-pay | Admitting: Podiatry

## 2023-05-03 DIAGNOSIS — M792 Neuralgia and neuritis, unspecified: Secondary | ICD-10-CM

## 2023-05-03 NOTE — Telephone Encounter (Signed)
Can you fax the referral for the [facility]? Thanks!

## 2023-05-09 DIAGNOSIS — I10 Essential (primary) hypertension: Secondary | ICD-10-CM

## 2023-05-09 DIAGNOSIS — I2510 Atherosclerotic heart disease of native coronary artery without angina pectoris: Secondary | ICD-10-CM

## 2023-05-13 ENCOUNTER — Encounter: Payer: Self-pay | Admitting: Podiatry

## 2023-05-16 ENCOUNTER — Encounter: Payer: Self-pay | Admitting: Podiatry

## 2023-05-21 ENCOUNTER — Encounter: Payer: Self-pay | Admitting: Podiatry

## 2023-05-21 ENCOUNTER — Ambulatory Visit (INDEPENDENT_AMBULATORY_CARE_PROVIDER_SITE_OTHER): Payer: Managed Care, Other (non HMO) | Admitting: Podiatry

## 2023-05-21 ENCOUNTER — Ambulatory Visit (INDEPENDENT_AMBULATORY_CARE_PROVIDER_SITE_OTHER): Payer: Managed Care, Other (non HMO)

## 2023-05-21 DIAGNOSIS — S92032G Displaced avulsion fracture of tuberosity of left calcaneus, subsequent encounter for fracture with delayed healing: Secondary | ICD-10-CM

## 2023-05-21 DIAGNOSIS — M778 Other enthesopathies, not elsewhere classified: Secondary | ICD-10-CM | POA: Diagnosis not present

## 2023-05-21 DIAGNOSIS — G8929 Other chronic pain: Secondary | ICD-10-CM

## 2023-05-22 MED ORDER — CYCLOBENZAPRINE HCL 10 MG PO TABS
10.0000 mg | ORAL_TABLET | Freq: Three times a day (TID) | ORAL | 0 refills | Status: AC | PRN
Start: 2023-05-22 — End: ?

## 2023-05-23 NOTE — Progress Notes (Signed)
Subjective: Chief Complaint  Patient presents with   Foot Pain    Follow up left foot fracture patient still in pain     57 year old female presents after a with concerns of fracture to the left foot which occurred on August 1 after hitting her foot.  She does state that is feeling a bit better.  She tried walking out of the boot and still causing pain that she is still wearing the cam boot.  No new injuries.  Has not yet had a nerve conduction test, she states that the numbness, burning is unchanged.  Objective: AAO x3, NAD-presents in cam boot DP/PT pulses palpable bilaterally, CRT less than 3 seconds There is still tenderness but decreased tenderness upon palpation of some of the lateral aspect of the foot along the calcaneocuboid joint, lateral portion.  There is localized edema there is no erythema.  This appears to be improved as well, although slowly there is no other areas pinpoint tenderness.  There is no bruising noted today.  No open lesions. No pain with calf compression, swelling, warmth, erythema  Assessment: Avulsion fracture left foot  Plan: -All treatment options discussed with the patient including all alternatives, risks, complications.  -X-rays obtained reviewed.  3 views of the foot were obtained.  No definitive evidence of acute fracture present. -Discussed continuing cam boot for now that she starts to improve strength gradually is asymptomatic she was tolerated. -She did not get her vitamin D rechecked -Nerve conduction test pending  Return for foot pain in 3-4 weeks.  Vivi Barrack DPM

## 2023-05-24 ENCOUNTER — Other Ambulatory Visit: Payer: Self-pay | Admitting: Primary Care

## 2023-05-24 DIAGNOSIS — Z636 Dependent relative needing care at home: Secondary | ICD-10-CM

## 2023-05-24 DIAGNOSIS — N951 Menopausal and female climacteric states: Secondary | ICD-10-CM

## 2023-05-27 ENCOUNTER — Ambulatory Visit: Payer: Managed Care, Other (non HMO) | Admitting: Podiatry

## 2023-05-30 ENCOUNTER — Other Ambulatory Visit: Payer: Self-pay | Admitting: Podiatry

## 2023-05-30 LAB — VITAMIN D 25 HYDROXY (VIT D DEFICIENCY, FRACTURES): Vit D, 25-Hydroxy: 24 ng/mL — ABNORMAL LOW (ref 30.0–100.0)

## 2023-05-30 MED ORDER — VITAMIN D (ERGOCALCIFEROL) 1.25 MG (50000 UNIT) PO CAPS: 50000.0000 [IU] | ORAL_CAPSULE | ORAL | 0 refills | Status: DC

## 2023-06-10 ENCOUNTER — Encounter: Payer: Self-pay | Admitting: Podiatry

## 2023-06-10 ENCOUNTER — Other Ambulatory Visit: Payer: Self-pay | Admitting: Podiatry

## 2023-06-10 DIAGNOSIS — G5732 Lesion of lateral popliteal nerve, left lower limb: Secondary | ICD-10-CM

## 2023-06-10 NOTE — Telephone Encounter (Signed)
Called patient to go over results. Referral to neurology as her symptoms seem to be worsening.

## 2023-06-12 ENCOUNTER — Encounter: Payer: Self-pay | Admitting: Neurology

## 2023-06-12 ENCOUNTER — Encounter: Payer: Self-pay | Admitting: Podiatry

## 2023-06-18 ENCOUNTER — Other Ambulatory Visit (INDEPENDENT_AMBULATORY_CARE_PROVIDER_SITE_OTHER): Payer: Commercial Managed Care - HMO

## 2023-06-18 DIAGNOSIS — E785 Hyperlipidemia, unspecified: Secondary | ICD-10-CM

## 2023-06-18 DIAGNOSIS — R7303 Prediabetes: Secondary | ICD-10-CM

## 2023-06-19 DIAGNOSIS — R7303 Prediabetes: Secondary | ICD-10-CM

## 2023-06-19 DIAGNOSIS — I2510 Atherosclerotic heart disease of native coronary artery without angina pectoris: Secondary | ICD-10-CM

## 2023-06-19 DIAGNOSIS — E785 Hyperlipidemia, unspecified: Secondary | ICD-10-CM

## 2023-06-19 LAB — LIPID PANEL
Cholesterol: 188 mg/dL (ref 0–200)
HDL: 50.6 mg/dL (ref >39.00)
LDL Cholesterol: 120 mg/dL — ABNORMAL HIGH (ref 0–99)
NonHDL: 136.92
Total CHOL/HDL Ratio: 4
Triglycerides: 85 mg/dL (ref 0.0–149.0)
VLDL: 17 mg/dL (ref 0.0–40.0)

## 2023-06-19 LAB — HEMOGLOBIN A1C: Hgb A1c MFr Bld: 6.3 % (ref 4.6–6.5)

## 2023-06-19 MED ORDER — ATORVASTATIN CALCIUM 80 MG PO TABS: 80.0000 mg | ORAL_TABLET | Freq: Every day | ORAL | 2 refills | Status: DC

## 2023-06-20 ENCOUNTER — Ambulatory Visit: Payer: Managed Care, Other (non HMO) | Admitting: Podiatry

## 2023-06-26 ENCOUNTER — Ambulatory Visit: Payer: Commercial Managed Care - HMO | Admitting: Neurology

## 2023-06-26 ENCOUNTER — Encounter: Payer: Self-pay | Admitting: Neurology

## 2023-06-26 VITALS — BP 154/96 | HR 106 | Ht 66.0 in | Wt 170.0 lb

## 2023-06-26 DIAGNOSIS — R200 Anesthesia of skin: Secondary | ICD-10-CM

## 2023-06-26 DIAGNOSIS — M5417 Radiculopathy, lumbosacral region: Secondary | ICD-10-CM

## 2023-06-26 NOTE — Patient Instructions (Signed)
Nerve ultrasound of left peroneal nerve will be ordered.

## 2023-06-26 NOTE — Progress Notes (Signed)
[facility] Clinic Note - Initial Visit   Date: 06/26/2023   Maureen Orozco MRN: [id number] DOB: 1966-05-13   Dear Dr. Ardelle Anton:  Thank you for your kind referral of Maureen Orozco for consultation of left foot tingling. Although her history is well known to you, please allow [place] to reiterate it for the purpose of our medical record. The patient was accompanied to the clinic by self.    Maureen Orozco is a 57 y.o. right-handed female with hypertension, GERD, tobacco use, and lumbar radiculopathy presenting for evaluation of left foot numbness.   IMPRESSION/PLAN: Left foot numbness, likely stemming from L5 radiculopathy.  MRI lumbar spine with severe biforaminal stenosis compressing the L5 nerve roots.  NCS/EMG performed at another office was personally reviewed and shows asymmetrically reduced peroneal motor response at the EDB and superficial peroneal amplitude, without needle changes on EMG.  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  Peroneal CMAP at the tibialis anterior was not tested.  Based on her history and exam, I think most of her foot numbness and EMG findings are stemming from known L5 radiculopathy with proximal lying dorsal root ganglion, and not peroneal neuropathy.  To be complete, I will order nerve ultrasound of the left peroneal nerve, but my suspicion for nerve entrapment is very low.  She is already seeing spine specialist which she will continue to follow-up with.   ------------------------------------------------------------- History of present illness: She had a fall in August 1, which resulted in left foot fracture and managed conservatively.  Several weeks following this, she began having numbness and tingling over the top of the left foot. Symptoms are constant and worse with prolonged standing and walking.  She endorses chronic low back pain and is followed by pain management.  She has received ESI which helped with back  pain some, but no change to foot numbness.  MRI lumbar spine from August 2024 shows multilevel degenerative changes with severe biformainal stenosis at L5-S1 with compression of the L5 nerve roots.   Out-side paper records, electronic medical record, and images have been reviewed where available and summarized as:  MRI lumbar spine wo contrast 02/22/2023: 1.  Spondylolysis with grade 2 spondylolisthesis and superimposed degenerative changes L5-S1 resulting in severe bilateral foraminal stenosis and compression of the exiting L5 nerve roots.  2.  Additional multilevel lumbar spondylosis. Generalized disc bulges and a small broad-based central disc protrusions contribute to mild-to-moderate spinal canal stenosis at L2-3 and mild spinal canal stenosis at L3-4. No associated impingement of the traversing nerve roots appreciated at either level. Mild right and moderate left foraminal stenosis noted L4-5.  3.  No acute bony abnormality.   NCS/EMG of the left leg 06/04/2023 performed at [facility]:  findings show reduced peroneal CMAP at EDB (0.33 mV) and reduced superficial peroneal sensory amplitude (3.3).  Impression: Left peroneal neuropathy.  Lab Results  Component Value Date   HGBA1C 6.3 06/18/2023    Past Medical History:  Diagnosis Date   Acute viral sinusitis 03/10/2020   Essential hypertension    Hyperlipidemia    RLQ abdominal pain 04/27/2022    Past Surgical History:  Procedure Laterality Date   BREAST BIOPSY Left 03/09/2016    hyalinized fibroadenoma    COLONOSCOPY  10/30/2019   DENTAL SURGERY       Medications:  Outpatient Encounter Medications as of 06/26/2023  Medication Sig   amLODipine (NORVASC) 10 MG tablet TAKE 1 TABLET BY MOUTH EVERY DAY FOR BLOOD PRESSURE  atorvastatin (LIPITOR) 80 MG tablet Take 1 tablet (80 mg total) by mouth daily. for cholesterol.   cyclobenzaprine (FLEXERIL) 10 MG tablet Take 1 tablet (10 mg total) by mouth 3 (three) times daily as  needed for muscle spasms.   gabapentin (NEURONTIN) 300 MG capsule Take 300 mg by mouth 3 (three) times daily. Take 600 mg three times a day. After 5 days take 3 tablets in the morning, 2 tablets in the afternoon, and 3 tablets in the evening.   hydrochlorothiazide (HYDRODIURIL) 25 MG tablet Take 1 tablet (25 mg total) by mouth daily. for blood pressure   HYDROcodone-acetaminophen (NORCO/VICODIN) 5-325 MG tablet Take 1 tablet by mouth every 6 (six) hours as needed.   olmesartan (BENICAR) 40 MG tablet TAKE 1 TABLET BY MOUTH EVERY DAY FOR BLOOD PRESSURE   omeprazole (PRILOSEC) 40 MG capsule TAKE 1 CAPSULE BY MOUTH DAILY FOR HEARTBURN   propranolol ER (INDERAL LA) 120 MG 24 hr capsule Take 1 capsule (120 mg total) by mouth daily. For headache prevention   venlafaxine XR (EFFEXOR-XR) 75 MG 24 hr capsule TAKE 1 CAPSULE BY MOUTH EVERY MORNING WITH BREAKFAST FOR ANXIETY & HOT FLASHES   Vitamin D, Ergocalciferol, (DRISDOL) 1.25 MG (50000 UNIT) CAPS capsule Take 1 capsule (50,000 Units total) by mouth every 7 (seven) days.   [DISCONTINUED] fluticasone (FLONASE) 50 MCG/ACT nasal spray Place 1 spray into both nostrils 2 (two) times daily. (Patient not taking: Reported on 06/26/2023)   [DISCONTINUED] ibuprofen (ADVIL) 600 MG tablet Take 1 tablet (600 mg total) by mouth every 8 (eight) hours as needed. (Patient not taking: Reported on 06/26/2023)   No facility-administered encounter medications on file as of 06/26/2023.    Allergies: No Known Allergies  Family History: Family History  Adopted: Yes    Social History: Social History   Tobacco Use   Smoking status: Every Day    Current packs/day: 0.25    Types: Cigarettes    Passive exposure: Never   Smokeless tobacco: Never  Vaping Use   Vaping status: Some Days  Substance Use Topics   Alcohol use: No    Alcohol/week: 0.0 standard drinks of alcohol   Drug use: Not Currently   Social History   Social History Narrative   Married.   Works at  [employer] in [place].   Has one child.   Enjoys playing computer games, watching TV.         Patient is adopted       Are you right handed or left handed? Right Handed    Are you currently employed ? 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    Do you live at home alone? No    Who lives with you? Husband, son, sister in law, husbands step dad.   What type of home do you live in: 1 story or 2 story? Lives in a two story home.        Vital Signs:  BP (!) 154/96   Pulse (!) 106   Ht 5\' 6"  (1.676 m)   Wt 170 lb (77.1 kg)   SpO2 99%   BMI 27.44 kg/m   Neurological Exam: MENTAL STATUS including orientation to time, place, person, recent and remote memory, attention span and concentration, language, and fund of knowledge is normal.  Speech is not dysarthric.  CRANIAL NERVES: II:  No visual field defects.     III-IV-VI: Pupils equal round and reactive to light.  Normal conjugate, extra-ocular eye movements  in all directions of gaze.  No nystagmus.  No ptosis.   V:  Normal facial sensation.    VII:  Normal facial symmetry and movements.   VIII:  Normal hearing and vestibular function.   IX-X:  Normal palatal movement.   XI:  Normal shoulder shrug and head rotation.   XII:  Normal tongue strength and range of motion, no deviation or fasciculation.  MOTOR:  No atrophy, fasciculations or abnormal movements.  No pronator drift.   Upper Extremity:  Right  Left  Deltoid  5/5   5/5   Biceps  5/5   5/5   Triceps  5/5   5/5   Wrist extensors  5/5   5/5   Wrist flexors  5/5   5/5   Finger extensors  5/5   5/5   Finger flexors  5/5   5/5   Dorsal interossei  5/5   5/5   Abductor pollicis  5/5   5/5   Tone (Ashworth scale)  0  0   Lower Extremity:  Right  Left  Hip flexors  5/5   5/5   Knee flexors  5/5   5/5   Knee extensors  5/5   5/5   Dorsiflexors  5/5   5/5   Plantarflexors  5/5   5/5   Toe extensors  5/5   5/5   Toe flexors  5/5   5/5   Tone (Ashworth  scale)  0  0   MSRs:                                           Right        Left brachioradialis 2+  2+  biceps 2+  2+  triceps 2+  2+  patellar 2+  2+  ankle jerk 2+  2+  Hoffman no  no  plantar response down  down   SENSORY: Reduced pin prick and temperature over the dorsum of the left foot only. Sensation above the ankle and lateral foot is intact.  Sensation in the right foot and hands intact.  Romberg's sign absent.   COORDINATION/GAIT: Normal finger-to- nose-finger.  Intact rapid alternating movements bilaterally.  Gait narrow based and stable. Tandem and stressed gait intact.     Thank you for allowing me to participate in patient's care.  If I can answer any additional questions, I would be pleased to do so.    Sincerely,    Kjerstin Abrigo K. Allena Katz, DO

## 2023-06-29 ENCOUNTER — Ambulatory Visit (INDEPENDENT_AMBULATORY_CARE_PROVIDER_SITE_OTHER): Payer: Managed Care, Other (non HMO) | Admitting: Podiatry

## 2023-06-29 DIAGNOSIS — S92032G Displaced avulsion fracture of tuberosity of left calcaneus, subsequent encounter for fracture with delayed healing: Secondary | ICD-10-CM

## 2023-06-29 DIAGNOSIS — G5732 Lesion of lateral popliteal nerve, left lower limb: Secondary | ICD-10-CM | POA: Diagnosis not present

## 2023-07-01 ENCOUNTER — Encounter: Payer: Self-pay | Admitting: Podiatry

## 2023-07-01 ENCOUNTER — Other Ambulatory Visit: Payer: Self-pay | Admitting: Podiatry

## 2023-07-01 DIAGNOSIS — E559 Vitamin D deficiency, unspecified: Secondary | ICD-10-CM

## 2023-07-02 ENCOUNTER — Encounter: Payer: Self-pay | Admitting: Cardiology

## 2023-07-02 ENCOUNTER — Telehealth: Payer: Self-pay

## 2023-07-02 ENCOUNTER — Ambulatory Visit: Payer: Commercial Managed Care - HMO | Attending: Cardiology | Admitting: Cardiology

## 2023-07-02 VITALS — BP 134/86 | HR 104 | Resp 15 | Ht 66.0 in | Wt 169.0 lb

## 2023-07-02 DIAGNOSIS — I2584 Coronary atherosclerosis due to calcified coronary lesion: Secondary | ICD-10-CM

## 2023-07-02 DIAGNOSIS — I2510 Atherosclerotic heart disease of native coronary artery without angina pectoris: Secondary | ICD-10-CM

## 2023-07-02 DIAGNOSIS — E782 Mixed hyperlipidemia: Secondary | ICD-10-CM | POA: Diagnosis not present

## 2023-07-02 DIAGNOSIS — R7303 Prediabetes: Secondary | ICD-10-CM

## 2023-07-02 DIAGNOSIS — F17200 Nicotine dependence, unspecified, uncomplicated: Secondary | ICD-10-CM | POA: Diagnosis not present

## 2023-07-02 DIAGNOSIS — I10 Essential (primary) hypertension: Secondary | ICD-10-CM | POA: Diagnosis not present

## 2023-07-02 NOTE — Patient Instructions (Signed)
Medication Instructions:   Your physician recommends that you continue on your current medications as directed. Please refer to the Current Medication list given to you today.  *If you need a refill on your cardiac medications before your next appointment, please call your pharmacy*    Testing/Procedures:  Your physician has requested that you have an echocardiogram. Echocardiography is a painless test that uses sound waves to create images of your heart. It provides your doctor with information about the size and shape of your heart and how well your heart's chambers and valves are working. This procedure takes approximately one hour. There are no restrictions for this procedure. Please do NOT wear cologne, perfume, aftershave, or lotions (deodorant is allowed). Please arrive 15 minutes prior to your appointment time.  Please note: We ask at that you not bring children with you during ultrasound (echo/ vascular) testing. Due to room size and safety concerns, children are not allowed in the ultrasound rooms during exams. Our front office staff cannot provide observation of children in our lobby area while testing is being conducted. An adult accompanying a patient to their appointment will only be allowed in the ultrasound room at the discretion of the ultrasound technician under special circumstances. We apologize for any inconvenience.      Exercise Tolerance Test  Please arrive 15 minutes prior to your appointment time for registration and insurance purposes.  The test will take approximately 45 minutes to complete.  How to prepare for your Exercise Stress Test:  DO NOT EAT OR DRINK 4 HOURS PRIOR TO THIS EXAM--YOU MAY HAVE WATER Do bring a list of your current medications with you.  If not listed below, you may take your medications as normal.  HOLD YOUR PROPRANOLOL THE DAY OF THIS EXAM Do wear comfortable clothes (no dresses or overalls) and walking shoes, tennis shoes preferred (no  heels or open toed shoes are allowed) Do Not wear cologne, perfume, aftershave or lotions (deodorant is allowed). Please report to [address], [address] for your test.  If these instructions are not followed, your test will have to be rescheduled.  If you have questions or concerns about your appointment, you can call the [facility] at [phone].  If you cannot keep your appointment, please provide 24 hours notification to the [facility], to avoid a possible $50 charge to your account.    Follow-Up:  AS NEEDED WITH DR. Odis Hollingshead

## 2023-07-02 NOTE — Progress Notes (Signed)
Cardiology Office Note:    Date:  07/02/2023  NAME:  Maureen Orozco    MRN: [id number] DOB:  05/18/1966   PCP:  Doreene Nest, NP  Former Cardiology Providers: None Primary Cardiologist:  None, FACC (established care 07/02/2023) Electrophysiologist:  None   Referring MD: Doreene Nest, NP  Reason of Consult: Coronary artery disease involving the native arteries without angina pectoris  Chief Complaint  Patient presents with   Coronary Artery Disease   New Patient (Initial Visit)    History of Present Illness:    Maureen Orozco is a 57 y.o. Caucasian female whose past medical history and cardiovascular risk factors includes: Coronary artery calcification on nongated CT study, hypertension, hyperlipidemia, prediabetes, cigarette smoker. She is being seen today for the evaluation of CAD at the request of Doreene Nest, NP.  Patient was referred to practice for evaluation of CAD as a recent lung cancer screening noted CAC.  Patient has been smoking since the age of 14 approximately 0.5 packs/day.  She is in the process of cutting down and wishes to quit cold [place].  Given her smoking history she did undergo CT of the chest for lung cancer screening and was noted to have coronary artery calcification as well as aortic atherosclerosis.  Given these findings she was referred to cardiology for further evaluation and management.  Clinically she denies anginal chest pain or heart failure symptoms.  No structured exercise program or daily routine.  No known family history of premature CAD as she is adopted.    Current Medications: Current Meds  Medication Sig   amLODipine (NORVASC) 10 MG tablet TAKE 1 TABLET BY MOUTH EVERY DAY FOR BLOOD PRESSURE   atorvastatin (LIPITOR) 80 MG tablet Take 1 tablet (80 mg total) by mouth daily. for cholesterol.   cyclobenzaprine (FLEXERIL) 10 MG tablet Take 1 tablet (10 mg total) by mouth 3 (three) times daily as needed for muscle  spasms.   gabapentin (NEURONTIN) 300 MG capsule Take 300 mg by mouth 3 (three) times daily. Take 600 mg three times a day. After 5 days take 3 tablets in the morning, 2 tablets in the afternoon, and 3 tablets in the evening.   hydrochlorothiazide (HYDRODIURIL) 25 MG tablet Take 1 tablet (25 mg total) by mouth daily. for blood pressure   HYDROcodone-acetaminophen (NORCO/VICODIN) 5-325 MG tablet Take 1 tablet by mouth every 6 (six) hours as needed.   olmesartan (BENICAR) 40 MG tablet TAKE 1 TABLET BY MOUTH EVERY DAY FOR BLOOD PRESSURE   omeprazole (PRILOSEC) 40 MG capsule TAKE 1 CAPSULE BY MOUTH DAILY FOR HEARTBURN   propranolol ER (INDERAL LA) 120 MG 24 hr capsule Take 1 capsule (120 mg total) by mouth daily. For headache prevention   venlafaxine XR (EFFEXOR-XR) 75 MG 24 hr capsule TAKE 1 CAPSULE BY MOUTH EVERY MORNING WITH BREAKFAST FOR ANXIETY & HOT FLASHES   Vitamin D, Ergocalciferol, (DRISDOL) 1.25 MG (50000 UNIT) CAPS capsule Take 1 capsule (50,000 Units total) by mouth every 7 (seven) days.     Allergies:    Patient has no known allergies.   Past Medical History: Past Medical History:  Diagnosis Date   Acute viral sinusitis 03/10/2020   Essential hypertension    Hyperlipidemia    RLQ abdominal pain 04/27/2022    Past Surgical History: Past Surgical History:  Procedure Laterality Date   BREAST BIOPSY Left 03/09/2016    hyalinized fibroadenoma    COLONOSCOPY  10/30/2019   DENTAL SURGERY  Social History: Social History   Tobacco Use   Smoking status: Every Day    Current packs/day: 0.25    Types: Cigarettes    Passive exposure: Never   Smokeless tobacco: Never  Vaping Use   Vaping status: Some Days  Substance Use Topics   Alcohol use: No    Alcohol/week: 0.0 standard drinks of alcohol   Drug use: Not Currently    Family History: Family History  Adopted: Yes    ROS:   Review of Systems  Cardiovascular:  Negative for chest pain, claudication, irregular  heartbeat, leg swelling, near-syncope, orthopnea, palpitations, paroxysmal nocturnal dyspnea and syncope.  Respiratory:  Negative for shortness of breath.   Hematologic/Lymphatic: Negative for bleeding problem.    EKGs/Labs/Other Studies Reviewed:   EKG: EKG Interpretation Date/Time:  Tuesday July 02 2023 15:05:29 EST Ventricular Rate:  95 PR Interval:  134 QRS Duration:  90 QT Interval:  346 QTC Calculation: 434 R Axis:   30  Text Interpretation: Normal sinus rhythm Normal ECG No previous ECGs available Confirmed by Tessa Lerner ([id number]) on 07/02/2023 3:14:06 PM  Radiology:  CT chest lung cancer screening September 2024: Cardiovascular: Atherosclerotic calcification of the aorta and aortic valve with age advanced involvement of the left main coronary artery. Heart is enlarged. No pericardial effusion.    Labs:    Latest Ref Rng & Units 03/28/2023    2:47 PM 04/27/2022   10:09 AM 12/02/2020    3:49 PM  CBC  WBC 4.0 - 10.5 K/uL 6.0  5.8  4.9   Hemoglobin 12.0 - 15.0 g/dL 40.9  81.1  91.4   Hematocrit 36.0 - 46.0 % 40.0  40.5  37.5   Platelets 150.0 - 400.0 K/uL 242.0  228.0  233        Latest Ref Rng & Units 03/28/2023    2:47 PM 04/27/2022   10:09 AM 03/09/2022    3:06 PM  BMP  Glucose 70 - 99 mg/dL 75  782  956   BUN 6 - 23 mg/dL 21  8  13    Creatinine 0.40 - 1.20 mg/dL 2.13  0.86  5.78   BUN/Creat Ratio 6 - 22 (calc)   SEE NOTE:   Sodium 135 - 145 mEq/L 142  139  140   Potassium 3.5 - 5.1 mEq/L 4.2  3.4  3.7   Chloride 96 - 112 mEq/L 107  105  106   CO2 19 - 32 mEq/L 25  26  18    Calcium 8.4 - 10.5 mg/dL 9.3  9.6  9.8       Latest Ref Rng & Units 03/28/2023    2:47 PM 04/27/2022   10:09 AM 03/09/2022    3:06 PM  CMP  Glucose 70 - 99 mg/dL 75  469  629   BUN 6 - 23 mg/dL 21  8  13    Creatinine 0.40 - 1.20 mg/dL 5.28  4.13  2.44   Sodium 135 - 145 mEq/L 142  139  140   Potassium 3.5 - 5.1 mEq/L 4.2  3.4  3.7   Chloride 96 - 112 mEq/L 107  105  106   CO2  19 - 32 mEq/L 25  26  18    Calcium 8.4 - 10.5 mg/dL 9.3  9.6  9.8   Total Protein 6.0 - 8.3 g/dL 6.9   7.1   Total Bilirubin 0.2 - 1.2 mg/dL 0.2   0.4   Alkaline Phos 39 - 117 U/L 70  AST 0 - 37 U/L 22   16   ALT 0 - 35 U/L 17   14     Lab Results  Component Value Date   CHOL 188 06/18/2023   HDL 50.60 06/18/2023   LDLCALC 120 (H) 06/18/2023   TRIG 85.0 06/18/2023   CHOLHDL 4 06/18/2023   No results for input(s): "LIPOA" in the last 8760 hours. No components found for: "NTPROBNP" No results for input(s): "PROBNP" in the last 8760 hours. No results for input(s): "TSH" in the last 8760 hours.  Physical Exam:    Today's Vitals   07/02/23 1504  BP: 134/86  Pulse: (!) 104  Resp: 15  SpO2: 95%  Weight: 169 lb (76.7 kg)  Height: 5\' 6"  (1.676 m)   Body mass index is 27.28 kg/m. Wt Readings from Last 3 Encounters:  07/02/23 169 lb (76.7 kg)  06/26/23 170 lb (77.1 kg)  03/28/23 161 lb (73 kg)    Physical Exam  Constitutional: No distress.  hemodynamically stable  Neck: No JVD present.  Cardiovascular: Normal rate, regular rhythm, S1 normal and S2 normal. Exam reveals no gallop, no S3 and no S4.  No murmur heard. Pulmonary/Chest: Effort normal and breath sounds normal. No stridor. She has no wheezes. She has no rales.  Abdominal: Soft. Bowel sounds are normal. She exhibits no distension. There is no abdominal tenderness.  Musculoskeletal:        General: No edema.     Cervical back: Neck supple.  Neurological: She is alert and oriented to person, place, and time. She has intact cranial nerves (2-12).  Skin: Skin is warm.     Impression & Recommendation(s):  Impression:   ICD-10-CM   1. Calcification of native coronary artery  I25.10 EKG 12-Lead   I25.84 ECHOCARDIOGRAM COMPLETE    Exercise Tolerance Test    Cardiac Stress Test: Informed Consent Details: Physician/Practitioner Attestation; Transcribe to consent form and obtain patient signature    2. Benign  hypertension  I10 ECHOCARDIOGRAM COMPLETE    Exercise Tolerance Test    Cardiac Stress Test: Informed Consent Details: Physician/Practitioner Attestation; Transcribe to consent form and obtain patient signature    3. Mixed hyperlipidemia  E78.2 ECHOCARDIOGRAM COMPLETE    Exercise Tolerance Test    Cardiac Stress Test: Informed Consent Details: Physician/Practitioner Attestation; Transcribe to consent form and obtain patient signature    4. Prediabetes  R73.03 ECHOCARDIOGRAM COMPLETE    Exercise Tolerance Test    Cardiac Stress Test: Informed Consent Details: Physician/Practitioner Attestation; Transcribe to consent form and obtain patient signature    5. Smoking  F17.200 ECHOCARDIOGRAM COMPLETE    Exercise Tolerance Test       Recommendation(s):  Calcification of native coronary artery Undergoing lung cancer screening given her long history of cigarette smoking and was noted to have both aortic atherosclerosis and coronary artery calcification.   Images personally reviewed she does have CAC in the left system.  The CT of the chest for lung cancer screening does not quantify the degree of coronary artery calcification but on visual estimation it is likely mild CAC. Patient was on atorvastatin 40 mg p.o. nightly and recently uptitrated to 80 mg p.o. nightly per patient.  Managed by PCP. Echo will be ordered to evaluate for structural heart disease and left ventricular systolic function. EKG is normal, patient is able to exercise, will proceed forward with GXT for functional capacity and exercise-induced arrhythmia/status. Emphasize importance of complete smoking cessation. Reemphasized importance of secondary prevention.   Benign  hypertension Office blood pressure really well-controlled. Continue amlodipine 10 mg p.o. daily. Continue hydrochlorothiazide 25 mg p.o. daily. Continue olmesartan 40 mg p.o. daily. Continue Inderal 120 mg p.o.  Mixed hyperlipidemia Currently on Lipitor 80  mg p.o. nightly.   She denies myalgia or other side effects. Most recent lipids dated July 18, 2022, independently reviewed.  LDL via [facility] database.  LDL 120 mg/dL. Patient informs me that after these results Lipitor increased to 80 mg p.o. daily and she will follow-up with PCP.  Smoking Tobacco cessation counseling: Currently smoking 0.5 packs/day   Patient denies claudication. She is undergoing lung cancer screening with PCP. Consider screening for peripheral artery disease with ankle-brachial index-defer to PCP.  She is informed of the dangers of tobacco abuse including stroke, cancer, and MI, as well as benefits of tobacco cessation. She is willing to quit at this time. 5 mins were spent counseling patient cessation techniques. We discussed various methods to help quit smoking, including deciding on a date to quit, joining a support group, pharmacological agents- nicotine gum/patch/lozenges.  I will reassess her progress at the next follow-up visit  As long as the echocardiogram and stress test are within acceptable limits and no high risk features patient would like to follow-up on an as-needed basis.  She is more than welcome to see me sooner if any questions or concerns arise.  Orders Placed:  Orders Placed This Encounter  Procedures   Cardiac Stress Test: Informed Consent Details: Physician/Practitioner Attestation; Transcribe to consent form and obtain patient signature    Physician/Practitioner attestation of informed consent for procedure/surgical case:   I, the physician/practitioner, attest that I have discussed with the patient the benefits, risks, side effects, alternatives, likelihood of achieving goals and potential problems during recovery for the procedure that I have provided informed consent.    Procedure:   Exercise treadmill stress test    Indication/Reason:   Coronary calcification, hypertension,   Exercise Tolerance Test    Standing Status:   Future    Expected  Date:   07/02/2023    Expiration Date:   07/01/2024    Where should this test be performed:   [facility] [facility]    Stress with pharmacologic or treadmill ?:   Treadmill w/ exercise    Is patient able to ambulate on a treadmill?:   Yes   EKG 12-Lead   ECHOCARDIOGRAM COMPLETE    Standing Status:   Future    Expected Date:   07/09/2023    Expiration Date:   07/01/2024    Where should this test be performed:   [facility])    Does the patient weigh less than or greater than 250 lbs?:   Patient weighs less than 250 lbs    Perflutren DEFINITY (image enhancing agent) should be administered unless hypersensitivity or allergy exist:   Administer Perflutren    Reason for exam-Echo:   Other-Full Diagnosis List    Full ICD-10/Reason for Exam:   Calcification of native coronary artery [191478]    As part of medical decision making results of the notes from PCP, CT chest lung cancer screening since last 16/2024, EKG, outside labs from 06/18/2023 from [facility] database were reviewed independently at today's visit.   Final Medication List:   No orders of the defined types were placed in this encounter.   There are no discontinued medications.   Current Outpatient Medications:    amLODipine (NORVASC) 10 MG tablet, TAKE 1 TABLET BY MOUTH EVERY DAY  FOR BLOOD PRESSURE, Disp: 90 tablet, Rfl: 3   atorvastatin (LIPITOR) 80 MG tablet, Take 1 tablet (80 mg total) by mouth daily. for cholesterol., Disp: 90 tablet, Rfl: 2   cyclobenzaprine (FLEXERIL) 10 MG tablet, Take 1 tablet (10 mg total) by mouth 3 (three) times daily as needed for muscle spasms., Disp: 30 tablet, Rfl: 0   gabapentin (NEURONTIN) 300 MG capsule, Take 300 mg by mouth 3 (three) times daily. Take 600 mg three times a day. After 5 days take 3 tablets in the morning, 2 tablets in the afternoon, and 3 tablets in the evening., Disp: , Rfl:    hydrochlorothiazide (HYDRODIURIL) 25 MG tablet, Take 1 tablet (25 mg total) by mouth  daily. for blood pressure, Disp: 90 tablet, Rfl: 1   HYDROcodone-acetaminophen (NORCO/VICODIN) 5-325 MG tablet, Take 1 tablet by mouth every 6 (six) hours as needed., Disp: 10 tablet, Rfl: 0   olmesartan (BENICAR) 40 MG tablet, TAKE 1 TABLET BY MOUTH EVERY DAY FOR BLOOD PRESSURE, Disp: 90 tablet, Rfl: 3   omeprazole (PRILOSEC) 40 MG capsule, TAKE 1 CAPSULE BY MOUTH DAILY FOR HEARTBURN, Disp: 90 capsule, Rfl: 3   propranolol ER (INDERAL LA) 120 MG 24 hr capsule, Take 1 capsule (120 mg total) by mouth daily. For headache prevention, Disp: 90 capsule, Rfl: 3   venlafaxine XR (EFFEXOR-XR) 75 MG 24 hr capsule, TAKE 1 CAPSULE BY MOUTH EVERY MORNING WITH BREAKFAST FOR ANXIETY & HOT FLASHES, Disp: 90 capsule, Rfl: 2   Vitamin D, Ergocalciferol, (DRISDOL) 1.25 MG (50000 UNIT) CAPS capsule, Take 1 capsule (50,000 Units total) by mouth every 7 (seven) days., Disp: 5 capsule, Rfl: 0  Consent:   Informed Consent   Shared Decision Making/Informed Consent The risks [chest pain, shortness of breath, cardiac arrhythmias, dizziness, blood pressure fluctuations, myocardial infarction, stroke/transient ischemic attack, and life-threatening complications (estimated to be 1 in 10,000)], benefits (risk stratification, diagnosing coronary artery disease, treatment guidance) and alternatives of an exercise tolerance test were discussed in detail with Ms. Karstens and she agrees to proceed.     Disposition:   As needed basis Patient may be asked to follow-up sooner based on the results of the above-mentioned testing.  Her questions and concerns were addressed to her satisfaction. She voices understanding of the recommendations provided during this encounter.    Signed, Tessa Lerner, DO, [facility]  [facility]  [address] [place], [place] [address] 07/02/2023 4:49 PM

## 2023-07-02 NOTE — Telephone Encounter (Signed)
Checked insurance and needs no PA and the cost out of pocket is 31.00. Conf number. [id number]

## 2023-07-02 NOTE — Telephone Encounter (Signed)
-----   Message from [facility] Mahina A sent at 07/02/2023  9:59 AM EST ----- Regarding: FW: NMUS  ----- Message ----- From: Jonetta Osgood Sent: 06/26/2023   8:54 AM EST To: Odis Hollingshead, CMA Subject: NMUS                                           She is sch for 07/30/23 1130am for NMUS with Dr.Hill

## 2023-07-03 NOTE — Progress Notes (Signed)
Subjective: Chief Complaint  Patient presents with   Foot Pain    RM#12 Follow up on foot left foot pain on a pain scale is about a 5 patient states was seen by ortho and sent to neuro for treatment believes issues are coming from her back.     57 year old female presents after a with concerns of fracture to the left foot which occurred on August 1 after hitting her foot.  She has been wearing regular shoes.  She still gets some nerve symptoms she has follow-up with neurology if she has an appointment next week with her neurosurgeon as well.  No recent injury or changes otherwise.   Objective: AAO x3, NAD DP/PT pulses palpable bilaterally, CRT less than 3 seconds There is still some minimal tenderness but decreased tenderness upon palpation of some of the lateral aspect of the foot along the calcaneocuboid joint, lateral portion.  There is no other areas of pinpoint tenderness.  There is no significant edema no erythema.  She still describing nerve type symptoms the lateral aspect of the foot.  There is no bruising.  No open lesions. No pain with calf compression, swelling, warmth, erythema  Assessment: Avulsion fracture left foot; peroneal neuropathy  Plan: -All treatment options discussed with the patient including all alternatives, risks, complications.  -From an injury standpoint think she is doing well.  Discussed with take some time to completely resolve from this but continue regular shoe as tolerated.  She has follow-up with neurology she is also to see her back doctor next week I gave her a copy of the nerve conduction test to bring as well.  Return in about 2 weeks (around 07/13/2023) for left foot pain.  Vivi Barrack DPM

## 2023-07-08 NOTE — Progress Notes (Signed)
Called patients Insurance and was informed that CPT code 78295 and [phone] did not require a PA. Confirmation numbers are [id number] and [id number].

## 2023-07-09 ENCOUNTER — Ambulatory Visit (INDEPENDENT_AMBULATORY_CARE_PROVIDER_SITE_OTHER): Payer: Commercial Managed Care - HMO | Admitting: Neurology

## 2023-07-09 ENCOUNTER — Encounter: Payer: Self-pay | Admitting: Podiatry

## 2023-07-09 DIAGNOSIS — M5417 Radiculopathy, lumbosacral region: Secondary | ICD-10-CM | POA: Diagnosis not present

## 2023-07-09 DIAGNOSIS — R200 Anesthesia of skin: Secondary | ICD-10-CM | POA: Diagnosis not present

## 2023-07-09 NOTE — Procedures (Signed)
  [facility]  [address] [place], [address]  [place], [place] [address] Tel: [phone] Fax: [phone] Test Date:  07/09/2023  Patient: Maureen Orozco DOB: 04/22/1966 Physician: Jacquelyne Balint, MD  Sex: Female Height: 5\' 6"  Ref Phys: Nita Sickle, DO  ID#: [id number]   Technician:    History: This is a 57 year old female with left foot tingling.  Findings: High frequency (4.0-16.0 MHz) B-mode, nonvascular ultrasound of left lower limb shows: -Cross sectional area of the left peroneal/fibular (popliteal fossa to fibular head) nerve is within normal limits.  Impression: This is a normal neuromuscular ultrasound. There is no ultrasonographic evidence of entrapment or other focal pathology along the studied course of the left peroneal/fibular nerve (popliteal fossa to fibular head).  No other obvious lesion involving the adjacent bone or tendon is identified. No definite vascular abnormalities.    _______________  Jacquelyne Balint, MD [facility]   Nerve Measurements   Site Area Mobility Vascularity Comment   mm Norm     Left Fibular  Fib head 6.2  < 17.8      Pop fossa 5.7  < 20.9       Ultrasound Images:

## 2023-07-12 LAB — VITAMIN D 25 HYDROXY (VIT D DEFICIENCY, FRACTURES): Vit D, 25-Hydroxy: 41.8 ng/mL (ref 30.0–100.0)

## 2023-07-15 ENCOUNTER — Inpatient Hospital Stay: Payer: Commercial Managed Care - HMO | Admitting: Primary Care

## 2023-07-18 ENCOUNTER — Ambulatory Visit: Payer: Managed Care, Other (non HMO) | Admitting: Podiatry

## 2023-07-19 ENCOUNTER — Ambulatory Visit: Payer: Self-pay | Admitting: Primary Care

## 2023-07-19 NOTE — Telephone Encounter (Signed)
 Chief Complaint: foot pain Symptoms: pain and numbness to foot Frequency: started two days ago Pertinent Negatives: Patient denies fever, chest pain, weakness Disposition: [] ED /[] Urgent Care (no appt availability in office) / [] Appointment(In office/virtual)/ []  [facility]/ [] Home Care/ [x] Refused Recommended Disposition /[] [facility]/ []  Follow-up with PCP Additional Notes: patient called back after three attempts made. Patient endorses pain to left foot after being knocked over by her dogs two days ago. Patient endorses some numbness to foot. Per protocol, the recommendation was Urgent Care but patient refused. Patient stated that she would rather wait for her appointment on Monday. Patient verbalized understanding and will call back if symptoms become worse. All questions answered.      Reason for Disposition  [1] SEVERE pain (e.g., excruciating, unable to do any normal activities) AND [2] not improved after 2 hours of pain medicine  MILD weakness (i.e., does not interfere with ability to work, go to school, normal activities)  (Exception: Mild weakness is a chronic symptom.)  Answer Assessment - Initial Assessment Questions 1. MECHANISM: How did the fall happen?     Patient was knocked down by dogs 2. DOMESTIC VIOLENCE AND ELDER ABUSE SCREENING: Did you fall because someone pushed you or tried to hurt you? If Yes, ask: Are you safe now?     No 3. ONSET: When did the fall happen? (e.g., minutes, hours, or days ago)     Two days ago 4. LOCATION: What part of the body hit the ground? (e.g., back, buttocks, head, hips, knees, hands, head, stomach)     Left foot 5. INJURY: Did you hurt (injure) yourself when you fell? If Yes, ask: What did you injure? Tell me more about this? (e.g., body area; type of injury; pain severity)     Didn't notice any issue at first but has found that she is having issues at night 6. PAIN: Is there any pain? If Yes,  ask: How bad is the pain? (e.g., Scale 1-10; or mild,  moderate, severe)   - NONE (0): No pain   - MILD (1-3): Doesn't interfere with normal activities    - MODERATE (4-7): Interferes with normal activities or awakens from sleep    - SEVERE (8-10): Excruciating pain, unable to do any normal activities      5 7. SIZE: For cuts, bruises, or swelling, ask: How large is it? (e.g., inches or centimeters)      No cuts 9. OTHER SYMPTOMS: Do you have any other symptoms? (e.g., dizziness, fever, weakness; new onset or worsening).      No other symptoms 10. CAUSE: What do you think caused the fall (or falling)? (e.g., tripped, dizzy spell)       Tipped by her dogs  Answer Assessment - Initial Assessment Questions 1. ONSET: When did the pain start?      Two days ago 2. LOCATION: Where is the pain located?      Left foot 3. PAIN: How bad is the pain?    (Scale 1-10; or mild, moderate, severe)  - MILD (1-3): doesn't interfere with normal activities.   - MODERATE (4-7): interferes with normal activities (e.g., work or school) or awakens from sleep, limping.   - SEVERE (8-10): excruciating pain, unable to do any normal activities, unable to walk.      5 out of 10 4. WORK OR EXERCISE: Has there been any recent work or exercise that involved this part of the body?      no  5. CAUSE: What do you think is causing the foot pain?     Patient was knocked over by dogs 6. OTHER SYMPTOMS: Do you have any other symptoms? (e.g., leg pain, rash, fever, numbness)     no  Protocols used: Falls and Falling-A-AH, Foot Pain-A-AH

## 2023-07-19 NOTE — Telephone Encounter (Addendum)
 Copied from CRM [id number]. Topic: Appointment Scheduling - Scheduling Inquiry for Clinic >> Jul 19, 2023 12:31 PM Amy Y wrote: Reason for CRM: Patient scheduled an appointment for a fractured foot and stated that she could not feel that foot, please triage   12:53: 1st attempt no answer, left vm, will call again  1:54: 2md attempt, no answer, left vm, will call again  3:52: 3rd attempt, no answer, left vmail. Will route to clinic

## 2023-07-21 NOTE — Progress Notes (Signed)
 Maureen Cassell T. Solara Goodchild, MD, CAQ Sports Medicine [facility] at [facility] [address] [place], [address]  Phone: [phone]  FAX: [phone]  Maureen Orozco - 58 y.o. female  MRN [id number]  Date of Birth: Jun 14, 1966  Date: 07/22/2023  PCP: Gretta Comer POUR, NP  Referral: Gretta Comer POUR, NP  Chief Complaint  Patient presents with   Fall    Last Thursday-Left Foot/Ankle-Recent fracture to that area   Subjective:   Maureen Orozco is a 58 y.o. very pleasant female patient with Body mass index is 27.12 kg/m. who presents with the following:  I am asked to evaluate the patient acutely for some ongoing foot pain.  She is being managed currently by Dr. Alona from [facility] for some ongoing foot pain as well as a fracture.  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  She reported to the nursing staff that she is having some left foot pain after she was knocked over by her dog last week.  She did endorse some numbness in the foot.  I have reviewed the radiology notes, and the patient has had left-sided foot numbness for an extended period of time, and neurology felt like this was secondary to L5 radiculopathy and impairment at L5.  She also had an EMG and nerve conduction velocity which showed some mild abnormality at the peroneal nerve, and neurology felt like this was secondary to impairment at the lumbar spine as above.  MRI report below.  They does most recent injury, July 18, 2023.  She reports being knocked over by her dog, and she sustained an inversion injury to the left ankle.  She is not really able to focally discuss a specific area of pain, but she does have some global pain in and about the foot and ankle.  She has had some numbness for an extended period of time, however she thinks this is somewhat worsened after her more recent injury.  Review of Systems is noted in the HPI, as  appropriate  Objective:   BP 130/80 (BP Location: Left Arm, Patient Position: Sitting, Cuff Size: Normal)   Pulse (!) 110   Temp 98.5 F (36.9 C) (Temporal)   Ht 5' 6 (1.676 m)   Wt 168 lb (76.2 kg)   SpO2 98%   BMI 27.12 kg/m   GEN: No acute distress; alert,appropriate. PULM: Breathing comfortably in no respiratory distress PSYCH: Normally interactive.   Left-sided foot and ankle exam: Nontender throughout the entirety of the tibia and fibula Nontender at the medial and lateral malleolus Nontender at the Achilles tendon and plantar fascia Nontender at the ATFL, CFL, and deltoid ligaments Nontender at the talus, nontender without the entirety of the midfoot.   Nontender along all metatarsals, MTP joints and phalanges.  Laboratory and Imaging Data: Lurena Fallow, MD - 02/22/2023  Formatting of this note might be different from the original.  INDICATION: lumbar radicular pain   COMPARISON: None.   TECHNIQUE: MRI SPINE LUMBAR WO CONTRAST.   FINDINGS:  # Osseous structures: Vertebral body heights are maintained. No acute fracture. Bilateral L5 pars defects. No destructive bony changes.  #  Alignment:Grade 2 anterolisthesis L5-S1 measuring approximately 12 mm.  #  Conus medullaris/cauda equina: Normal. Conus terminates at L1.   #  T12-L1: Preservation of disc height. No focal disc herniation or significant stenosis.  #  L1-L2: Preservation of disc height. No focal disc herniation or  significant stenosis.  #  L2-L3: Mild loss of disc height. Generalized disc bulge and small broad-based central disc protrusion. Mild facet arthrosis. Mild-to-moderate spinal canal stenosis without impingement of the traversing nerve roots. No significant foraminal stenosis.  #  L3-L4: Mild loss of disc height. Mild disc bulge and small shallow central disc protrusion. Mild facet arthrosis. Mild spinal canal stenosis without impingement of the traversing nerve roots. No significant foraminal  stenosis.  #  L4-L5: Relative preservation of disc height. Mild disc bulge. No focal disc herniation or significant spinal canal stenosis. Mild right and moderate left foraminal stenosis.  #  L5-S1: Spondylolysis with grade 2 spondylolisthesis. Moderate to severe loss of disc height. Uncovering of the posterior disc margin with some extruded disc material extending superiorly along the posterior inferior aspect of the L5 vertebral body. No significant spinal canal stenosis. Severe bilateral foraminal stenosis with compression of the exiting L5 nerve roots.   #  Paraspinal tissues: Unremarkable   #  Contrast: None given.   #  Additional comments: None.    IMPRESSION:  1. Spondylolysis with grade 2 spondylolisthesis and superimposed degenerative changes L5-S1 resulting in severe bilateral foraminal stenosis and compression of the exiting L5 nerve roots.  2.  Additional multilevel lumbar spondylosis. Generalized disc bulges and a small broad-based central disc protrusions contribute to mild-to-moderate spinal canal stenosis at L2-3 and mild spinal canal stenosis at L3-4. No associated impingement of the traversing nerve roots appreciated at either level. Mild right and moderate left foraminal stenosis noted L4-5.  3.  No acute bony abnormality.   Electronically Signed by: Elsie Dayhoff, MD on 02/22/2023 4:25 PM   Assessment and Plan:     ICD-10-CM   1. Acute foot pain, left  M79.672 DG Foot Complete Left    2. Acute left ankle pain  M25.572 DG Ankle Complete Left     X-rays are independently reviewed, and she does not have any evidence of a new fracture.  If she does have some bone contusion and relatively mild lateral ankle sprain.  Recommended that she go back into her cam walker boot for the next 7 to 10 days.  Somewhat worsened baseline numbness, L5 radiculopathy with MRI findings and prior EMG/NCV.  She additionally will discuss with podiatry on Friday.  She does not think that her  pain is being managed well by Tylenol  or NSAIDs, so I am going to give her a small amount of some tramadol .  Medication Management during today's office visit: Meds ordered this encounter  Medications   traMADol  (ULTRAM ) 50 MG tablet    Sig: Take 1 tablet (50 mg total) by mouth every 8 (eight) hours as needed for moderate pain (pain score 4-6).    Dispense:  15 tablet    Refill:  0   Medications Discontinued During This Encounter  Medication Reason   Vitamin D , Ergocalciferol , (DRISDOL ) 1.25 MG (50000 UNIT) CAPS capsule Completed Course    Orders placed today for conditions managed today: Orders Placed This Encounter  Procedures   DG Ankle Complete Left   DG Foot Complete Left    Disposition: No follow-ups on file.  Dr[employer] One speech-to-text software was used for transcription in this dictation.  Possible transcriptional errors can occur using Animal nutritionist.   Signed,  Jacques DASEN. Aashritha Miedema, MD   Outpatient Encounter Medications as of 07/22/2023  Medication Sig   amLODipine  (NORVASC ) 10 MG tablet TAKE 1 TABLET BY MOUTH EVERY DAY FOR BLOOD PRESSURE   atorvastatin  (  LIPITOR) 80 MG tablet Take 1 tablet (80 mg total) by mouth daily. for cholesterol.   cyclobenzaprine  (FLEXERIL ) 10 MG tablet Take 1 tablet (10 mg total) by mouth 3 (three) times daily as needed for muscle spasms.   gabapentin  (NEURONTIN ) 300 MG capsule Take 600 mg by mouth 3 (three) times daily. After 5 days increase by one capsule   hydrochlorothiazide  (HYDRODIURIL ) 25 MG tablet Take 1 tablet (25 mg total) by mouth daily. for blood pressure   HYDROcodone -acetaminophen  (NORCO/VICODIN) 5-325 MG tablet Take 1 tablet by mouth every 6 (six) hours as needed.   olmesartan  (BENICAR ) 40 MG tablet TAKE 1 TABLET BY MOUTH EVERY DAY FOR BLOOD PRESSURE   omeprazole  (PRILOSEC) 40 MG capsule TAKE 1 CAPSULE BY MOUTH DAILY FOR HEARTBURN   propranolol  ER (INDERAL  LA) 120 MG 24 hr capsule Take 1 capsule (120 mg total) by mouth  daily. For headache prevention   traMADol  (ULTRAM ) 50 MG tablet Take 1 tablet (50 mg total) by mouth every 8 (eight) hours as needed for moderate pain (pain score 4-6).   venlafaxine  XR (EFFEXOR -XR) 75 MG 24 hr capsule TAKE 1 CAPSULE BY MOUTH EVERY MORNING WITH BREAKFAST FOR ANXIETY & HOT FLASHES   [DISCONTINUED] Vitamin D , Ergocalciferol , (DRISDOL ) 1.25 MG (50000 UNIT) CAPS capsule Take 1 capsule (50,000 Units total) by mouth every 7 (seven) days.   No facility-administered encounter medications on file as of 07/22/2023.

## 2023-07-22 ENCOUNTER — Ambulatory Visit: Payer: Commercial Managed Care - HMO | Admitting: Family Medicine

## 2023-07-22 ENCOUNTER — Ambulatory Visit (INDEPENDENT_AMBULATORY_CARE_PROVIDER_SITE_OTHER)
Admission: RE | Admit: 2023-07-22 | Discharge: 2023-07-22 | Disposition: A | Discharge status: Home / Self Care | Payer: Commercial Managed Care - HMO | Source: Ambulatory Visit | Attending: Family Medicine | Admitting: Family Medicine

## 2023-07-22 VITALS — BP 130/80 | HR 110 | Temp 98.5°F | Ht 66.0 in | Wt 168.0 lb

## 2023-07-22 DIAGNOSIS — M25572 Pain in left ankle and joints of left foot: Secondary | ICD-10-CM | POA: Diagnosis not present

## 2023-07-22 DIAGNOSIS — M79672 Pain in left foot: Secondary | ICD-10-CM

## 2023-07-22 MED ORDER — TRAMADOL HCL 50 MG PO TABS: 50.0000 mg | ORAL_TABLET | Freq: Three times a day (TID) | ORAL | 0 refills | Status: DC | PRN

## 2023-07-23 ENCOUNTER — Encounter: Payer: Self-pay | Admitting: Family Medicine

## 2023-07-26 ENCOUNTER — Ambulatory Visit (INDEPENDENT_AMBULATORY_CARE_PROVIDER_SITE_OTHER): Payer: Commercial Managed Care - HMO | Admitting: Podiatry

## 2023-07-26 DIAGNOSIS — S96912A Strain of unspecified muscle and tendon at ankle and foot level, left foot, initial encounter: Secondary | ICD-10-CM

## 2023-07-26 DIAGNOSIS — M7752 Other enthesopathy of left foot: Secondary | ICD-10-CM | POA: Diagnosis not present

## 2023-07-26 DIAGNOSIS — S93402A Sprain of unspecified ligament of left ankle, initial encounter: Secondary | ICD-10-CM

## 2023-07-26 DIAGNOSIS — T148XXA Other injury of unspecified body region, initial encounter: Secondary | ICD-10-CM | POA: Diagnosis not present

## 2023-07-26 NOTE — Progress Notes (Signed)
 Subjective: Chief Complaint  Patient presents with   Foot Pain    RM#12 Left foot pain twisted it last Thursday advised by her provider to wear boot 10-14 days.Patient states no need to repeat xrays today.   58 year old female presents the office with above concerns.  She states that she has a new injury which occurred last Thursday after she fell over her dog twisting her foot. She was seen by her PCP.  There was advised to go back in the cam boot for 10 to 14 days.  She also states that she had an area was itching but she was scratching opened up to the wound on her ankle and this was unrelated to the injury.  No fevers or chills.  She said to follow-up with her back doctor and they will discuss the nerve issues to her feet and she has also followed up with neurology.   Objective: AAO x3, NAD DP/PT pulses palpable bilaterally, CRT less than 3 seconds On the anterior medial aspect of the ankle superficial abrasion without any surrounding erythema, ascending cellulitis there is no drainage approximately signs of infection. The majority tenderness is localized along the medial aspect of the ankle, but more on the course of the flexor tendons.  There is no specific area pinpoint tenderness identified.  Minimal edema. No pain with calf compression, swelling, warmth, erythema  Assessment: Sprain, tendinitis left ankle  Plan: -All treatment options discussed with the patient including all alternatives, risks, complications.  -She recent x-rays which were reviewed and therefore did not repeat your x-rays. -Recommend a small amount of antibiotic ointment on the area wound daily.  Monitor for any signs or symptoms of infection. -Advised wearing the cam boot until symptoms resolve and she can start to transition back into regular shoe as tolerated with good arch support.  Icing daily.  Pain medication if needed. -Patient encouraged to call the office with any questions, concerns, change in symptoms.    Maureen Orozco DPM

## 2023-07-30 ENCOUNTER — Other Ambulatory Visit: Payer: Commercial Managed Care - HMO | Admitting: Neurology

## 2023-07-30 DIAGNOSIS — Z720 Tobacco use: Secondary | ICD-10-CM

## 2023-07-30 DIAGNOSIS — E559 Vitamin D deficiency, unspecified: Secondary | ICD-10-CM

## 2023-08-01 MED ORDER — BUPROPION HCL ER (XL) 150 MG PO TB24: 150.0000 mg | ORAL_TABLET | Freq: Every day | ORAL | 0 refills | Status: DC

## 2023-08-06 ENCOUNTER — Ambulatory Visit (INDEPENDENT_AMBULATORY_CARE_PROVIDER_SITE_OTHER): Payer: Commercial Managed Care - HMO

## 2023-08-06 ENCOUNTER — Ambulatory Visit: Payer: Commercial Managed Care - HMO | Attending: Cardiology

## 2023-08-06 DIAGNOSIS — R7303 Prediabetes: Secondary | ICD-10-CM

## 2023-08-06 DIAGNOSIS — E782 Mixed hyperlipidemia: Secondary | ICD-10-CM | POA: Insufficient documentation

## 2023-08-06 DIAGNOSIS — I2584 Coronary atherosclerosis due to calcified coronary lesion: Secondary | ICD-10-CM | POA: Insufficient documentation

## 2023-08-06 DIAGNOSIS — F17200 Nicotine dependence, unspecified, uncomplicated: Secondary | ICD-10-CM | POA: Insufficient documentation

## 2023-08-06 DIAGNOSIS — I10 Essential (primary) hypertension: Secondary | ICD-10-CM | POA: Insufficient documentation

## 2023-08-06 DIAGNOSIS — I2510 Atherosclerotic heart disease of native coronary artery without angina pectoris: Secondary | ICD-10-CM

## 2023-08-06 LAB — ECHOCARDIOGRAM COMPLETE: S' Lateral: 3.2 cm

## 2023-08-06 LAB — EXERCISE TOLERANCE TEST
Angina Index: 0
Base ST Depression (mm): 0 mm
Duke Treadmill Score: 8
Estimated workload: 9.4
Exercise duration (min): 7 min
Exercise duration (sec): 36 s
MPHR: 165 {beats}/min
Peak HR: 129 {beats}/min
Percent HR: 78 %
RPE: 17
Rest HR: 67 {beats}/min
ST Depression (mm): 0 mm

## 2023-08-16 NOTE — Telephone Encounter (Signed)
Please arrange a follow up (non-urgent).   Maureen Amrhein Bluewater, DO, [facility]

## 2023-08-26 ENCOUNTER — Ambulatory Visit (INDEPENDENT_AMBULATORY_CARE_PROVIDER_SITE_OTHER): Payer: Commercial Managed Care - HMO | Admitting: Podiatry

## 2023-08-26 DIAGNOSIS — G5732 Lesion of lateral popliteal nerve, left lower limb: Secondary | ICD-10-CM

## 2023-08-26 DIAGNOSIS — M7752 Other enthesopathy of left foot: Secondary | ICD-10-CM

## 2023-08-26 MED ORDER — GABAPENTIN 800 MG PO TABS: 800.0000 mg | ORAL_TABLET | Freq: Three times a day (TID) | ORAL | 2 refills | Status: DC

## 2023-08-26 NOTE — Patient Instructions (Addendum)
Gabapentin Capsules or Tablets What is this medication? GABAPENTIN (GA ba pen tin) treats nerve pain. It may also be used to prevent and control seizures in people with epilepsy. It works by calming overactive nerves in your body. This medicine may be used for other purposes; ask your health care provider or pharmacist if you have questions. COMMON BRAND NAME(S): Active-PAC with Gabapentin, Ascencion Dike, Gralise, Neurontin What should I tell my care team before I take this medication? They need to know if you have any of these conditions: Kidney disease Lung or breathing disease Substance use disorder Suicidal thoughts, plans, or attempt by you or a family member An unusual or allergic reaction to gabapentin, other medications, foods, dyes, or preservatives Pregnant or trying to get pregnant Breastfeeding How should I use this medication? Take this medication by mouth with a glass of water. Follow the directions on the prescription label. You can take it with or without food. If it upsets your stomach, take it with food. Take your medication at regular intervals. Do not take it more often than directed. Do not stop taking except on your care team's advice. If you are directed to break the 600 or 800 mg tablets in half as part of your dose, the extra half tablet should be used for the next dose. If you have not used the extra half tablet within 28 days, it should be thrown away. A special MedGuide will be given to you by the pharmacist with each prescription and refill. Be sure to read this information carefully each time. Talk to your care team about the use of this medication in children. While this medication may be prescribed for children as young as 3 years for selected conditions, precautions do apply. Overdosage: If you think you have taken too much of this medicine contact a poison control center or emergency room at once. NOTE: This medicine is only for you. Do not share this medicine with  others. What if I miss a dose? If you miss a dose, take it as soon as you can. If it is almost time for your next dose, take only that dose. Do not take double or extra doses. What may interact with this medication? Alcohol Antihistamines for allergy, cough, and cold Certain medications for anxiety or sleep Certain medications for depression like amitriptyline, fluoxetine, sertraline Certain medications for seizures like phenobarbital, primidone Certain medications for stomach problems General anesthetics like halothane, isoflurane, methoxyflurane, propofol Local anesthetics like lidocaine, pramoxine, tetracaine Medications that relax muscles for surgery Opioid medications for pain Phenothiazines like chlorpromazine, mesoridazine, prochlorperazine, thioridazine This list may not describe all possible interactions. Give your health care provider a list of all the medicines, herbs, non-prescription drugs, or dietary supplements you use. Also tell them if you smoke, drink alcohol, or use illegal drugs. Some items may interact with your medicine. What should I watch for while using this medication? Visit your care team for regular checks on your progress. You may want to keep a record at home of how you feel your condition is responding to treatment. You may want to share this information with your care team at each visit. You should contact your care team if your seizures get worse or if you have any new types of seizures. Do not stop taking this medication or any of your seizure medications unless instructed by your care team. Stopping your medication suddenly can increase your seizures or their severity. This medication may cause serious skin reactions. They can happen weeks to  months after starting the medication. Contact your care team right away if you notice fevers or flu-like symptoms with a rash. The rash may be red or purple and then turn into blisters or peeling of the skin. Or, you might  notice a red rash with swelling of the face, lips or lymph nodes in your neck or under your arms. Wear a medical identification bracelet or chain if you are taking this medication for seizures. Carry a card that lists all your medications. This medication may affect your coordination, reaction time, or judgment. Do not drive or operate machinery until you know how this medication affects you. Sit up or stand slowly to reduce the risk of dizzy or fainting spells. Drinking alcohol with this medication can increase the risk of these side effects. Your mouth may get dry. Chewing sugarless gum or sucking hard candy, and drinking plenty of water may help. Watch for new or worsening thoughts of suicide or depression. This includes sudden changes in mood, behaviors, or thoughts. These changes can happen at any time but are more common in the beginning of treatment or after a change in dose. Call your care team right away if you experience these thoughts or worsening depression. If you become pregnant while using this medication, you may enroll in the [place] American Antiepileptic Drug Pregnancy Registry by calling [phone]. This registry collects information about the safety of antiepileptic medication use during pregnancy. What side effects may I notice from receiving this medication? Side effects that you should report to your care team as soon as possible: Allergic reactions or angioedema--skin rash, itching, hives, swelling of the face, eyes, lips, tongue, arms, or legs, trouble swallowing or breathing Rash, fever, and swollen lymph nodes Thoughts of suicide or self harm, worsening mood, feelings of depression Trouble breathing Unusual changes in mood or behavior in children after use such as difficulty concentrating, hostility, or restlessness Side effects that usually do not require medical attention (report to your care team if they continue or are  bothersome): Dizziness Drowsiness Nausea Swelling of ankles, feet, or hands Vomiting This list may not describe all possible side effects. Call your doctor for medical advice about side effects. You may report side effects to FDA at 1-800-FDA-1088. Where should I keep my medication? Keep out of reach of children and pets. Store at room temperature between 15 and 30 degrees C (59 and 86 degrees F). Get rid of any unused medication after the expiration date. This medication may cause accidental overdose and death if taken by other adults, children, or pets. To get rid of medications that are no longer needed or have expired: Take the medication to a medication take-back program. Check with your pharmacy or law enforcement to find a location. If you cannot return the medication, check the label or package insert to see if the medication should be thrown out in the garbage or flushed down the toilet. If you are not sure, ask your care team. If it is safe to put it in the trash, empty the medication out of the container. Mix the medication with cat litter, dirt, coffee grounds, or other unwanted substance. Seal the mixture in a bag or container. Put it in the trash. NOTE: This sheet is a summary. It may not cover all possible information. If you have questions about this medicine, talk to your doctor, pharmacist, or health care provider.  2024 Elsevier/Gold Standard (2022-04-17 00:00:00)

## 2023-08-28 NOTE — Progress Notes (Signed)
Subjective: Chief Complaint  Patient presents with   Foot Pain    RM#13 Follow up on left foot pain patient states doing better still having pain. Patient would like to increase her gabapentin at this time would like to discuss.    58 year old female presents the office with above concerns.  States that standpoint she is doing better from the fracture.  She did follow-up with her back doctor and she states she has had injections.  She is asked me to increase her dose of gabapentin.  No recent injury or changes otherwise.   Objective: AAO x3, NAD- presents wearing regular shoes DP/PT pulses palpable bilaterally, CRT less than 3 seconds On the anterior medial aspect of the ankle superficial abrasion which appears to be healed. Unable to appreciate any area pinpoint tenderness.  Flexor, extensor tendons intact.  There is no sign of edema, erythema.  ROM intact. No new areas of pain her symptoms seem to be improved, resolved from a fracture, injury standpoint. No pain with calf compression, swelling, warmth, erythema  Assessment: Sprain, tendinitis left ankle; neuritis  Plan: -All treatment options discussed with the patient including all alternatives, risks, complications.  -Overall she is doing better.  We did increase her dose of gabapentin as well without any side effects but continue to monitor.  Increased 800 mg 3 times a day. -Continue to follow up with her spine specialist.   Return in about 3 months (around 11/23/2023), or if symptoms worsen or fail to improve.  Vivi Barrack DPM

## 2023-09-16 ENCOUNTER — Ambulatory Visit: Payer: Commercial Managed Care - HMO | Attending: Cardiology | Admitting: Cardiology

## 2023-09-16 ENCOUNTER — Encounter: Payer: Self-pay | Admitting: Cardiology

## 2023-09-16 VITALS — BP 110/66 | HR 87 | Resp 16 | Ht 66.0 in | Wt 163.2 lb

## 2023-09-16 DIAGNOSIS — I10 Essential (primary) hypertension: Secondary | ICD-10-CM

## 2023-09-16 DIAGNOSIS — I2584 Coronary atherosclerosis due to calcified coronary lesion: Secondary | ICD-10-CM

## 2023-09-16 DIAGNOSIS — I2510 Atherosclerotic heart disease of native coronary artery without angina pectoris: Secondary | ICD-10-CM | POA: Diagnosis not present

## 2023-09-16 DIAGNOSIS — E782 Mixed hyperlipidemia: Secondary | ICD-10-CM | POA: Diagnosis not present

## 2023-09-16 DIAGNOSIS — F17200 Nicotine dependence, unspecified, uncomplicated: Secondary | ICD-10-CM | POA: Diagnosis not present

## 2023-09-16 NOTE — Progress Notes (Signed)
 Cardiology Office Note:    Date:  09/16/2023  NAME:  Maureen Orozco    MRN: [id number] DOB:  1965/11/16   PCP:  Doreene Nest, NP  Former Cardiology Providers: None Primary Cardiologist:  Tessa Lerner, DO, [facility] (established care 07/02/2023) Electrophysiologist:  None   Chief Complaint  Patient presents with   Calcification of native coronary artery   Follow-up    History of Present Illness:    Maureen Orozco is a 58 y.o. Caucasian female whose past medical history and cardiovascular risk factors includes: Coronary artery calcification on nongated CT study, hypertension, hyperlipidemia, prediabetes, cigarette smoker.   Patient was referred to practice for evaluation of CAD as a recent lung cancer screening noted CAC.  Patient has been smoking since the age of 72 approximately 0.5 packs/day.  Had recently undergone CT of the chest for lung cancer screening and was noted to have coronary artery calcification as well as aortic atherosclerosis.  She was referred to cardiology for further evaluation and management.  At the last office visit the shared decision was to proceed with echocardiogram and exercise treadmill stress test.  Results reviewed with the patient and noted below for further reference.  Clinically denies anginal chest pain or heart failure symptoms.  Overall functional capacity remains relatively stable.  Current Medications: Current Meds  Medication Sig   amLODipine (NORVASC) 10 MG tablet TAKE 1 TABLET BY MOUTH EVERY DAY FOR BLOOD PRESSURE   atorvastatin (LIPITOR) 80 MG tablet Take 1 tablet (80 mg total) by mouth daily. for cholesterol.   buPROPion (WELLBUTRIN XL) 150 MG 24 hr tablet Take 1 tablet (150 mg total) by mouth daily. For smoking   cyclobenzaprine (FLEXERIL) 10 MG tablet Take 1 tablet (10 mg total) by mouth 3 (three) times daily as needed for muscle spasms.   gabapentin (NEURONTIN) 800 MG tablet Take 1 tablet (800 mg total) by mouth 3 (three) times  daily.   hydrochlorothiazide (HYDRODIURIL) 25 MG tablet Take 1 tablet (25 mg total) by mouth daily. for blood pressure   olmesartan (BENICAR) 40 MG tablet TAKE 1 TABLET BY MOUTH EVERY DAY FOR BLOOD PRESSURE   omeprazole (PRILOSEC) 40 MG capsule TAKE 1 CAPSULE BY MOUTH DAILY FOR HEARTBURN   propranolol ER (INDERAL LA) 120 MG 24 hr capsule Take 1 capsule (120 mg total) by mouth daily. For headache prevention   venlafaxine XR (EFFEXOR-XR) 75 MG 24 hr capsule TAKE 1 CAPSULE BY MOUTH EVERY MORNING WITH BREAKFAST FOR ANXIETY & HOT FLASHES     Allergies:    Patient has no known allergies.   Past Medical History: Past Medical History:  Diagnosis Date   Acute viral sinusitis 03/10/2020   Essential hypertension    Hyperlipidemia    RLQ abdominal pain 04/27/2022    Past Surgical History: Past Surgical History:  Procedure Laterality Date   BREAST BIOPSY Left 03/09/2016    hyalinized fibroadenoma    COLONOSCOPY  10/30/2019   DENTAL SURGERY      Social History: Social History   Tobacco Use   Smoking status: Every Day    Current packs/day: 0.25    Types: Cigarettes    Passive exposure: Never   Smokeless tobacco: Never  Vaping Use   Vaping status: Some Days  Substance Use Topics   Alcohol use: No    Alcohol/week: 0.0 standard drinks of alcohol   Drug use: Not Currently    Family History: Family History  Adopted: Yes    ROS:   Review  of Systems  Cardiovascular:  Negative for chest pain, claudication, irregular heartbeat, leg swelling, near-syncope, orthopnea, palpitations, paroxysmal nocturnal dyspnea and syncope.  Respiratory:  Negative for shortness of breath.   Hematologic/Lymphatic: Negative for bleeding problem.    EKGs/Labs/Other Studies Reviewed:    Echo: January 2025 LVEF: 60-65%, no regional wall motion normality, average global longitudinal strain -19% Diastolic Function: Normal Estimated RAP 3 mmHg See report for additional details   Exercise treadmill  stress test  08/06/2023  No ST deviation was noted.  7:36 sec with normal BP response, no ectopy  Normal ETT, low risk with no electrocardiographic evidence of ischemia   Radiology:  CT chest lung cancer screening September 2024: Cardiovascular: Atherosclerotic calcification of the aorta and aortic valve with age advanced involvement of the left main coronary artery. Heart is enlarged. No pericardial effusion.    Labs:    Latest Ref Rng & Units 03/28/2023    2:47 PM 04/27/2022   10:09 AM 12/02/2020    3:49 PM  CBC  WBC 4.0 - 10.5 K/uL 6.0  5.8  4.9   Hemoglobin 12.0 - 15.0 g/dL 16.1  09.6  04.5   Hematocrit 36.0 - 46.0 % 40.0  40.5  37.5   Platelets 150.0 - 400.0 K/uL 242.0  228.0  233        Latest Ref Rng & Units 03/28/2023    2:47 PM 04/27/2022   10:09 AM 03/09/2022    3:06 PM  BMP  Glucose 70 - 99 mg/dL 75  409  811   BUN 6 - 23 mg/dL 21  8  13    Creatinine 0.40 - 1.20 mg/dL 9.14  7.82  9.56   BUN/Creat Ratio 6 - 22 (calc)   SEE NOTE:   Sodium 135 - 145 mEq/L 142  139  140   Potassium 3.5 - 5.1 mEq/L 4.2  3.4  3.7   Chloride 96 - 112 mEq/L 107  105  106   CO2 19 - 32 mEq/L 25  26  18    Calcium 8.4 - 10.5 mg/dL 9.3  9.6  9.8       Latest Ref Rng & Units 03/28/2023    2:47 PM 04/27/2022   10:09 AM 03/09/2022    3:06 PM  CMP  Glucose 70 - 99 mg/dL 75  213  086   BUN 6 - 23 mg/dL 21  8  13    Creatinine 0.40 - 1.20 mg/dL 5.78  4.69  6.29   Sodium 135 - 145 mEq/L 142  139  140   Potassium 3.5 - 5.1 mEq/L 4.2  3.4  3.7   Chloride 96 - 112 mEq/L 107  105  106   CO2 19 - 32 mEq/L 25  26  18    Calcium 8.4 - 10.5 mg/dL 9.3  9.6  9.8   Total Protein 6.0 - 8.3 g/dL 6.9   7.1   Total Bilirubin 0.2 - 1.2 mg/dL 0.2   0.4   Alkaline Phos 39 - 117 U/L 70     AST 0 - 37 U/L 22   16   ALT 0 - 35 U/L 17   14     Lab Results  Component Value Date   CHOL 188 06/18/2023   HDL 50.60 06/18/2023   LDLCALC 120 (H) 06/18/2023   TRIG 85.0 06/18/2023   CHOLHDL 4 06/18/2023    No results for input(s): "LIPOA" in the last 8760 hours. No components found for: "NTPROBNP" No results for input(s): "  PROBNP" in the last 8760 hours. No results for input(s): "TSH" in the last 8760 hours.  Physical Exam:    Today's Vitals   09/16/23 1428  BP: 110/66  Pulse: 87  Resp: 16  SpO2: 97%  Weight: 163 lb 3.2 oz (74 kg)  Height: 5\' 6"  (1.676 m)    Body mass index is 26.34 kg/m. Wt Readings from Last 3 Encounters:  09/16/23 163 lb 3.2 oz (74 kg)  07/22/23 168 lb (76.2 kg)  07/02/23 169 lb (76.7 kg)    Physical Exam  Constitutional: No distress.  hemodynamically stable  Neck: No JVD present.  Cardiovascular: Normal rate, regular rhythm, S1 normal and S2 normal. Exam reveals no gallop, no S3 and no S4.  No murmur heard. Pulmonary/Chest: Effort normal and breath sounds normal. No stridor. She has no wheezes. She has no rales.  Abdominal: Soft. Bowel sounds are normal. She exhibits no distension. There is no abdominal tenderness.  Musculoskeletal:        General: No edema.     Cervical back: Neck supple.  Neurological: She is alert and oriented to person, place, and time. She has intact cranial nerves (2-12).  Skin: Skin is warm.     Impression & Recommendation(s):  Impression:   ICD-10-CM   1. Calcification of native coronary artery  I25.10    I25.84     2. Benign hypertension  I10     3. Mixed hyperlipidemia  E78.2     4. Smoking  F17.200       Recommendation(s):  Calcification of native coronary artery Currently on Lipitor 80 mg p.o. nightly. PCP had uptitrated the dose to 80 mg after the findings of CAC and aortic atherosclerosis. Recommend checking lipid profile with PCP -patient had an upcoming appointment later this month to have it rechecked.  She will continue to follow with PCP with regards to lipid management Exercise treadmill stress test: Low risk study. Echocardiogram: Preserved LVEF, normal diastolic function, no significant valvular  heart disease. Reemphasized the importance of secondary prevention with focus on improving her modifiable cardiovascular risk factors such as complete smoking cessation, glycemic control, lipid management, blood pressure control, weight loss.  Benign hypertension Office blood pressure really well-controlled. Continue amlodipine 10 mg p.o. daily. Continue hydrochlorothiazide 25 mg p.o. daily. Continue olmesartan 40 mg p.o. daily. Continue Inderal 120 mg p.o. daily  Mixed hyperlipidemia Currently on Lipitor 80 mg p.o. nightly.   She denies myalgia or other side effects. Plans to follow-up with PCP.  Orders Placed:  No orders of the defined types were placed in this encounter.  Final Medication List:   No orders of the defined types were placed in this encounter.   There are no discontinued medications.   Current Outpatient Medications:    amLODipine (NORVASC) 10 MG tablet, TAKE 1 TABLET BY MOUTH EVERY DAY FOR BLOOD PRESSURE, Disp: 90 tablet, Rfl: 3   atorvastatin (LIPITOR) 80 MG tablet, Take 1 tablet (80 mg total) by mouth daily. for cholesterol., Disp: 90 tablet, Rfl: 2   buPROPion (WELLBUTRIN XL) 150 MG 24 hr tablet, Take 1 tablet (150 mg total) by mouth daily. For smoking, Disp: 90 tablet, Rfl: 0   cyclobenzaprine (FLEXERIL) 10 MG tablet, Take 1 tablet (10 mg total) by mouth 3 (three) times daily as needed for muscle spasms., Disp: 30 tablet, Rfl: 0   gabapentin (NEURONTIN) 800 MG tablet, Take 1 tablet (800 mg total) by mouth 3 (three) times daily., Disp: 90 tablet, Rfl: 2  hydrochlorothiazide (HYDRODIURIL) 25 MG tablet, Take 1 tablet (25 mg total) by mouth daily. for blood pressure, Disp: 90 tablet, Rfl: 1   olmesartan (BENICAR) 40 MG tablet, TAKE 1 TABLET BY MOUTH EVERY DAY FOR BLOOD PRESSURE, Disp: 90 tablet, Rfl: 3   omeprazole (PRILOSEC) 40 MG capsule, TAKE 1 CAPSULE BY MOUTH DAILY FOR HEARTBURN, Disp: 90 capsule, Rfl: 3   propranolol ER (INDERAL LA) 120 MG 24 hr capsule, Take  1 capsule (120 mg total) by mouth daily. For headache prevention, Disp: 90 capsule, Rfl: 3   venlafaxine XR (EFFEXOR-XR) 75 MG 24 hr capsule, TAKE 1 CAPSULE BY MOUTH EVERY MORNING WITH BREAKFAST FOR ANXIETY & HOT FLASHES, Disp: 90 capsule, Rfl: 2   HYDROcodone-acetaminophen (NORCO/VICODIN) 5-325 MG tablet, Take 1 tablet by mouth every 6 (six) hours as needed. (Patient not taking: Reported on 09/16/2023), Disp: 10 tablet, Rfl: 0   traMADol (ULTRAM) 50 MG tablet, Take 1 tablet (50 mg total) by mouth every 8 (eight) hours as needed for moderate pain (pain score 4-6). (Patient not taking: Reported on 09/16/2023), Disp: 15 tablet, Rfl: 0  Consent:    NA  Disposition:    As needed  Her questions and concerns were addressed to her satisfaction. She voices understanding of the recommendations provided during this encounter.    Signed, Tessa Lerner, DO, [facility]  [facility]  [address] [place], [place] [address] 09/16/2023 2:48 PM

## 2023-09-16 NOTE — Patient Instructions (Signed)
 Medication Instructions:  Your physician recommends that you continue on your current medications as directed. Please refer to the Current Medication list given to you today.  *If you need a refill on your cardiac medications before your next appointment, please call your pharmacy*  Lab Work: None ordered today. If you have labs (blood work) drawn today and your tests are completely normal, you will receive your results only by: MyChart Message (if you have MyChart) OR A paper copy in the mail If you have any lab test that is abnormal or we need to change your treatment, we will call you to review the results.  Testing/Procedures: None ordered today.  Follow-Up: At [facility], you and your health needs are our priority.  As part of our continuing mission to provide you with exceptional heart care, we have created designated Provider Care Teams.  These Care Teams include your primary Cardiologist (physician) and Advanced Practice Providers (APPs -  Physician Assistants and Nurse Practitioners) who all work together to provide you with the care you need, when you need it.  We recommend signing up for the patient portal called "MyChart".  Sign up information is provided on this After Visit Summary.  MyChart is used to connect with patients for Virtual Visits (Telemedicine).  Patients are able to view lab/test results, encounter notes, upcoming appointments, etc.  Non-urgent messages can be sent to your provider as well.   To learn more about what you can do with MyChart, go to [email or web address].    Your next appointment:   As needed

## 2023-10-01 ENCOUNTER — Other Ambulatory Visit: Payer: Commercial Managed Care - HMO

## 2023-10-01 ENCOUNTER — Other Ambulatory Visit

## 2023-10-01 ENCOUNTER — Other Ambulatory Visit (INDEPENDENT_AMBULATORY_CARE_PROVIDER_SITE_OTHER)

## 2023-10-01 DIAGNOSIS — R7303 Prediabetes: Secondary | ICD-10-CM | POA: Diagnosis not present

## 2023-10-01 DIAGNOSIS — I2510 Atherosclerotic heart disease of native coronary artery without angina pectoris: Secondary | ICD-10-CM

## 2023-10-01 DIAGNOSIS — E785 Hyperlipidemia, unspecified: Secondary | ICD-10-CM | POA: Diagnosis not present

## 2023-10-01 DIAGNOSIS — E559 Vitamin D deficiency, unspecified: Secondary | ICD-10-CM

## 2023-10-01 DIAGNOSIS — E1165 Type 2 diabetes mellitus with hyperglycemia: Secondary | ICD-10-CM

## 2023-10-01 LAB — LIPID PANEL
Cholesterol: 279 mg/dL — ABNORMAL HIGH (ref 0–200)
HDL: 66.3 mg/dL (ref >39.00)
LDL Cholesterol: 191 mg/dL — ABNORMAL HIGH (ref 0–99)
NonHDL: 212.69
Total CHOL/HDL Ratio: 4
Triglycerides: 108 mg/dL (ref 0.0–149.0)
VLDL: 21.6 mg/dL (ref 0.0–40.0)

## 2023-10-01 LAB — HEMOGLOBIN A1C: Hgb A1c MFr Bld: 6.5 % (ref 4.6–6.5)

## 2023-10-01 LAB — VITAMIN D 25 HYDROXY (VIT D DEFICIENCY, FRACTURES): VITD: 18.41 ng/mL — ABNORMAL LOW (ref 30.00–100.00)

## 2023-10-02 MED ORDER — VITAMIN D (ERGOCALCIFEROL) 1.25 MG (50000 UNIT) PO CAPS: ORAL_CAPSULE | ORAL | 0 refills | Status: DC

## 2023-10-02 MED ORDER — LANCET DEVICE MISC: 1.0000 | Freq: Three times a day (TID) | 0 refills | Status: AC

## 2023-10-02 MED ORDER — BLOOD GLUCOSE TEST VI STRP: 1.0000 | ORAL_STRIP | Freq: Three times a day (TID) | 0 refills | Status: DC

## 2023-10-02 MED ORDER — METFORMIN HCL ER 500 MG PO TB24: 500.0000 mg | ORAL_TABLET | Freq: Every day | ORAL | 1 refills | Status: AC

## 2023-10-02 MED ORDER — LANCETS MISC. MISC: 1.0000 | Freq: Three times a day (TID) | 0 refills | Status: DC

## 2023-10-02 MED ORDER — BLOOD GLUCOSE MONITORING SUPPL DEVI
1.0000 | Freq: Three times a day (TID) | 0 refills | Status: DC
Start: 2023-10-02 — End: 2024-04-03

## 2023-10-04 DIAGNOSIS — I10 Essential (primary) hypertension: Secondary | ICD-10-CM

## 2023-10-04 MED ORDER — HYDROCHLOROTHIAZIDE 25 MG PO TABS
25.0000 mg | ORAL_TABLET | Freq: Every day | ORAL | 1 refills | Status: AC
Start: 2023-10-04 — End: ?

## 2023-10-10 ENCOUNTER — Ambulatory Visit: Admitting: Primary Care

## 2023-10-10 VITALS — BP 118/72 | HR 120 | Temp 97.5°F | Ht 66.0 in | Wt 166.0 lb

## 2023-10-10 DIAGNOSIS — R5382 Chronic fatigue, unspecified: Secondary | ICD-10-CM | POA: Insufficient documentation

## 2023-10-10 DIAGNOSIS — R0683 Snoring: Secondary | ICD-10-CM | POA: Diagnosis not present

## 2023-10-10 DIAGNOSIS — K219 Gastro-esophageal reflux disease without esophagitis: Secondary | ICD-10-CM

## 2023-10-10 MED ORDER — FAMOTIDINE 20 MG PO TABS: 20.0000 mg | ORAL_TABLET | Freq: Every day | ORAL | 0 refills | Status: DC

## 2023-10-10 NOTE — Progress Notes (Signed)
 Subjective:    Patient ID: Maureen Orozco, female    DOB: [birth year], 58 y.o.   MRN: [id number]  Heartburn She complains of heartburn. She reports no chest pain. Associated symptoms include fatigue.    Maureen Orozco is a very pleasant 58 y.o. female with a history of hypertension, GERD, new onset type 2 diabetes, chronic back pain, tobacco use who presents today to discuss GERD and fatigue.   Her husband joins [place] today.   1) GERD: Chronic for the last 1 year. Symptoms include lower esophageal burning which is daily, occurring with any meal but worse with eating pasta, pizza, fried foods. She works night shift, lays down soon after eating most meals.   She is compliant to omeprazole 40 mg daily which has helped some, but it's not as effective as it once was.   She continues to smoke 1/2 PPD. She takes Tums daily due to her symptoms with temporary improvement.   2) Chronic Fatigue: Chronic for years. Sleeps all the time per her husband, has done so since she was a teenager. She would sleep all day and night long, especially if she doesn't have to work, but her husband doesn't allow it. She does have daytime tiredness.  She is currently working night shift from 11p-7a. She will go to bed around 7 am, wake around 4 pm, eats dinner, then lays back down until about 10 pm. When working day shift she would sleep just as much.   She does snore.  Her husband denies periods of apnea.  She has never had a sleep study.  She is managed on gabapentin and Flexeril, but this does not make her drowsy.   Review of Systems  Constitutional:  Positive for fatigue.  Respiratory:  Negative for shortness of breath.   Cardiovascular:  Negative for chest pain.  Gastrointestinal:  Positive for heartburn.       Esophageal burning  Psychiatric/Behavioral:  Negative for sleep disturbance.          Past Medical History:  Diagnosis Date   Acute viral sinusitis 03/10/2020   Essential hypertension     Hyperlipidemia    RLQ abdominal pain 04/27/2022    Social History   Socioeconomic History   Marital status: Married    Spouse name: Not on file   Number of children: Not on file   Years of education: Not on file   Highest education level: 12th grade  Occupational History   Not on file  Tobacco Use   Smoking status: Every Day    Current packs/day: 0.25    Types: Cigarettes    Passive exposure: Never   Smokeless tobacco: Never  Vaping Use   Vaping status: Some Days  Substance and Sexual Activity   Alcohol use: No    Alcohol/week: 0.0 standard drinks of alcohol   Drug use: Not Currently   Sexual activity: Not on file  Other Topics Concern   Not on file  Social History Narrative   Married.   Works at [employer] in [place].   Has one child.   Enjoys playing computer games, watching TV.         Patient is adopted       Are you right handed or left handed? Right Handed    Are you currently employed ? 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    Do you live at home alone? No    Who lives with you? Husband, son,  sister in law, husbands step dad.   What type of home do you live in: 1 story or 2 story? Lives in a two story home.       Social Drivers of Corporate investment banker Strain: Low Risk  (10/10/2023)   Overall Financial Resource Strain (CARDIA)    Difficulty of Paying Living Expenses: Not hard at all  Food Insecurity: No Food Insecurity (10/10/2023)   Hunger Vital Sign    Worried About Running Out of Food in the Last Year: Never true    Ran Out of Food in the Last Year: Never true  Transportation Needs: No Transportation Needs (10/10/2023)   PRAPARE - Administrator, Civil Service (Medical): No    Lack of Transportation (Non-Medical): No  Physical Activity: Insufficiently Active (10/10/2023)   Exercise Vital Sign    Days of Exercise per Week: 1 day    Minutes of Exercise per Session: 10 min  Stress: No Stress Concern Present  (10/10/2023)   [employer] of Occupational Health - Occupational Stress Questionnaire    Feeling of Stress : Not at all  Social Connections: Moderately Isolated (10/10/2023)   Social Connection and Isolation Panel [NHANES]    Frequency of Communication with Friends and Family: More than three times a week    Frequency of Social Gatherings with Friends and Family: Three times a week    Attends Religious Services: Never    Active Member of Clubs or Organizations: No    Attends Banker Meetings: Not on file    Marital Status: Married  Intimate Partner Violence: Not At Risk (01/28/2023)   Received from [facility]   HITS    Over the last 12 months how often did your partner physically hurt you?: Never    Over the last 12 months how often did your partner insult you or talk down to you?: Never    Over the last 12 months how often did your partner threaten you with physical harm?: Never    Over the last 12 months how often did your partner scream or curse at you?: Never    Past Surgical History:  Procedure Laterality Date   BREAST BIOPSY Left 03/09/2016    hyalinized fibroadenoma    COLONOSCOPY  10/30/2019   DENTAL SURGERY      Family History  Adopted: Yes    No Known Allergies  Current Outpatient Medications on File Prior to Visit  Medication Sig Dispense Refill   amLODipine (NORVASC) 10 MG tablet TAKE 1 TABLET BY MOUTH EVERY DAY FOR BLOOD PRESSURE 90 tablet 3   atorvastatin (LIPITOR) 80 MG tablet Take 1 tablet (80 mg total) by mouth daily. for cholesterol. 90 tablet 2   Blood Glucose Monitoring Suppl DEVI 1 each by Does not apply route in the morning, at noon, and at bedtime. May substitute to any manufacturer covered by patient's insurance. 1 each 0   buPROPion (WELLBUTRIN XL) 150 MG 24 hr tablet Take 1 tablet (150 mg total) by mouth daily. For smoking 90 tablet 0   cyclobenzaprine (FLEXERIL) 10 MG tablet Take 1 tablet (10 mg total) by mouth 3 (three) times  daily as needed for muscle spasms. 30 tablet 0   gabapentin (NEURONTIN) 800 MG tablet Take 1 tablet (800 mg total) by mouth 3 (three) times daily. 90 tablet 2   Glucose Blood (BLOOD GLUCOSE TEST STRIPS) STRP 1 each by In Vitro route in the morning, at noon, and at bedtime. May substitute  to any manufacturer covered by patient's insurance. 300 strip 0   hydrochlorothiazide (HYDRODIURIL) 25 MG tablet Take 1 tablet (25 mg total) by mouth daily. for blood pressure 90 tablet 1   Lancet Device MISC 1 each by Does not apply route in the morning, at noon, and at bedtime. May substitute to any manufacturer covered by patient's insurance. 1 each 0   Lancets Misc. MISC 1 each by Does not apply route in the morning, at noon, and at bedtime. May substitute to any manufacturer covered by patient's insurance. 300 each 0   metFORMIN (GLUCOPHAGE-XR) 500 MG 24 hr tablet Take 1 tablet (500 mg total) by mouth daily with breakfast. for diabetes. 90 tablet 1   olmesartan (BENICAR) 40 MG tablet TAKE 1 TABLET BY MOUTH EVERY DAY FOR BLOOD PRESSURE 90 tablet 3   omeprazole (PRILOSEC) 40 MG capsule TAKE 1 CAPSULE BY MOUTH DAILY FOR HEARTBURN 90 capsule 3   propranolol ER (INDERAL LA) 120 MG 24 hr capsule Take 1 capsule (120 mg total) by mouth daily. For headache prevention 90 capsule 3   venlafaxine XR (EFFEXOR-XR) 75 MG 24 hr capsule TAKE 1 CAPSULE BY MOUTH EVERY MORNING WITH BREAKFAST FOR ANXIETY & HOT FLASHES 90 capsule 2   Vitamin D, Ergocalciferol, (DRISDOL) 1.25 MG (50000 UNIT) CAPS capsule Take 1 capsule by mouth once weekly for 12 weeks. 12 capsule 0   traMADol (ULTRAM) 50 MG tablet Take 1 tablet (50 mg total) by mouth every 8 (eight) hours as needed for moderate pain (pain score 4-6). (Patient not taking: Reported on 10/10/2023) 15 tablet 0   No current facility-administered medications on file prior to visit.    BP 118/72   Pulse (!) 120   Temp (!) 97.5 F (36.4 C) (Temporal)   Ht 5\' 6"  (1.676 m)   Wt 166 lb  (75.3 kg)   SpO2 98%   BMI 26.79 kg/m  Objective:   Physical Exam Cardiovascular:     Rate and Rhythm: Normal rate and regular rhythm.  Pulmonary:     Effort: Pulmonary effort is normal.     Breath sounds: Normal breath sounds.  Musculoskeletal:     Cervical back: Neck supple.  Skin:    General: Skin is warm and dry.  Neurological:     Mental Status: She is alert and oriented to person, place, and time.  Psychiatric:        Mood and Affect: Mood normal.           Assessment & Plan:  Chronic fatigue Assessment & Plan: Checking labs today including iron studies, thyroid function. Continue supplemental vitamin D.  STOP-BANG score of 3 today.  Referral placed to pulmonology for evaluation.   Orders: -     TSH -     CBC -     IBC + Ferritin -     Pulmonary Visit  Gastroesophageal reflux disease, unspecified whether esophagitis present Assessment & Plan: Uncontrolled.  Discussed common triggers of GERD.  We also discussed to avoid laying flat within 2 hours of eating.  Continue omeprazole 40 mg daily. Add famotidine 20 mg daily.  She will update.  Orders: -     Famotidine; Take 1 tablet (20 mg total) by mouth daily. For heartburn  Dispense: 90 tablet; Refill: 0  Snoring -     Pulmonary Visit        Doreene Nest, NP

## 2023-10-10 NOTE — Patient Instructions (Addendum)
 Start famotidine 20 mg daily for heartburn. Continue omeprazole 40 mg daily for heartburn.  You will either be contacted via phone regarding your referral to pulmonology for sleep study, or you may receive a letter on your MyChart portal from our referral team with instructions for scheduling an appointment. Please let us know if you have not been contacted by anyone within two weeks.  Please schedule a follow up visit for 3 months.  It was a pleasure to see you today!

## 2023-10-10 NOTE — Assessment & Plan Note (Signed)
 Uncontrolled.  Discussed common triggers of GERD.  We also discussed to avoid laying flat within 2 hours of eating.  Continue omeprazole 40 mg daily. Add famotidine 20 mg daily.  She will update.

## 2023-10-10 NOTE — Assessment & Plan Note (Signed)
 Checking labs today including iron studies, thyroid function. Continue supplemental vitamin D.  STOP-BANG score of 3 today.  Referral placed to pulmonology for evaluation.

## 2023-10-11 LAB — CBC
HCT: 39.3 % (ref 36.0–46.0)
Hemoglobin: 13.4 g/dL (ref 12.0–15.0)
MCHC: 34.1 g/dL (ref 30.0–36.0)
MCV: 97.5 fl (ref 78.0–100.0)
Platelets: 222 10*3/uL (ref 150.0–400.0)
RBC: 4.02 Mil/uL (ref 3.87–5.11)
RDW: 14.4 % (ref 11.5–15.5)
WBC: 7.9 10*3/uL (ref 4.0–10.5)

## 2023-10-11 LAB — TSH: TSH: 2.45 u[IU]/mL (ref 0.35–5.50)

## 2023-10-11 LAB — IBC + FERRITIN
Ferritin: 33.5 ng/mL (ref 10.0–291.0)
Iron: 73 ug/dL (ref 42–145)
Saturation Ratios: 18.4 % — ABNORMAL LOW (ref 20.0–50.0)
TIBC: 397.6 ug/dL (ref 250.0–450.0)
Transferrin: 284 mg/dL (ref 212.0–360.0)

## 2023-10-23 DIAGNOSIS — M51362 Other intervertebral disc degeneration, lumbar region with discogenic back pain and lower extremity pain: Secondary | ICD-10-CM | POA: Diagnosis not present

## 2023-10-23 DIAGNOSIS — M5416 Radiculopathy, lumbar region: Secondary | ICD-10-CM | POA: Diagnosis not present

## 2023-10-29 DIAGNOSIS — E119 Type 2 diabetes mellitus without complications: Secondary | ICD-10-CM | POA: Diagnosis not present

## 2023-11-06 ENCOUNTER — Ambulatory Visit (INDEPENDENT_AMBULATORY_CARE_PROVIDER_SITE_OTHER): Payer: Self-pay | Admitting: Nurse Practitioner

## 2023-11-06 ENCOUNTER — Encounter: Payer: Self-pay | Admitting: Nurse Practitioner

## 2023-11-06 VITALS — BP 112/72 | HR 112 | Ht 66.0 in | Wt 169.2 lb

## 2023-11-06 DIAGNOSIS — G4720 Circadian rhythm sleep disorder, unspecified type: Secondary | ICD-10-CM

## 2023-11-06 DIAGNOSIS — R0683 Snoring: Secondary | ICD-10-CM

## 2023-11-06 DIAGNOSIS — G4719 Other hypersomnia: Secondary | ICD-10-CM

## 2023-11-06 NOTE — Patient Instructions (Addendum)
 Given your symptoms, I am concerned that you may have sleep disordered breathing with sleep apnea. You will need a sleep study for further evaluation. Someone will contact you to schedule this.   We discussed how untreated sleep apnea puts an individual at risk for cardiac arrhthymias, pulm HTN, DM, stroke and increases their risk for daytime accidents. We also briefly reviewed treatment options including weight loss, side sleeping position, oral appliance, CPAP therapy or referral to ENT for possible surgical options  Use caution when driving and pull over if you become sleepy.  Follow up in 6 weeks with Katie Emmanuell Kantz,NP to go over sleep study results, or sooner, if needed. Friday PM virtual clinic preferred

## 2023-11-06 NOTE — Progress Notes (Unsigned)
 @Patient  ID: Maureen Orozco, female    DOB: 1965-10-19, 58 y.o.   MRN: [id number]  Chief Complaint  Patient presents with   Follow-up    Discuss sleep study , having issues with snoring and going to sleep she works 3rd shift     Referring provider: Gabriel John, NP  HPI: 58 year old female, some day smoker referred for sleep consult. Past medical history significant for HTN, GERD, DDD, HLD, prediabetes.   TEST/EVENTS:   11/06/2023: Today - sleep consult Discussed the use of AI scribe software for clinical note transcription with the patient, who gave verbal consent to proceed.  History of Present Illness   ADDI PAK is a 58 year old female who presents with possible sleep apnea. She was referred by her primary care provider for concern of possible sleep apnea.  She experiences sleep disturbances, including snoring and daytime sleepiness. Her husband has observed episodes of apnea during sleep. She wakes with headaches and feels groggy and tired throughout the day. No incidents of drowsy driving or sleepwalking are reported.  These symptoms have been present for approximately one to three and a half years. She has been working third shift, which she notes may be causing some of her sleepiness. Her husband noted that snoring was present even before this job change.  She has tried melatonin to aid sleep, but it causes excessive drowsiness and vivid dreams, making it difficult to wake up. She does not consume alcohol or take any sleep medications. She consumes some caffeine during the day. She is a smoker; 1/2 ppd   She goes to bed between 7-9 am after work shifts and 10pm to midnight on other days. Falls asleep quickly and doesn't tend to wake up at all. Never had a prior sleep study. No significant weight change. Lives with her husband, son and daughter in law.   She works at [employer]. No heavy machinery.  [place] [address]      No Known Allergies  Immunization History   Administered Date(s) Administered   Influenza, Seasonal, Injecte, Preservative Fre 03/28/2023   Influenza,inj,Quad PF,6+ Mos 03/30/2015, 07/28/2018, 06/02/2021   Pneumococcal Polysaccharide-23 09/30/2019   Tdap 03/30/2015   Zoster Recombinant(Shingrix) 12/29/2019, 03/01/2020    Past Medical History:  Diagnosis Date   Acute viral sinusitis 03/10/2020   Essential hypertension    Hyperlipidemia    RLQ abdominal pain 04/27/2022    Tobacco History: Social History   Tobacco Use  Smoking Status Some Days   Current packs/day: 0.25   Types: Cigarettes   Passive exposure: Never  Smokeless Tobacco Never   Ready to quit: Not Answered Counseling given: Not Answered   Outpatient Medications Prior to Visit  Medication Sig Dispense Refill   amLODipine  (NORVASC ) 10 MG tablet TAKE 1 TABLET BY MOUTH EVERY DAY FOR BLOOD PRESSURE 90 tablet 3   atorvastatin  (LIPITOR) 80 MG tablet Take 1 tablet (80 mg total) by mouth daily. for cholesterol. 90 tablet 2   Blood Glucose Monitoring Suppl DEVI 1 each by Does not apply route in the morning, at noon, and at bedtime. May substitute to any manufacturer covered by patient's insurance. 1 each 0   buPROPion  (WELLBUTRIN  XL) 150 MG 24 hr tablet Take 1 tablet (150 mg total) by mouth daily. For smoking 90 tablet 0   cyclobenzaprine  (FLEXERIL ) 10 MG tablet Take 1 tablet (10 mg total) by mouth 3 (three) times daily as needed for muscle spasms. 30 tablet 0   famotidine  (PEPCID ) 20  MG tablet Take 1 tablet (20 mg total) by mouth daily. For heartburn 90 tablet 0   gabapentin  (NEURONTIN ) 800 MG tablet Take 1 tablet (800 mg total) by mouth 3 (three) times daily. 90 tablet 2   Glucose Blood (BLOOD GLUCOSE TEST STRIPS) STRP 1 each by In Vitro route in the morning, at noon, and at bedtime. May substitute to any manufacturer covered by patient's insurance. 300 strip 0   hydrochlorothiazide  (HYDRODIURIL ) 25 MG tablet Take 1 tablet (25 mg total) by mouth daily. for blood  pressure 90 tablet 1   Lancets Misc. MISC 1 each by Does not apply route in the morning, at noon, and at bedtime. May substitute to any manufacturer covered by patient's insurance. 300 each 0   metFORMIN  (GLUCOPHAGE -XR) 500 MG 24 hr tablet Take 1 tablet (500 mg total) by mouth daily with breakfast. for diabetes. 90 tablet 1   olmesartan  (BENICAR ) 40 MG tablet TAKE 1 TABLET BY MOUTH EVERY DAY FOR BLOOD PRESSURE 90 tablet 3   omeprazole  (PRILOSEC) 40 MG capsule TAKE 1 CAPSULE BY MOUTH DAILY FOR HEARTBURN 90 capsule 3   propranolol  ER (INDERAL  LA) 120 MG 24 hr capsule Take 1 capsule (120 mg total) by mouth daily. For headache prevention 90 capsule 3   venlafaxine  XR (EFFEXOR -XR) 75 MG 24 hr capsule TAKE 1 CAPSULE BY MOUTH EVERY MORNING WITH BREAKFAST FOR ANXIETY & HOT FLASHES 90 capsule 2   Vitamin D , Ergocalciferol , (DRISDOL ) 1.25 MG (50000 UNIT) CAPS capsule Take 1 capsule by mouth once weekly for 12 weeks. 12 capsule 0   traMADol  (ULTRAM ) 50 MG tablet Take 1 tablet (50 mg total) by mouth every 8 (eight) hours as needed for moderate pain (pain score 4-6). (Patient not taking: Reported on 10/10/2023) 15 tablet 0   No facility-administered medications prior to visit.     Review of Systems:   Constitutional: No weight loss or gain, night sweats, fevers, chills, or lassitude. +fatigue  HEENT: No headaches, difficulty swallowing, tooth/dental problems, or sore throat. No sneezing, itching, ear ache, nasal congestion, or post nasal drip CV:  No chest pain, orthopnea, PND, swelling in lower extremities, anasarca, dizziness, palpitations, syncope Resp: +snoring, witnessed apneas. No shortness of breath with exertion or at rest. No excess mucus or change in color of mucus. No productive or non-productive. No hemoptysis. No wheezing.  No chest wall deformity GI:  + heartburn, indigestion (stable). No abdominal pain GU: No nocturia  Skin: No rash, lesions, ulcerations MSK:  No joint pain or swelling.    Neuro: No dizziness or lightheadedness.  Psych: No depression or anxiety. Mood stable.     Physical Exam:  BP 112/72   Pulse (!) 112   Ht 5\' 6"  (1.676 m)   Wt 169 lb 3.2 oz (76.7 kg)   SpO2 97%   BMI 27.31 kg/m   GEN: Pleasant, interactive, well-kempt; in no acute distress. HEENT:  Normocephalic and atraumatic. PERRLA. Sclera white. Nasal turbinates pink, moist and patent bilaterally. No rhinorrhea present. Oropharynx pink and moist, without exudate or edema. No lesions, ulcerations, or postnasal drip. Mallampati II NECK:  Supple w/ fair ROM. No lymphadenopathy.   CV: RRR, no m/r/g, no peripheral edema. Pulses intact, +2 bilaterally. No cyanosis, pallor or clubbing. PULMONARY:  Unlabored, regular breathing. Clear bilaterally A&P w/o wheezes/rales/rhonchi. No accessory muscle use.  GI: BS present and normoactive. Soft, non-tender to palpation. No organomegaly or masses detected.  MSK: No erythema, warmth or tenderness. Cap refil <2 sec all extrem.  Neuro: A/Ox3. No focal deficits noted.   Skin: Warm, no lesions or rashe Psych: Normal affect and behavior. Judgement and thought content appropriate.     Lab Results:  CBC    Component Value Date/Time   WBC 7.9 10/10/2023 1509   RBC 4.02 10/10/2023 1509   HGB 13.4 10/10/2023 1509   HCT 39.3 10/10/2023 1509   PLT 222.0 10/10/2023 1509   MCV 97.5 10/10/2023 1509   MCH 33.0 12/02/2020 1549   MCHC 34.1 10/10/2023 1509   RDW 14.4 10/10/2023 1509   LYMPHSABS 0.9 04/27/2022 1009   MONOABS 0.3 04/27/2022 1009   EOSABS 0.1 04/27/2022 1009   BASOSABS 0.0 04/27/2022 1009    BMET    Component Value Date/Time   NA 142 03/28/2023 1447   K 4.2 03/28/2023 1447   CL 107 03/28/2023 1447   CO2 25 03/28/2023 1447   GLUCOSE 75 03/28/2023 1447   BUN 21 03/28/2023 1447   CREATININE 0.94 03/28/2023 1447   CREATININE 1.02 03/09/2022 1506   CALCIUM  9.3 03/28/2023 1447    BNP No results found for: "BNP"   Imaging:  No results  found.  Administration History     None           No data to display          No results found for: "NITRICOXIDE"      Assessment & Plan:   Excessive daytime sleepiness She has snoring, excessive daytime sleepiness, nocturnal apneic events, morning headaches, restless sleep. BMI 27. Given this,  I am concerned she could have sleep disordered breathing with obstructive sleep apnea. She will need sleep study for further evaluation.    - discussed how weight can impact sleep and risk for sleep disordered breathing - discussed options to assist with weight loss: combination of diet modification, cardiovascular and strength training exercises   - had an extensive discussion regarding the adverse health consequences related to untreated sleep disordered breathing - specifically discussed the risks for hypertension, coronary artery disease, cardiac dysrhythmias, cerebrovascular disease, and diabetes - lifestyle modification discussed   - discussed how sleep disruption can increase risk of accidents, particularly when driving - safe driving practices were discussed  Patient Instructions  Given your symptoms, I am concerned that you may have sleep disordered breathing with sleep apnea. You will need a sleep study for further evaluation. Someone will contact you to schedule this.   We discussed how untreated sleep apnea puts an individual at risk for cardiac arrhthymias, pulm HTN, DM, stroke and increases their risk for daytime accidents. We also briefly reviewed treatment options including weight loss, side sleeping position, oral appliance, CPAP therapy or referral to ENT for possible surgical options  Use caution when driving and pull over if you become sleepy.  Follow up in 6 weeks with Katie Yoshito Gaza,NP to go over sleep study results, or sooner, if needed. 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 Possible  component of circadian rhythm sleep disorder. Side effects with melatonin. Sleep hygiene reviewed. See above.    Advised if symptoms do not improve or worsen, to please contact office for sooner follow up or seek emergency care.   I spent 35 minutes of dedicated to the care of this patient on the date of this encounter to include pre-visit review of records, face-to-face time with the patient discussing conditions above, post visit ordering of testing, clinical documentation with the  electronic health record, making appropriate referrals as documented, and communicating necessary findings to members of the patients care team.  Roetta Clarke, NP 11/08/2023  Pt aware and understands NP's role.

## 2023-11-08 ENCOUNTER — Encounter: Payer: Self-pay | Admitting: Nurse Practitioner

## 2023-11-08 DIAGNOSIS — G4719 Other hypersomnia: Secondary | ICD-10-CM | POA: Insufficient documentation

## 2023-11-08 DIAGNOSIS — R0683 Snoring: Secondary | ICD-10-CM | POA: Insufficient documentation

## 2023-11-08 DIAGNOSIS — G4720 Circadian rhythm sleep disorder, unspecified type: Secondary | ICD-10-CM | POA: Insufficient documentation

## 2023-11-08 NOTE — Assessment & Plan Note (Signed)
 She has snoring, excessive daytime sleepiness, nocturnal apneic events, morning headaches, restless sleep. BMI 27. Given this,  I am concerned she could have sleep disordered breathing with obstructive sleep apnea. She will need sleep study for further evaluation.    - discussed how weight can impact sleep and risk for sleep disordered breathing - discussed options to assist with weight loss: combination of diet modification, cardiovascular and strength training exercises   - had an extensive discussion regarding the adverse health consequences related to untreated sleep disordered breathing - specifically discussed the risks for hypertension, coronary artery disease, cardiac dysrhythmias, cerebrovascular disease, and diabetes - lifestyle modification discussed   - discussed how sleep disruption can increase risk of accidents, particularly when driving - safe driving practices were discussed  Patient Instructions  Given your symptoms, I am concerned that you may have sleep disordered breathing with sleep apnea. You will need a sleep study for further evaluation. Someone will contact you to schedule this.   We discussed how untreated sleep apnea puts an individual at risk for cardiac arrhthymias, pulm HTN, DM, stroke and increases their risk for daytime accidents. We also briefly reviewed treatment options including weight loss, side sleeping position, oral appliance, CPAP therapy or referral to ENT for possible surgical options  Use caution when driving and pull over if you become sleepy.  Follow up in 6 weeks with Katie Sevon Rotert,NP to go over sleep study results, or sooner, if needed. Friday PM virtual clinic preferred

## 2023-11-08 NOTE — Assessment & Plan Note (Addendum)
 3rd shift work. Possible component of circadian rhythm sleep disorder. Side effects with melatonin. Sleep hygiene reviewed. See above.

## 2023-11-08 NOTE — Assessment & Plan Note (Signed)
 See above

## 2023-11-13 DIAGNOSIS — M5416 Radiculopathy, lumbar region: Secondary | ICD-10-CM | POA: Diagnosis not present

## 2023-11-20 DIAGNOSIS — R0683 Snoring: Secondary | ICD-10-CM

## 2023-11-25 ENCOUNTER — Ambulatory Visit: Payer: Commercial Managed Care - HMO | Admitting: Podiatry

## 2023-11-28 ENCOUNTER — Other Ambulatory Visit: Payer: Self-pay | Admitting: Primary Care

## 2023-11-28 DIAGNOSIS — E559 Vitamin D deficiency, unspecified: Secondary | ICD-10-CM

## 2023-12-02 ENCOUNTER — Encounter: Payer: Self-pay | Admitting: Podiatry

## 2023-12-02 ENCOUNTER — Ambulatory Visit: Admitting: Podiatry

## 2023-12-02 ENCOUNTER — Other Ambulatory Visit: Payer: Self-pay | Admitting: Podiatry

## 2023-12-02 MED ORDER — GABAPENTIN 800 MG PO TABS: 800.0000 mg | ORAL_TABLET | Freq: Three times a day (TID) | ORAL | 2 refills | Status: DC

## 2023-12-05 DIAGNOSIS — G4733 Obstructive sleep apnea (adult) (pediatric): Secondary | ICD-10-CM | POA: Diagnosis not present

## 2023-12-13 ENCOUNTER — Ambulatory Visit: Payer: Self-pay | Admitting: Nurse Practitioner

## 2023-12-13 DIAGNOSIS — G4733 Obstructive sleep apnea (adult) (pediatric): Secondary | ICD-10-CM

## 2023-12-13 NOTE — Telephone Encounter (Signed)
-----   Message from Roetta Clarke sent at 12/13/2023 10:37 AM EDT ----- Very severe sleep apnea. Please order urgent in lab CPAP titration study. Thanks.

## 2023-12-13 NOTE — Progress Notes (Signed)
 Very severe sleep apnea. Please order urgent in lab CPAP titration study. Thanks.

## 2023-12-16 NOTE — Telephone Encounter (Signed)
 Can we see if this can be moved up? Thanks.

## 2023-12-17 NOTE — Telephone Encounter (Signed)
 It looks like Maureen Orozco has the patient scheduled for 12/19/23. [facility] would still need to get PA for her to be scheduled with them. I have left a message for the patient to make sure she is aware of the appt

## 2023-12-19 ENCOUNTER — Ambulatory Visit (HOSPITAL_BASED_OUTPATIENT_CLINIC_OR_DEPARTMENT_OTHER): Payer: Self-pay | Attending: Nurse Practitioner | Admitting: Pulmonary Disease

## 2023-12-19 VITALS — Ht 66.0 in | Wt 175.0 lb

## 2023-12-19 DIAGNOSIS — G4733 Obstructive sleep apnea (adult) (pediatric): Secondary | ICD-10-CM | POA: Diagnosis not present

## 2023-12-20 DIAGNOSIS — G4733 Obstructive sleep apnea (adult) (pediatric): Secondary | ICD-10-CM | POA: Insufficient documentation

## 2023-12-25 DIAGNOSIS — M51362 Other intervertebral disc degeneration, lumbar region with discogenic back pain and lower extremity pain: Secondary | ICD-10-CM | POA: Diagnosis not present

## 2023-12-25 DIAGNOSIS — M5416 Radiculopathy, lumbar region: Secondary | ICD-10-CM | POA: Diagnosis not present

## 2023-12-25 DIAGNOSIS — E1141 Type 2 diabetes mellitus with diabetic mononeuropathy: Secondary | ICD-10-CM | POA: Diagnosis not present

## 2023-12-31 DIAGNOSIS — G4733 Obstructive sleep apnea (adult) (pediatric): Secondary | ICD-10-CM

## 2023-12-31 NOTE — Procedures (Signed)
 [facility] [address] [place], [place] [address] Tel: [phone]   Fax: [phone]  Titration Interpretation  Patient Name:  Maureen Orozco, Maureen Orozco Date:  12/19/2023 Referring Physician:  Tretha Fu, NP  Indications for Polysomnography The patient is a 58 year old Female who is 5' 6 and weighs 175.0 lbs. Her BMI equals 28.5.  A full night titration treatment study was performed.  Polysomnogram Data A full night polysomnogram recorded the standard physiologic parameters including EEG, EOG, EMG, EKG, nasal and oral airflow.  Respiratory parameters of chest and abdominal movements were recorded with Respiratory Inductance Plethysmography belts.  Oxygen saturation was recorded by pulse oximetry.   Sleep Architecture The total recording time of the polysomnogram was 377.6 minutes.  The total sleep time was 296.0 minutes.  The patient spent 11.7% of total sleep time in Stage N1, 87.8% in Stage N2, 0.5% in Stages N3, and 0.0% in REM.  Sleep latency was 15.8 minutes.  REM latency was - minutes.  Sleep Efficiency was 78.4%.  Wake after Sleep Onset time was 65.5 minutes.  Titration Summary The patient was titrated at pressures ranging from 5* cm/H20 up to 17/13/12** cm/H20 .  The last pressure used in the study was 17/13/12** cm/H20   Respiratory Events The polysomnogram revealed a presence of 5 obstructive, 111 central, and 1 mixed apnea resulting in an Apnea index of 23.7 events per hour.  There were 185 hypopneas (>=3% desaturation and/or arousal) resulting in an Apnea\Hypopnea Index (AHI >=3% desaturation and/or arousal) of 61.2 events per hour.  There were 37 hypopneas (>=4% desaturation) resulting in an Apnea\Hypopnea Index (AHI >=4% desaturation) of 31.2 events per hour.  There were 20 Respiratory Effort Related Arousals resulting in a RERA index of 4.1 events per hour. The Respiratory Disturbance Index is 65.3 events per hour.  The snore index was  - events per hour.  Mean oxygen saturation was 93.7%.  The lowest oxygen saturation during sleep was 88.0%.  Time spent <=88% oxygen saturation was 0.2 minutes (0.1%).  Limb Activity There were 18 limb movements recorded.  Of this total, - were classified as PLMs.  Of the PLMs, - were associated with arousals.  The Limb Movement index was 3.6 per hour while the PLM index was - per hour.  Cardiac Summary The average pulse rate was 64.1 bpm.  The minimum pulse rate was 52.0 bpm while the maximum pulse rate was 79.0 bpm.  Cardiac rhythm was normal/abnormal.  Diagnosis: Severe OSA Treatment emergent central apneas  Recommendations: Central apneas emerged on CPAP of 6 cm and persisted on BiPAP.  Final pressure used to was 17/13 with a backup rate of 12.The residual AHI was 16/hour.  Titration was suboptimal. Would recommend auto bilevel, EPAP 10 cm, pressure support +4, IPAP maximum 20 with a backup rate of 12 Check download on the settings and tweak pressure as needed.  If central apneas persist on the settings for more than 3 months, then consider ASV titration A small to medium fullface mask was used   This study was personally reviewed and electronically signed by: Celene Coins, MD Accredited Board Certified in Sleep Medicine

## 2024-01-01 ENCOUNTER — Ambulatory Visit: Payer: Self-pay | Admitting: Nurse Practitioner

## 2024-01-01 DIAGNOSIS — G4733 Obstructive sleep apnea (adult) (pediatric): Secondary | ICD-10-CM

## 2024-01-01 DIAGNOSIS — G4731 Primary central sleep apnea: Secondary | ICD-10-CM

## 2024-01-01 NOTE — Telephone Encounter (Signed)
-----   Message from Roetta Clarke sent at 01/01/2024  3:51 PM EDT ----- Central apneas emerged on CPAP of 6 cm. Transitioned to BiPAP.     Please send order for urgent auto BiPAP IPAP max 20, EPAP min 10 cm, pressure support +4, backup rate of 12 If central apneas persist on the settings for more than 3 months, then consider ASV titration A small to medium fullface mask was used  Thanks!  ----- Message ----- From: Lind Repine, MD Sent: 12/31/2023  10:42 AM EDT To: Roetta Clarke, NP

## 2024-01-01 NOTE — Telephone Encounter (Signed)
 I have notified the patient. She will call back once she has received the BiPAP to schedule a 31-90 day follow upappt.  Nothing further needed.

## 2024-01-01 NOTE — Progress Notes (Signed)
 Central apneas emerged on CPAP of 6 cm. Transitioned to BiPAP.     Please send order for urgent auto BiPAP IPAP max 20, EPAP min 10 cm, pressure support +4, backup rate of 12 If central apneas persist on the settings for more than 3 months, then consider ASV titration A small to medium fullface mask was used  Thanks!

## 2024-01-07 NOTE — Addendum Note (Signed)
 Addended by: VICCI EVALENE DEL on: 01/07/2024 03:10 PM   Modules accepted: Orders

## 2024-01-07 NOTE — Telephone Encounter (Deleted)
 Hey Mrs. Trine,  I am so sorry you have not got your machine. I did reach out to the Rep for [employer]. He said your insurance denied the BIPAP because we did not word the order like they wanted. I have fixed the order and we are getting it sent back now. The rep said once he receives it he will get it sent back to your insurance for approval and they will reach out to you as soon as they get the approval. Please let me know if you have not heard from them by the end of the week.  Evalene, CMA

## 2024-01-08 NOTE — Addendum Note (Signed)
 Addended by: VICCI EVALENE DEL on: 01/08/2024 11:32 AM   Modules accepted: Orders

## 2024-01-10 DIAGNOSIS — G4733 Obstructive sleep apnea (adult) (pediatric): Secondary | ICD-10-CM | POA: Diagnosis not present

## 2024-01-10 NOTE — Telephone Encounter (Deleted)
 Hey Mrs. Njie, I just got off the phone with the Rep at [employer]). He said he is having the machine sent overnight and he will be reaching out to you today to schedule a time to get you the machine. I am so sorry this is taking so long.   Evalene, CMA

## 2024-01-13 ENCOUNTER — Ambulatory Visit: Admitting: Nurse Practitioner

## 2024-01-14 ENCOUNTER — Ambulatory Visit: Admitting: Primary Care

## 2024-01-14 ENCOUNTER — Encounter (HOSPITAL_BASED_OUTPATIENT_CLINIC_OR_DEPARTMENT_OTHER): Payer: Self-pay | Admitting: Pulmonary Disease

## 2024-01-14 VITALS — BP 124/84 | HR 117 | Temp 98.0°F | Ht 66.0 in | Wt 164.0 lb

## 2024-01-14 DIAGNOSIS — Z7984 Long term (current) use of oral hypoglycemic drugs: Secondary | ICD-10-CM

## 2024-01-14 DIAGNOSIS — E559 Vitamin D deficiency, unspecified: Secondary | ICD-10-CM | POA: Diagnosis not present

## 2024-01-14 DIAGNOSIS — E1165 Type 2 diabetes mellitus with hyperglycemia: Secondary | ICD-10-CM

## 2024-01-14 LAB — POCT GLYCOSYLATED HEMOGLOBIN (HGB A1C): Hemoglobin A1C: 5.3 % (ref 4.0–5.6)

## 2024-01-14 NOTE — Assessment & Plan Note (Signed)
 Controlled with A1C of 5.3 today!  Continue metformin  ER 500 mg daily. Urine microalbumin pending.  Follow up in 3 months for CPE

## 2024-01-14 NOTE — Progress Notes (Signed)
 Subjective:    Patient ID: Maureen Orozco, female    DOB: 09/19/1965, 58 y.o.   MRN: [id number]  HPI  Maureen Orozco is a very pleasant 58 y.o. female with a history of hypertension, OSA, type 2 diabetes, hyperlipidemia, tobacco use who presents today for follow up of diabetes.  She is due for repeat Vitamin D  level today.   Current medications include: metformin  ER 500 mg daily. She denies diarrhea, bloating, upset stomach.   She is checking her blood glucose 2 times daily and is getting readings of:  6 am: 80-160s Bedtime: 80-160s.   Last A1C: 6.5 in March 2025, 5.3 today Last Eye Exam: UTD Last Foot Exam: UTD Pneumonia Vaccination: 2021 Urine Microalbumin: Due Statin: atorvastatin    Dietary changes since last visit: Working on cutting back on fried/fast food, limits sweet food and drinks.    Exercise: None  BP Readings from Last 3 Encounters:  01/14/24 124/84  11/06/23 112/72  10/10/23 118/72         Review of Systems  Eyes:  Negative for visual disturbance.  Respiratory:  Negative for shortness of breath.   Cardiovascular:  Negative for chest pain.  Neurological:  Positive for numbness. Negative for dizziness.         Past Medical History:  Diagnosis Date   Acute viral sinusitis 03/10/2020   Essential hypertension    Hyperlipidemia    RLQ abdominal pain 04/27/2022    Social History   Socioeconomic History   Marital status: Married    Spouse name: Not on file   Number of children: Not on file   Years of education: Not on file   Highest education level: 12th grade  Occupational History   Not on file  Tobacco Use   Smoking status: Some Days    Current packs/day: 0.25    Types: Cigarettes    Passive exposure: Never   Smokeless tobacco: Never  Vaping Use   Vaping status: Some Days  Substance and Sexual Activity   Alcohol use: No    Alcohol/week: 0.0 standard drinks of alcohol   Drug use: Not Currently   Sexual activity: Not on file   Other Topics Concern   Not on file  Social History Narrative   Married.   Works at [employer] in [place].   Has one child.   Enjoys playing computer games, watching TV.         Patient is adopted       Are you right handed or left handed? Right Handed    Are you currently employed ? 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 [employer] 3rd shift.    Do you live at home alone? No    Who lives with you? Husband, son, sister in law, husbands step dad.   What type of home do you live in: 1 story or 2 story? Lives in a two story home.       Social Drivers of Corporate investment banker Strain: Low Risk  (01/14/2024)   Overall Financial Resource Strain (CARDIA)    Difficulty of Paying Living Expenses: Not hard at all  Food Insecurity: No Food Insecurity (01/14/2024)   Hunger Vital Sign    Worried About Running Out of Food in the Last Year: Never true    Ran Out of Food in the Last Year: Never true  Transportation Needs: No Transportation Needs (01/14/2024)   PRAPARE - Administrator, Civil Service (Medical): No  Lack of Transportation (Non-Medical): No  Physical Activity: Inactive (01/14/2024)   Exercise Vital Sign    Days of Exercise per Week: 0 days    Minutes of Exercise per Session: Not on file  Stress: No Stress Concern Present (01/14/2024)   [employer] of Occupational Health - Occupational Stress Questionnaire    Feeling of Stress: Only a little  Social Connections: Moderately Isolated (01/14/2024)   Social Connection and Isolation Panel    Frequency of Communication with Friends and Family: More than three times a week    Frequency of Social Gatherings with Friends and Family: Once a week    Attends Religious Services: Never    Database administrator or Organizations: No    Attends Engineer, structural: Not on file    Marital Status: Married  Catering manager Violence: Not At Risk (01/28/2023)   Received from [facility]   HITS    Over the last 12  months how often did your partner physically hurt you?: Never    Over the last 12 months how often did your partner insult you or talk down to you?: Never    Over the last 12 months how often did your partner threaten you with physical harm?: Never    Over the last 12 months how often did your partner scream or curse at you?: Never    Past Surgical History:  Procedure Laterality Date   BREAST BIOPSY Left 03/09/2016    hyalinized fibroadenoma    COLONOSCOPY  10/30/2019   DENTAL SURGERY      Family History  Adopted: Yes    No Known Allergies  Current Outpatient Medications on File Prior to Visit  Medication Sig Dispense Refill   amLODipine  (NORVASC ) 10 MG tablet TAKE 1 TABLET BY MOUTH EVERY DAY FOR BLOOD PRESSURE 90 tablet 3   atorvastatin  (LIPITOR) 80 MG tablet Take 1 tablet (80 mg total) by mouth daily. for cholesterol. 90 tablet 2   Blood Glucose Monitoring Suppl DEVI 1 each by Does not apply route in the morning, at noon, and at bedtime. May substitute to any manufacturer covered by patient's insurance. 1 each 0   buPROPion  (WELLBUTRIN  XL) 150 MG 24 hr tablet Take 1 tablet (150 mg total) by mouth daily. For smoking 90 tablet 0   cyclobenzaprine  (FLEXERIL ) 10 MG tablet Take 1 tablet (10 mg total) by mouth 3 (three) times daily as needed for muscle spasms. 30 tablet 0   famotidine  (PEPCID ) 20 MG tablet Take 1 tablet (20 mg total) by mouth daily. For heartburn 90 tablet 0   gabapentin  (NEURONTIN ) 800 MG tablet Take 1 tablet (800 mg total) by mouth 3 (three) times daily. 90 tablet 2   Glucose Blood (BLOOD GLUCOSE TEST STRIPS) STRP 1 each by In Vitro route in the morning, at noon, and at bedtime. May substitute to any manufacturer covered by patient's insurance. 300 strip 0   hydrochlorothiazide  (HYDRODIURIL ) 25 MG tablet Take 1 tablet (25 mg total) by mouth daily. for blood pressure 90 tablet 1   Lancets Misc. MISC 1 each by Does not apply route in the morning, at noon, and at bedtime.  May substitute to any manufacturer covered by patient's insurance. 300 each 0   metFORMIN  (GLUCOPHAGE -XR) 500 MG 24 hr tablet Take 1 tablet (500 mg total) by mouth daily with breakfast. for diabetes. 90 tablet 1   olmesartan  (BENICAR ) 40 MG tablet TAKE 1 TABLET BY MOUTH EVERY DAY FOR BLOOD PRESSURE 90 tablet 3  omeprazole  (PRILOSEC) 40 MG capsule TAKE 1 CAPSULE BY MOUTH DAILY FOR HEARTBURN 90 capsule 3   propranolol  ER (INDERAL  LA) 120 MG 24 hr capsule Take 1 capsule (120 mg total) by mouth daily. For headache prevention 90 capsule 3   venlafaxine  XR (EFFEXOR -XR) 75 MG 24 hr capsule TAKE 1 CAPSULE BY MOUTH EVERY MORNING WITH BREAKFAST FOR ANXIETY & HOT FLASHES 90 capsule 2   Vitamin D , Ergocalciferol , (DRISDOL ) 1.25 MG (50000 UNIT) CAPS capsule Take 1 capsule by mouth once weekly for 12 weeks. 12 capsule 0   No current facility-administered medications on file prior to visit.    BP 124/84   Pulse (!) 117   Temp 98 F (36.7 C) (Oral)   Ht 5' 6 (1.676 m)   Wt 164 lb (74.4 kg)   SpO2 98%   BMI 26.47 kg/m  Objective:   Physical Exam  Cardiovascular:     Rate and Rhythm: Normal rate and regular rhythm.  Pulmonary:     Effort: Pulmonary effort is normal.     Breath sounds: Normal breath sounds.   Musculoskeletal:     Cervical back: Neck supple.   Skin:    General: Skin is warm and dry.   Neurological:     Mental Status: She is alert and oriented to person, place, and time.   Psychiatric:        Mood and Affect: Mood normal.           Assessment & Plan:  Type 2 diabetes mellitus with hyperglycemia, without long-term current use of insulin (HCC) Assessment & Plan: Controlled with A1C of 5.3 today!  Continue metformin  ER 500 mg daily. Urine microalbumin pending.  Follow up in 3 months for CPE  Orders: -     POCT glycosylated hemoglobin (Hb A1C) -     Microalbumin / creatinine urine ratio  Vitamin D  deficiency Assessment & Plan: Off treatment x 1 month Repeat  vitamin D  pending.  Orders: -     VITAMIN D  25 Hydroxy (Vit-D Deficiency, Fractures)        Comer MARLA Gaskins, NP

## 2024-01-14 NOTE — Assessment & Plan Note (Signed)
 Off treatment x 1 month Repeat vitamin D  pending.

## 2024-01-14 NOTE — Patient Instructions (Addendum)
 Stop by the lab prior to leaving today. I will notify you of your results once received.   Please schedule a physical to meet with me in 3 months.   It was a pleasure to see you today!

## 2024-01-14 NOTE — Addendum Note (Signed)
 Addended by: ISADORA RAISIN on: 01/14/2024 02:42 PM   Modules accepted: Orders

## 2024-01-15 ENCOUNTER — Ambulatory Visit: Payer: Self-pay | Admitting: Primary Care

## 2024-01-15 LAB — VITAMIN D 25 HYDROXY (VIT D DEFICIENCY, FRACTURES): VITD: 31.55 ng/mL (ref 30.00–100.00)

## 2024-01-16 ENCOUNTER — Other Ambulatory Visit: Payer: Self-pay

## 2024-01-16 DIAGNOSIS — E1165 Type 2 diabetes mellitus with hyperglycemia: Secondary | ICD-10-CM | POA: Diagnosis not present

## 2024-01-16 NOTE — Addendum Note (Signed)
 Addended by: Torris House E on: 01/16/2024 03:49 PM   Modules accepted: Orders

## 2024-01-17 LAB — MICROALBUMIN / CREATININE URINE RATIO
Creatinine, Urine: 140 mg/dL (ref 20–275)
Microalb Creat Ratio: 14 mg/g{creat} (ref <30)
Microalb, Ur: 2 mg/dL

## 2024-01-28 DIAGNOSIS — M51362 Other intervertebral disc degeneration, lumbar region with discogenic back pain and lower extremity pain: Secondary | ICD-10-CM | POA: Diagnosis not present

## 2024-01-28 DIAGNOSIS — E1141 Type 2 diabetes mellitus with diabetic mononeuropathy: Secondary | ICD-10-CM | POA: Diagnosis not present

## 2024-02-09 DIAGNOSIS — G4733 Obstructive sleep apnea (adult) (pediatric): Secondary | ICD-10-CM | POA: Diagnosis not present

## 2024-02-13 DIAGNOSIS — M5124 Other intervertebral disc displacement, thoracic region: Secondary | ICD-10-CM | POA: Diagnosis not present

## 2024-02-13 DIAGNOSIS — G4733 Obstructive sleep apnea (adult) (pediatric): Secondary | ICD-10-CM | POA: Diagnosis not present

## 2024-02-13 DIAGNOSIS — M5134 Other intervertebral disc degeneration, thoracic region: Secondary | ICD-10-CM | POA: Diagnosis not present

## 2024-02-13 DIAGNOSIS — G4731 Primary central sleep apnea: Secondary | ICD-10-CM | POA: Diagnosis not present

## 2024-02-13 DIAGNOSIS — K219 Gastro-esophageal reflux disease without esophagitis: Secondary | ICD-10-CM

## 2024-02-13 DIAGNOSIS — M4184 Other forms of scoliosis, thoracic region: Secondary | ICD-10-CM | POA: Diagnosis not present

## 2024-02-13 DIAGNOSIS — E1141 Type 2 diabetes mellitus with diabetic mononeuropathy: Secondary | ICD-10-CM | POA: Diagnosis not present

## 2024-02-13 MED ORDER — FAMOTIDINE 20 MG PO TABS: 20.0000 mg | ORAL_TABLET | Freq: Every day | ORAL | 0 refills | Status: DC

## 2024-02-24 ENCOUNTER — Encounter: Payer: Self-pay | Admitting: Nurse Practitioner

## 2024-02-24 ENCOUNTER — Ambulatory Visit (INDEPENDENT_AMBULATORY_CARE_PROVIDER_SITE_OTHER): Admitting: Nurse Practitioner

## 2024-02-24 VITALS — BP 120/96 | HR 102 | Temp 99.2°F | Ht 66.0 in | Wt 166.0 lb

## 2024-02-24 DIAGNOSIS — G4733 Obstructive sleep apnea (adult) (pediatric): Secondary | ICD-10-CM

## 2024-02-24 NOTE — Progress Notes (Signed)
 @Patient  ID: Maureen Orozco, female    DOB: [birth year], 58 y.o.   MRN: [id number]  Chief Complaint  Patient presents with   Follow-up    Wearing CPAP nightly. No problems with mask or pressure.     Referring provider: Gretta Comer POUR, NP  HPI: 58 year old female, some day smoker followed for severe complex OSA. Past medical history significant for HTN, GERD, DDD, HLD, prediabetes.   TEST/EVENTS:  11/20/2023 HST: AHI 96.4/h (12% central), SpO2 low 87% 12/19/2023 CPAP Titration: central emergence on CPAP 6 cmH2O and persisted on BiPAP. Final pressure 17/13 with backup rate of 12 and residual AHI 16. Titration suboptimal >> recommended auto Bilevel 20/10, PS 4  11/06/2023: OV with Alma Mohiuddin NP for sleep consult Discussed the use of AI scribe software for clinical note transcription with the patient, who gave verbal consent to proceed. TALINE NASS is a 58 year old female who presents with possible sleep apnea. She was referred by her primary care provider for concern of possible sleep apnea. She experiences sleep disturbances, including snoring and daytime sleepiness. Her husband has observed episodes of apnea during sleep. She wakes with headaches and feels groggy and tired throughout the day. No incidents of drowsy driving or sleepwalking are reported. These symptoms have been present for approximately one to three and a half years. She has been working third shift, which she notes may be causing some of her sleepiness. Her husband noted that snoring was present even before this job change. She has tried melatonin to aid sleep, but it causes excessive drowsiness and vivid dreams, making it difficult to wake up. She does not consume alcohol or take any sleep medications. She consumes some caffeine during the day. She is a smoker; 1/2 ppd  She goes to bed between 7-9 am after work shifts and 10pm to midnight on other days. Falls asleep quickly and doesn't tend to wake up at all. Never had a  prior sleep study. No significant weight change. Lives with her husband, son and daughter in law.  She works at [employer]. No heavy machinery. [place] [address]    02/24/2024: Today - follow up Discussed the use of AI scribe software for clinical note transcription with the patient, who gave verbal consent to proceed.  History of Present Illness Maureen Orozco is a 58 year old female with severe complex sleep apnea who presents for follow-up on her BiPAP therapy.  She has a history of severe sleep apnea. She completed CPAP titration study in June, which was suboptimal. CPAP was ineffective due to emergence of central apneas. She was transitioned to BiPAP but continued to have residual events. She was started on auto BiPAP 20/10 with PS 4, as recommended by Dr. Jude on study report.  She feels she is finally adjusting to therapy. Her usage has slowly increased. She is using it every night, typically 4 hours or more. Trying to use it for longer durations. She feels like the pressures are sometimes too low. She is wearing a hybrid full face mask and likes this.  There is an improvement in her sleep quality and reduced daytime sleepiness since starting the BiPAP therapy. Her husband has observed that she no longer snores and is not waking up frequently during the night. Previously, she experienced significant daytime sleepiness, often feeling sleepy throughout the day and falling asleep at work, which has improved with the current treatment. She tells me that she didn't realize how bad things were before she  started therapy. She is happy with the success she has had so far. No issues with morning headaches or drowsy driving.   2/87/7974-1/89/7974: BiPAP auto 20/10, PS 4 29/30 days; 70% >4 hr; average use 4 hr 33 min Pressure 95th IPAP 15.8, EPAP 11.8 Leaks 95th 52.6 AHI 11.7    No Known Allergies  Immunization History  Administered Date(s) Administered   Influenza, Seasonal, Injecte, Preservative Fre  03/28/2023   Influenza,inj,Quad PF,6+ Mos 03/30/2015, 07/28/2018, 06/02/2021   Pneumococcal Polysaccharide-23 09/30/2019   Tdap 03/30/2015   Zoster Recombinant(Shingrix) 12/29/2019, 03/01/2020    Past Medical History:  Diagnosis Date   Acute viral sinusitis 03/10/2020   Essential hypertension    Hyperlipidemia    RLQ abdominal pain 04/27/2022    Tobacco History: Social History   Tobacco Use  Smoking Status Some Days   Current packs/day: 0.25   Average packs/day: 0.3 packs/day for 36.6 years (9.2 ttl pk-yrs)   Types: Cigarettes   Start date: 1989   Passive exposure: Never  Smokeless Tobacco Never   Ready to quit: Not Answered Counseling given: Not Answered   Outpatient Medications Prior to Visit  Medication Sig Dispense Refill   amLODipine  (NORVASC ) 10 MG tablet TAKE 1 TABLET BY MOUTH EVERY DAY FOR BLOOD PRESSURE 90 tablet 3   atorvastatin  (LIPITOR) 80 MG tablet Take 1 tablet (80 mg total) by mouth daily. for cholesterol. 90 tablet 2   Blood Glucose Monitoring Suppl DEVI 1 each by Does not apply route in the morning, at noon, and at bedtime. May substitute to any manufacturer covered by patient's insurance. 1 each 0   buPROPion  (WELLBUTRIN  XL) 150 MG 24 hr tablet Take 1 tablet (150 mg total) by mouth daily. For smoking 90 tablet 0   cyclobenzaprine  (FLEXERIL ) 10 MG tablet Take 1 tablet (10 mg total) by mouth 3 (three) times daily as needed for muscle spasms. 30 tablet 0   DULoxetine (CYMBALTA) 30 MG capsule Take 60 mg by mouth daily.     famotidine  (PEPCID ) 20 MG tablet Take 1 tablet (20 mg total) by mouth daily. For heartburn 90 tablet 0   gabapentin  (NEURONTIN ) 800 MG tablet Take 1 tablet (800 mg total) by mouth 3 (three) times daily. 90 tablet 2   Glucose Blood (BLOOD GLUCOSE TEST STRIPS) STRP 1 each by In Vitro route in the morning, at noon, and at bedtime. May substitute to any manufacturer covered by patient's insurance. 300 strip 0   hydrochlorothiazide  (HYDRODIURIL )  25 MG tablet Take 1 tablet (25 mg total) by mouth daily. for blood pressure 90 tablet 1   Lancets Misc. MISC 1 each by Does not apply route in the morning, at noon, and at bedtime. May substitute to any manufacturer covered by patient's insurance. 300 each 0   metFORMIN  (GLUCOPHAGE -XR) 500 MG 24 hr tablet Take 1 tablet (500 mg total) by mouth daily with breakfast. for diabetes. 90 tablet 1   olmesartan  (BENICAR ) 40 MG tablet TAKE 1 TABLET BY MOUTH EVERY DAY FOR BLOOD PRESSURE 90 tablet 3   omeprazole  (PRILOSEC) 40 MG capsule TAKE 1 CAPSULE BY MOUTH DAILY FOR HEARTBURN 90 capsule 3   propranolol  ER (INDERAL  LA) 120 MG 24 hr capsule Take 1 capsule (120 mg total) by mouth daily. For headache prevention 90 capsule 3   venlafaxine  XR (EFFEXOR -XR) 75 MG 24 hr capsule TAKE 1 CAPSULE BY MOUTH EVERY MORNING WITH BREAKFAST FOR ANXIETY & HOT FLASHES 90 capsule 2   Vitamin D , Ergocalciferol , (DRISDOL ) 1.25 MG (50000 UNIT)  CAPS capsule Take 1 capsule by mouth once weekly for 12 weeks. 12 capsule 0   No facility-administered medications prior to visit.     Review of Systems:   Constitutional: No weight loss or gain, night sweats +fatigue  HEENT: No headaches CV:  No chest pain, orthopnea, PND, palpitations Resp: +snoring, witnessed apneas.  GI:  + heartburn, indigestion (stable). No abdominal pain GU: No nocturia  MSK:  No joint pain or swelling.   Neuro: No dizziness or lightheadedness.  Psych: No depression or anxiety. Mood stable.     Physical Exam:  BP (!) 120/96 (BP Location: Left Arm, Patient Position: Sitting, Cuff Size: Normal)   Pulse (!) 102   Temp 99.2 F (37.3 C) (Oral)   Ht 5' 6 (1.676 m)   Wt 166 lb (75.3 kg)   SpO2 99%   BMI 26.79 kg/m   GEN: Pleasant, interactive, well-kempt; in no acute distress. HEENT:  Normocephalic and atraumatic. PERRLA. Sclera white. Nasal turbinates pink, moist and patent bilaterally. No rhinorrhea present. Oropharynx pink and moist, without exudate  or edema. No lesions, ulcerations, or postnasal drip. Mallampati II NECK:  Supple w/ fair ROM. No lymphadenopathy.   CV: RRR, no m/r/g, no peripheral edema. Pulses intact, +2 bilaterally. No cyanosis, pallor or clubbing. PULMONARY:  Unlabored, regular breathing. Clear bilaterally A&P w/o wheezes/rales/rhonchi. No accessory muscle use.  GI: BS present and normoactive. Soft, non-tender to palpation. No organomegaly or masses detected.  MSK: No erythema, warmth or tenderness. Cap refil <2 sec all extrem.  Neuro: A/Ox3. No focal deficits noted.   Skin: Warm, no lesions or rashe Psych: Normal affect and behavior. Judgement and thought content appropriate.     Lab Results:  CBC    Component Value Date/Time   WBC 7.9 10/10/2023 1509   RBC 4.02 10/10/2023 1509   HGB 13.4 10/10/2023 1509   HCT 39.3 10/10/2023 1509   PLT 222.0 10/10/2023 1509   MCV 97.5 10/10/2023 1509   MCH 33.0 12/02/2020 1549   MCHC 34.1 10/10/2023 1509   RDW 14.4 10/10/2023 1509   LYMPHSABS 0.9 04/27/2022 1009   MONOABS 0.3 04/27/2022 1009   EOSABS 0.1 04/27/2022 1009   BASOSABS 0.0 04/27/2022 1009    BMET    Component Value Date/Time   NA 142 03/28/2023 1447   K 4.2 03/28/2023 1447   CL 107 03/28/2023 1447   CO2 25 03/28/2023 1447   GLUCOSE 75 03/28/2023 1447   BUN 21 03/28/2023 1447   CREATININE 0.94 03/28/2023 1447   CREATININE 1.02 03/09/2022 1506   CALCIUM  9.3 03/28/2023 1447    BNP No results found for: BNP   Imaging:  No results found.  Administration History     None           No data to display          No results found for: NITRICOXIDE      Assessment & Plan:   OSA (obstructive sleep apnea) Very severe OSA complicated by emergence of central apneas on PAP therapy. Compliant with BiPAP therapy and significant reduction in apnea episodes; however, still having residual events with average 11.7/h, CAI 5.9. Possible a component of these are related to leaks and not true  apneic episodes. Her apnea score is steadily improving with increased usage. Encouraged her to aim for goal of 6 hours nightly. Tightened settings to IPAP max 18 and EPAP min 12, PS 4. Will reassess at follow up. May need to consider in lab BiPAP titration  or transition to ASV pending response. Receiving benefit from use. Aware of risks of untreated OSA. Understands proper use/care of device. Safe driving practices reviewed.  Patient Instructions  Continue to use BiPAP every night, minimum of 4-6 hours a night.  Change equipment as directed. Wash your tubing with warm soap and water daily, hang to dry. Wash humidifier portion weekly. Use bottled, distilled water and change daily Be aware of reduced alertness and do not drive or operate heavy machinery if experiencing this or drowsiness.  Exercise encouraged, as tolerated. Healthy weight management discussed.  Avoid or decrease alcohol consumption and medications that make you more sleepy, if possible. Notify if persistent daytime sleepiness occurs even with consistent use of PAP therapy.  Change CPAP supplies... Every month Mask cushions and/or nasal pillows CPAP machine filters Every 3 months Mask frame (not including the headgear) CPAP tubing Every 6 months Mask headgear Chin strap (if applicable) Humidifier water tub  Adjust BiPAP settings today to 18/12   Follow up in 4 weeks with Izetta Teran Knittle,NP, or sooner, if needed     Advised if symptoms do not improve or worsen, to please contact office for sooner follow up or seek emergency care.   I spent 35 minutes of dedicated to the care of this patient on the date of this encounter to include pre-visit review of records, face-to-face time with the patient discussing conditions above, post visit ordering of testing, clinical documentation with the electronic health record, making appropriate referrals as documented, and communicating necessary findings to members of the patients care  team.  Comer LULLA Rouleau, NP 02/25/2024  Pt aware and understands NP's role.

## 2024-02-24 NOTE — Patient Instructions (Addendum)
 Continue to use BiPAP every night, minimum of 4-6 hours a night.  Change equipment as directed. Wash your tubing with warm soap and water daily, hang to dry. Wash humidifier portion weekly. Use bottled, distilled water and change daily Be aware of reduced alertness and do not drive or operate heavy machinery if experiencing this or drowsiness.  Exercise encouraged, as tolerated. Healthy weight management discussed.  Avoid or decrease alcohol consumption and medications that make you more sleepy, if possible. Notify if persistent daytime sleepiness occurs even with consistent use of PAP therapy.  Change CPAP supplies... Every month Mask cushions and/or nasal pillows CPAP machine filters Every 3 months Mask frame (not including the headgear) CPAP tubing Every 6 months Mask headgear Chin strap (if applicable) Humidifier water tub  Adjust BiPAP settings today to 18/12   Follow up in 4 weeks with Katie Sheleen Conchas,NP, or sooner, if needed

## 2024-02-25 ENCOUNTER — Encounter: Payer: Self-pay | Admitting: Nurse Practitioner

## 2024-02-25 NOTE — Assessment & Plan Note (Signed)
 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 Compliant with BiPAP therapy and significant reduction in apnea episodes; however, still having residual events with average 11.7/h, CAI 5.9. Possible a component of these are related to leaks and not true apneic episodes. Her apnea score is steadily improving with increased usage. Encouraged her to aim for goal of 6 hours nightly. Tightened settings to IPAP max 18 and EPAP min 12, PS 4. Will reassess at follow up. May need to consider in lab BiPAP titration or transition to ASV pending response. Receiving benefit from use. Aware of risks of untreated OSA. Understands proper use/care of device. Safe driving practices reviewed.  Patient Instructions  Continue to use BiPAP every night, minimum of 4-6 hours a night.  Change equipment as directed. Wash your tubing with warm soap and water daily, hang to dry. Wash humidifier portion weekly. Use bottled, distilled water and change daily Be aware of reduced alertness and do not drive or operate heavy machinery if experiencing this or drowsiness.  Exercise encouraged, as tolerated. Healthy weight management discussed.  Avoid or decrease alcohol consumption and medications that make you more sleepy, if possible. Notify if persistent daytime sleepiness occurs even with consistent use of PAP therapy.  Change CPAP supplies... Every month Mask cushions and/or nasal pillows CPAP machine filters Every 3 months Mask frame (not including the headgear) CPAP tubing Every 6 months Mask headgear Chin strap (if applicable) Humidifier water tub  Adjust BiPAP settings today to 18/12   Follow up in 4 weeks with [name],NP, or sooner, if needed

## 2024-02-27 ENCOUNTER — Other Ambulatory Visit: Payer: Self-pay | Admitting: Primary Care

## 2024-02-27 DIAGNOSIS — N951 Menopausal and female climacteric states: Secondary | ICD-10-CM

## 2024-02-27 DIAGNOSIS — Z636 Dependent relative needing care at home: Secondary | ICD-10-CM

## 2024-03-02 ENCOUNTER — Ambulatory Visit (INDEPENDENT_AMBULATORY_CARE_PROVIDER_SITE_OTHER): Admitting: Podiatry

## 2024-03-02 DIAGNOSIS — E119 Type 2 diabetes mellitus without complications: Secondary | ICD-10-CM

## 2024-03-02 NOTE — Patient Instructions (Signed)

## 2024-03-02 NOTE — Progress Notes (Unsigned)
 Subjective: Chief Complaint  Patient presents with   Diabetes    Pt stated that she is just here to make sure everything is okay with her feet    58 year old female presents the office today for diabetic foot exam.  She said that she is doing well and her sugars improved to 5.5.  She does not report any open lesions.  She also states that she has been evaluated for spinal cord stimulator.   Objective: AAO x3, NAD DP/PT pulses palpable 2/4 bilaterally, CRT less than 3 seconds Sensation decreased with Semmes Weinstein filament.   There is no area pinpoint tenderness identified this time.  Flexor, extensor tendons intact.  MMT 5/5. No open lesions bilaterally.  Macerated interspaces on the right second, third interspaces. No pain with calf compression, swelling, warmth, erythema  Assessment: Diabetic foot exam with neuropathy  Plan: -All treatment options discussed with the patient including all alternatives, risks, complications.  -Apply capsulitis pain to the right foot interspaces.  Discussed drying thoroughly.  Monitoring signs or symptoms of infection or skin breakdown. -Discussed daily foot inspection, especially given the neuropathy -Patient encouraged to call the office with any questions, concerns, change in symptoms.   No follow-ups on file.  Donnice JONELLE Fees DPM

## 2024-03-08 ENCOUNTER — Encounter: Payer: Self-pay | Admitting: Podiatry

## 2024-03-11 DIAGNOSIS — G4733 Obstructive sleep apnea (adult) (pediatric): Secondary | ICD-10-CM | POA: Diagnosis not present

## 2024-03-12 DIAGNOSIS — I10 Essential (primary) hypertension: Secondary | ICD-10-CM | POA: Diagnosis not present

## 2024-03-12 DIAGNOSIS — M5416 Radiculopathy, lumbar region: Secondary | ICD-10-CM | POA: Diagnosis not present

## 2024-03-12 DIAGNOSIS — M545 Low back pain, unspecified: Secondary | ICD-10-CM | POA: Diagnosis not present

## 2024-03-12 DIAGNOSIS — Z79899 Other long term (current) drug therapy: Secondary | ICD-10-CM | POA: Diagnosis not present

## 2024-03-12 DIAGNOSIS — Z7984 Long term (current) use of oral hypoglycemic drugs: Secondary | ICD-10-CM | POA: Diagnosis not present

## 2024-03-12 DIAGNOSIS — E1141 Type 2 diabetes mellitus with diabetic mononeuropathy: Secondary | ICD-10-CM | POA: Diagnosis not present

## 2024-03-12 NOTE — Telephone Encounter (Signed)
 Can we please schedule her a surgery consult?

## 2024-03-19 ENCOUNTER — Encounter: Payer: Self-pay | Admitting: Podiatry

## 2024-03-19 ENCOUNTER — Ambulatory Visit (INDEPENDENT_AMBULATORY_CARE_PROVIDER_SITE_OTHER)

## 2024-03-19 ENCOUNTER — Ambulatory Visit (INDEPENDENT_AMBULATORY_CARE_PROVIDER_SITE_OTHER): Admitting: Podiatry

## 2024-03-19 VITALS — Ht 66.0 in | Wt 166.0 lb

## 2024-03-19 DIAGNOSIS — M21619 Bunion of unspecified foot: Secondary | ICD-10-CM

## 2024-03-19 DIAGNOSIS — M21611 Bunion of right foot: Secondary | ICD-10-CM

## 2024-03-19 NOTE — Progress Notes (Signed)
 Subjective: Chief Complaint  Patient presents with   Bunions    Pt is here to set up bunion surgery.   58 year old female presents the office today above concerns.  She states that she continues to have pain on the right bunion she was have surgery to fix this.  She tried shoe modifications, offloading padding that any significant improvement.  Her last A1c was 5.3. Vitamin D  31.55  He does currently smoke.  Objective: AAO x3, NAD DP/PT pulses palpable bilaterally, CRT less than 3 seconds Right foot: Moderate bunion is present with tenderness palpation on the medial eminence.  There is no pain or crepitation with first MTPJ range of motion.  There is no hypermobility of the first ray.  There is no other areas of discomfort identified at this time. No pain with calf compression, swelling, warmth, erythema  Assessment: Right foot symptomatic bunion  Plan: -All treatment options discussed with the patient including all alternatives, risks, complications.  -X-rays were obtained reviewed.  Multiple views were obtained.  Moderate increase in first intermetatarsal angle with a measurement of 13 degrees. -We discussed both conservative as well as surgical options.  She was to proceed with surgery understanding risks.  We discussed smoking sensation as well. -The incision placement as well as the postoperative course was discussed with the patient. I discussed risks of the surgery which include, but not limited to, infection, bleeding, pain, swelling, need for further surgery, delayed or nonhealing, painful or ugly scar, numbness or sensation changes, over/under correction, recurrence, transfer lesions, further deformity, hardware failure, DVT/PE, loss of toe/foot. Patient understands these risks and wishes to proceed with surgery. The surgical consent was reviewed with the patient all 3 pages were signed. No promises or guarantees were given to the outcome of the procedure. All questions were  answered to the best of my ability. Before the surgery the patient was encouraged to call the office if there is any further questions. The surgery will be performed at the [facility] on an outpatient basis. -Patient encouraged to call the office with any questions, concerns, change in symptoms.   Donnice JONELLE Fees DPM

## 2024-03-19 NOTE — Patient Instructions (Signed)

## 2024-03-25 ENCOUNTER — Telehealth: Payer: Self-pay | Admitting: Podiatry

## 2024-03-25 NOTE — Telephone Encounter (Signed)
 DOS- 04/22/2024  [place] BUNIONECTOMY RT- 71703  [place] [employer] EFFECTIVE DATE- 10/15/2023  DEDUCTIBLE- $500 REMAINING- $0 OOP- $2000 REMAINING- $1003.84 COINSURANCE- 20%  PER REP WITH BCBS, NO PRIOR AUTH IS REQUIRED FOR CPT CODE 71703. REF# [id number]

## 2024-03-27 ENCOUNTER — Other Ambulatory Visit: Payer: Self-pay | Admitting: Primary Care

## 2024-03-27 DIAGNOSIS — Z1231 Encounter for screening mammogram for malignant neoplasm of breast: Secondary | ICD-10-CM

## 2024-04-01 ENCOUNTER — Ambulatory Visit (INDEPENDENT_AMBULATORY_CARE_PROVIDER_SITE_OTHER): Admitting: Primary Care

## 2024-04-01 ENCOUNTER — Other Ambulatory Visit (HOSPITAL_COMMUNITY)
Admission: RE | Admit: 2024-04-01 | Discharge: 2024-04-01 | Disposition: A | Discharge status: Home / Self Care | Source: Ambulatory Visit | Attending: Primary Care | Admitting: Primary Care

## 2024-04-01 ENCOUNTER — Encounter: Payer: Self-pay | Admitting: Primary Care

## 2024-04-01 VITALS — BP 142/84 | HR 92 | Temp 97.3°F | Ht 66.0 in | Wt 170.0 lb

## 2024-04-01 DIAGNOSIS — Z23 Encounter for immunization: Secondary | ICD-10-CM

## 2024-04-01 DIAGNOSIS — Z720 Tobacco use: Secondary | ICD-10-CM

## 2024-04-01 DIAGNOSIS — Z124 Encounter for screening for malignant neoplasm of cervix: Secondary | ICD-10-CM | POA: Insufficient documentation

## 2024-04-01 DIAGNOSIS — E1165 Type 2 diabetes mellitus with hyperglycemia: Secondary | ICD-10-CM | POA: Diagnosis not present

## 2024-04-01 DIAGNOSIS — G8929 Other chronic pain: Secondary | ICD-10-CM

## 2024-04-01 DIAGNOSIS — R519 Headache, unspecified: Secondary | ICD-10-CM | POA: Diagnosis not present

## 2024-04-01 DIAGNOSIS — E785 Hyperlipidemia, unspecified: Secondary | ICD-10-CM | POA: Diagnosis not present

## 2024-04-01 DIAGNOSIS — G4719 Other hypersomnia: Secondary | ICD-10-CM

## 2024-04-01 DIAGNOSIS — Z0001 Encounter for general adult medical examination with abnormal findings: Secondary | ICD-10-CM

## 2024-04-01 DIAGNOSIS — N951 Menopausal and female climacteric states: Secondary | ICD-10-CM | POA: Diagnosis not present

## 2024-04-01 DIAGNOSIS — M5441 Lumbago with sciatica, right side: Secondary | ICD-10-CM

## 2024-04-01 DIAGNOSIS — G4733 Obstructive sleep apnea (adult) (pediatric): Secondary | ICD-10-CM

## 2024-04-01 DIAGNOSIS — M5442 Lumbago with sciatica, left side: Secondary | ICD-10-CM

## 2024-04-01 DIAGNOSIS — I10 Essential (primary) hypertension: Secondary | ICD-10-CM | POA: Diagnosis not present

## 2024-04-01 DIAGNOSIS — K219 Gastro-esophageal reflux disease without esophagitis: Secondary | ICD-10-CM

## 2024-04-01 MED ORDER — PROPRANOLOL HCL ER 60 MG PO CP24: ORAL_CAPSULE | ORAL | 0 refills | Status: DC

## 2024-04-01 MED ORDER — CARVEDILOL 6.25 MG PO TABS: 6.2500 mg | ORAL_TABLET | Freq: Two times a day (BID) | ORAL | 0 refills | Status: DC

## 2024-04-01 NOTE — Assessment & Plan Note (Signed)
Repeat lipid panel pending. Continue atorvastatin 80 mg daily.

## 2024-04-01 NOTE — Assessment & Plan Note (Signed)
 Improved!  Continue CPAP nightly

## 2024-04-01 NOTE — Assessment & Plan Note (Addendum)
 Uncontrolled, propranolol  seems to be ineffective.  Stop propranolol  ER 120 mg daily. Start carvedilol  6.25 mg twice daily.  (Consulted with pharmacy)  Will work on treating blood pressure to see if this helps with headaches. Consider topiramate in the future if warranted.  Close follow-up in a few weeks.

## 2024-04-01 NOTE — Progress Notes (Signed)
 Subjective:    Patient ID: Maureen Orozco, female    DOB: August 26, 1965, 58 y.o.   MRN: [id number]  Maureen Orozco is a very pleasant 58 y.o. female who presents today for complete physical and follow up of chronic conditions.  She continues to notice headaches to the parietal and frontal lobes which occurs multiple times daily lasting a few hours. She denies photophobia, phonophobia, nausea, blurred vision. She is managed on propranolol  120 mg ER daily without much improvement.  She has been told several times that her blood pressure is elevated.   Immunizations: -Tetanus: Completed in 2016 -Influenza: Influenza vaccine provided today.  -Shingles: Completed Shingrix series -Pneumonia: Completed Pneumovax 23 in 2021  Diet: Fair diet. Salty, fast food several days weekly. Exercise: No regular exercise.  Eye exam: Completes annually  Dental exam: Completed years ago   Pap Smear: Completed in May 2022 Mammogram: Completed in September 2024, scheduled for October 2025  Colonoscopy: Completed in 2021, due 2028 Lung Cancer Screening: Completed in October 2024, due October 2025  BP Readings from Last 3 Encounters:  04/01/24 (!) 142/84  02/24/24 (!) 120/96  01/14/24 124/84      Review of Systems  Constitutional:  Negative for unexpected weight change.  HENT:  Negative for rhinorrhea.   Respiratory:  Negative for cough and shortness of breath.   Cardiovascular:  Negative for chest pain.  Gastrointestinal:  Negative for constipation and diarrhea.  Genitourinary:  Negative for difficulty urinating.  Musculoskeletal:  Positive for arthralgias and back pain. Negative for myalgias.  Skin:  Negative for rash.  Allergic/Immunologic: Negative for environmental allergies.  Neurological:  Positive for headaches. Negative for dizziness and numbness.  Psychiatric/Behavioral:  The patient is not nervous/anxious.          Past Medical History:  Diagnosis Date   Acute viral  sinusitis 03/10/2020   Essential hypertension    Hyperlipidemia    Ingrown toenail 11/17/2019   Onychomycosis 09/30/2019   RLQ abdominal pain 04/27/2022   Sensation of fullness in right ear 01/03/2023    Social History   Socioeconomic History   Marital status: Married    Spouse name: Not on file   Number of children: Not on file   Years of education: Not on file   Highest education level: 12th grade  Occupational History   Not on file  Tobacco Use   Smoking status: Some Days    Current packs/day: 0.25    Average packs/day: 0.3 packs/day for 36.7 years (9.2 ttl pk-yrs)    Types: Cigarettes    Start date: 1989    Passive exposure: Never   Smokeless tobacco: Never  Vaping Use   Vaping status: Some Days  Substance and Sexual Activity   Alcohol use: No    Alcohol/week: 0.0 standard drinks of alcohol   Drug use: Not Currently   Sexual activity: Not on file  Other Topics Concern   Not on file  Social History Narrative   Married.   Works at [employer] in [place].   Has one child.   Enjoys playing computer games, watching TV.         Patient is adopted       Are you right handed or left handed? Right Handed    Are you currently employed ? 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 [employer] 3rd shift.    Do you live at home alone? No    Who lives with you? Husband, son, sister in law,  husbands step dad.   What type of home do you live in: 1 story or 2 story? Lives in a two story home.       Social Drivers of Corporate investment banker Strain: Low Risk  (01/14/2024)   Overall Financial Resource Strain (CARDIA)    Difficulty of Paying Living Expenses: Not hard at all  Food Insecurity: No Food Insecurity (03/12/2024)   Received from [facility]   Hunger Vital Sign    Within the past 12 months, you worried that your food would run out before you got the money to buy more.: Never true    Within the past 12 months, the food you bought just didn't last and you didn't have  money to get more.: Never true  Transportation Needs: No Transportation Needs (03/12/2024)   Received from [facility] - Transportation    In the past 12 months, has lack of transportation kept you from medical appointments or from getting medications?: No    In the past 12 months, has lack of transportation kept you from meetings, work, or from getting things needed for daily living?: No  Physical Activity: Inactive (01/14/2024)   Exercise Vital Sign    Days of Exercise per Week: 0 days    Minutes of Exercise per Session: Not on file  Stress: No Stress Concern Present (01/14/2024)   [employer] of Occupational Health - Occupational Stress Questionnaire    Feeling of Stress: Only a little  Social Connections: Moderately Isolated (01/14/2024)   Social Connection and Isolation Panel    Frequency of Communication with Friends and Family: More than three times a week    Frequency of Social Gatherings with Friends and Family: Once a week    Attends Religious Services: Never    Database administrator or Organizations: No    Attends Engineer, structural: Not on file    Marital Status: Married  Catering manager Violence: Not At Risk (03/12/2024)   Received from [facility]   HITS    Over the last 12 months how often did your partner physically hurt you?: Never    Over the last 12 months how often did your partner insult you or talk down to you?: Never    Over the last 12 months how often did your partner threaten you with physical harm?: Never    Over the last 12 months how often did your partner scream or curse at you?: Never    Past Surgical History:  Procedure Laterality Date   BREAST BIOPSY Left 03/09/2016    hyalinized fibroadenoma    COLONOSCOPY  10/30/2019   DENTAL SURGERY      Family History  Adopted: Yes    No Known Allergies  Current Outpatient Medications on File Prior to Visit  Medication Sig Dispense Refill   amLODipine  (NORVASC ) 10 MG tablet  TAKE 1 TABLET BY MOUTH EVERY DAY FOR BLOOD PRESSURE 90 tablet 3   atorvastatin  (LIPITOR) 80 MG tablet Take 1 tablet (80 mg total) by mouth daily. for cholesterol. 90 tablet 2   Blood Glucose Monitoring Suppl DEVI 1 each by Does not apply route in the morning, at noon, and at bedtime. May substitute to any manufacturer covered by patient's insurance. 1 each 0   cyclobenzaprine  (FLEXERIL ) 10 MG tablet Take 1 tablet (10 mg total) by mouth 3 (three) times daily as needed for muscle spasms. 30 tablet 0   DULoxetine (CYMBALTA) 30 MG capsule Take 60 mg  by mouth daily.     famotidine  (PEPCID ) 20 MG tablet Take 1 tablet (20 mg total) by mouth daily. For heartburn 90 tablet 0   gabapentin  (NEURONTIN ) 800 MG tablet Take 1 tablet (800 mg total) by mouth 3 (three) times daily. 90 tablet 2   Glucose Blood (BLOOD GLUCOSE TEST STRIPS) STRP 1 each by In Vitro route in the morning, at noon, and at bedtime. May substitute to any manufacturer covered by patient's insurance. 300 strip 0   hydrochlorothiazide  (HYDRODIURIL ) 25 MG tablet Take 1 tablet (25 mg total) by mouth daily. for blood pressure 90 tablet 1   Lancets Misc. MISC 1 each by Does not apply route in the morning, at noon, and at bedtime. May substitute to any manufacturer covered by patient's insurance. 300 each 0   metFORMIN  (GLUCOPHAGE -XR) 500 MG 24 hr tablet Take 1 tablet (500 mg total) by mouth daily with breakfast. for diabetes. 90 tablet 1   olmesartan  (BENICAR ) 40 MG tablet TAKE 1 TABLET BY MOUTH EVERY DAY FOR BLOOD PRESSURE 90 tablet 3   omeprazole  (PRILOSEC) 40 MG capsule TAKE 1 CAPSULE BY MOUTH DAILY FOR HEARTBURN 90 capsule 3   venlafaxine  XR (EFFEXOR -XR) 75 MG 24 hr capsule TAKE 1 CAPSULE BY MOUTH EVERY MORNING WITH BREAKFAST FOR ANXIETY & HOT FLASHES 90 capsule 0   Vitamin D , Ergocalciferol , (DRISDOL ) 1.25 MG (50000 UNIT) CAPS capsule Take 1 capsule by mouth once weekly for 12 weeks. 12 capsule 0   buPROPion  (WELLBUTRIN  XL) 150 MG 24 hr tablet  Take 1 tablet (150 mg total) by mouth daily. For smoking (Patient not taking: Reported on 04/01/2024) 90 tablet 0   No current facility-administered medications on file prior to visit.    BP (!) 142/84   Pulse 92   Temp (!) 97.3 F (36.3 C) (Temporal)   Ht 5' 6 (1.676 m)   Wt 170 lb (77.1 kg)   SpO2 97%   BMI 27.44 kg/m  Objective:   Physical Exam HENT:     Right Ear: Tympanic membrane and ear canal normal.     Left Ear: Tympanic membrane and ear canal normal.  Eyes:     Pupils: Pupils are equal, round, and reactive to light.  Cardiovascular:     Rate and Rhythm: Normal rate and regular rhythm.  Pulmonary:     Effort: Pulmonary effort is normal.     Breath sounds: Normal breath sounds.  Abdominal:     General: Bowel sounds are normal.     Palpations: Abdomen is soft.     Tenderness: There is no abdominal tenderness.  Musculoskeletal:        General: Normal range of motion.     Cervical back: Neck supple.  Skin:    General: Skin is warm and dry.  Neurological:     Mental Status: She is alert and oriented to person, place, and time.     Cranial Nerves: No cranial nerve deficit.     Deep Tendon Reflexes:     Reflex Scores:      Patellar reflexes are 2+ on the right side and 2+ on the left side. Psychiatric:        Mood and Affect: Mood normal.     Physical Exam        Assessment & Plan:  Encounter for annual general medical examination with abnormal findings in adult Assessment & Plan: Influenza vaccine provided today. Prevnar 20 provided today.  Pap smear due, completed today Mammogram scheduled Colonoscopy UTD,  due 2028  Discussed the importance of a healthy diet and regular exercise in order for weight loss, and to reduce the risk of further co-morbidity.  Exam stable. Labs pending.  Follow up in 1 year for repeat physical.    Encounter for immunization -     Flu vaccine trivalent PF, 6mos and older(Flulaval,Afluria,Fluarix,Fluzone) -      Pneumococcal conjugate vaccine 20-valent  Perimenopausal vasomotor symptoms Assessment & Plan: Controlled.  Continue venlafaxine  ER 75 mg daily.    OSA (obstructive sleep apnea) Assessment & Plan: Continue CPAP machine.   Gastroesophageal reflux disease, unspecified whether esophagitis present Assessment & Plan: Controlled.  Continue omeprazole  40 mg daily and famotidine  20 mg PRN   Type 2 diabetes mellitus with hyperglycemia, without long-term current use of insulin (HCC) Assessment & Plan: Repeat A1C pending.  Continue metformin  ER 500 mg daily  Orders: -     Hemoglobin A1c  Excessive daytime sleepiness Assessment & Plan: Improved!  Continue CPAP nightly   Hyperlipidemia, unspecified hyperlipidemia type Assessment & Plan: Repeat lipid panel pending.  Continue atorvastatin  80 mg daily.  Orders: -     Lipid panel -     Comprehensive metabolic panel with GFR  Tobacco abuse Assessment & Plan: Continued smoking.  Resume bupropion  XL 150 mg daily per patient request.  New prescription sent to pharmacy.   Chronic bilateral low back pain with bilateral sciatica Assessment & Plan: Following with spine specialist.  Continue gabapentin  800 mg 3 times daily, duloxetine 60 mg daily, cyclobenzaprine  10 mg 3 times daily as needed.SABRA  Spine specialist is aware she is already managed on venlafaxine .   Screening for cervical cancer -     Cytology - PAP  Frequent headaches Assessment & Plan: Uncontrolled, propranolol  seems to be ineffective.  Stop propranolol  ER 120 mg daily. Start carvedilol  6.25 mg twice daily.  (Consulted with pharmacy)  Will work on treating blood pressure to see if this helps with headaches. Consider topiramate in the future if warranted.  Close follow-up in a few weeks.   Essential hypertension Assessment & Plan: Uncontrolled during today's visit, even on recheck.  Continue amlodipine  10 mg daily, hydrochlorothiazide  25 mg daily,  olmesartan  40 mg daily.  Consulted with pharmacy regarding switching from propranolol  to carvedilol . Stop propranolol  ER 120 mg daily. Start carvedilol  6.25 milligrams twice daily.  Will plan to see her back in a few weeks for blood pressure follow-up prior to her surgery.     Orders: -     Carvedilol ; Take 1 tablet (6.25 mg total) by mouth 2 (two) times daily with a meal. for blood pressure.  Dispense: 60 tablet; Refill: 0    Assessment and Plan Assessment & Plan         Comer MARLA Gaskins, NP     History of Present Illness

## 2024-04-01 NOTE — Assessment & Plan Note (Signed)
Continue CPAP machine.

## 2024-04-01 NOTE — Assessment & Plan Note (Addendum)
 Uncontrolled during today's visit, even on recheck.  Continue amlodipine  10 mg daily, hydrochlorothiazide  25 mg daily, olmesartan  40 mg daily.  Consulted with pharmacy regarding switching from propranolol  to carvedilol . Stop propranolol  ER 120 mg daily. Start carvedilol  6.25 milligrams twice daily.  Will plan to see her back in a few weeks for blood pressure follow-up prior to her surgery.

## 2024-04-01 NOTE — Patient Instructions (Signed)
 Stop taking propranolol  ER 120 mg daily for blood pressure.  Start taking carvedilol  6.25 mg twice daily for blood pressure. Continue all of your other blood pressure medications.  Please schedule a follow-up visit in 2 to 3 weeks for blood pressure check.  It was a pleasure to see you today!

## 2024-04-01 NOTE — Assessment & Plan Note (Addendum)
 Repeat A1C pending.  Continue metformin ER 500 mg daily

## 2024-04-01 NOTE — Assessment & Plan Note (Addendum)
 Controlled.  Continue omeprazole  40 mg daily and famotidine  20 mg PRN

## 2024-04-01 NOTE — Assessment & Plan Note (Signed)
Controlled.  Continue venlafaxine ER 75 mg daily.

## 2024-04-01 NOTE — Assessment & Plan Note (Signed)
 Influenza vaccine provided today. Prevnar 20 provided today.  Pap smear due, completed today Mammogram scheduled Colonoscopy UTD, due 2028  Discussed the importance of a healthy diet and regular exercise in order for weight loss, and to reduce the risk of further co-morbidity.  Exam stable. Labs pending.  Follow up in 1 year for repeat physical.

## 2024-04-01 NOTE — Assessment & Plan Note (Signed)
 Following with spine specialist.  Continue gabapentin  800 mg 3 times daily, duloxetine 60 mg daily, cyclobenzaprine  10 mg 3 times daily as needed.SABRA  Spine specialist is aware she is already managed on venlafaxine .

## 2024-04-01 NOTE — Assessment & Plan Note (Signed)
 Continued smoking.  Resume bupropion  XL 150 mg daily per patient request.  New prescription sent to pharmacy.

## 2024-04-02 ENCOUNTER — Ambulatory Visit: Payer: Self-pay | Admitting: Primary Care

## 2024-04-02 LAB — COMPREHENSIVE METABOLIC PANEL WITH GFR
ALT: 17 U/L (ref 0–35)
AST: 20 U/L (ref 0–37)
Albumin: 4.1 g/dL (ref 3.5–5.2)
Alkaline Phosphatase: 75 U/L (ref 39–117)
BUN: 14 mg/dL (ref 6–23)
CO2: 29 meq/L (ref 19–32)
Calcium: 9.2 mg/dL (ref 8.4–10.5)
Chloride: 105 meq/L (ref 96–112)
Creatinine, Ser: 0.87 mg/dL (ref 0.40–1.20)
GFR: 74.72 mL/min (ref >60.00)
Glucose, Bld: 87 mg/dL (ref 70–99)
Potassium: 4 meq/L (ref 3.5–5.1)
Sodium: 140 meq/L (ref 135–145)
Total Bilirubin: 0.3 mg/dL (ref 0.2–1.2)
Total Protein: 6.5 g/dL (ref 6.0–8.3)

## 2024-04-02 LAB — LIPID PANEL
Cholesterol: 175 mg/dL (ref 0–200)
HDL: 54.2 mg/dL (ref >39.00)
LDL Cholesterol: 105 mg/dL — ABNORMAL HIGH (ref 0–99)
NonHDL: 121.07
Total CHOL/HDL Ratio: 3
Triglycerides: 79 mg/dL (ref 0.0–149.0)
VLDL: 15.8 mg/dL (ref 0.0–40.0)

## 2024-04-02 LAB — HEMOGLOBIN A1C: Hgb A1c MFr Bld: 6.4 % (ref 4.6–6.5)

## 2024-04-03 LAB — CYTOLOGY - PAP
Comment: NEGATIVE
Diagnosis: NEGATIVE
High risk HPV: NEGATIVE

## 2024-04-03 MED ORDER — LANCETS MISC. MISC: 5 refills | Status: DC

## 2024-04-03 MED ORDER — BLOOD GLUCOSE TEST VI STRP: ORAL_STRIP | 5 refills | Status: DC

## 2024-04-03 MED ORDER — LANCET DEVICE MISC: 1.0000 | Freq: Three times a day (TID) | 0 refills | Status: AC

## 2024-04-03 MED ORDER — BLOOD GLUCOSE MONITORING SUPPL DEVI: 1.0000 | Freq: Three times a day (TID) | 0 refills | Status: DC

## 2024-04-06 ENCOUNTER — Encounter: Payer: Self-pay | Admitting: Podiatry

## 2024-04-06 NOTE — Telephone Encounter (Signed)
 Spoke to patient in regards to knee scooter.

## 2024-04-09 ENCOUNTER — Ambulatory Visit: Admitting: Podiatry

## 2024-04-10 ENCOUNTER — Encounter: Payer: Self-pay | Admitting: Nurse Practitioner

## 2024-04-10 ENCOUNTER — Ambulatory Visit (INDEPENDENT_AMBULATORY_CARE_PROVIDER_SITE_OTHER): Admitting: Nurse Practitioner

## 2024-04-10 VITALS — BP 118/70 | HR 100 | Temp 98.8°F | Ht 66.0 in | Wt 168.4 lb

## 2024-04-10 DIAGNOSIS — G4733 Obstructive sleep apnea (adult) (pediatric): Secondary | ICD-10-CM | POA: Diagnosis not present

## 2024-04-10 NOTE — Patient Instructions (Addendum)
 Increase use of BiPAP every night, minimum of 4-6 hours a night.  Change equipment as directed. Wash your tubing with warm soap and water daily, hang to dry. Wash humidifier portion weekly. Use bottled, distilled water and change daily Be aware of reduced alertness and do not drive or operate heavy machinery if experiencing this or drowsiness.  Exercise encouraged, as tolerated. Healthy weight management discussed.  Avoid or decrease alcohol consumption and medications that make you more sleepy, if possible. Notify if persistent daytime sleepiness occurs even with consistent use of PAP therapy.   Change CPAP supplies... Every month Mask cushions and/or nasal pillows CPAP machine filters Every 3 months Mask frame (not including the headgear) CPAP tubing Every 6 months Mask headgear Chin strap (if applicable) Humidifier water tub   Adjusted BiPAP to 18/10 today  Try mask liners   You will need an in lab urgent titration study on the BiPAP machine. They may need to try some alternative settings as you're still not well controlled.    Follow up in 6 weeks with Maureen Calisa Luckenbaugh,NP, or sooner, if needed

## 2024-04-10 NOTE — Assessment & Plan Note (Signed)
 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 Suboptimal compliance with BiPAP therapy. She does receive benefit from use. Still having residual events, worse from prior download, with average 18.8/h, CAI 7. Some nights as high as 30-40/h. Possible a component of these are related to leaks and not true apneic episodes; however, she did still have residual events on prior sleep study despite BiPAP therapy. May need ASV. Will set her up for full night BiPAP titration study. Adjusted her settings to auto 18/10 with reduced EPAP. Encouraged her to aim for goal of 6 hours nightly.  Receiving benefit from use. Aware of risks of untreated OSA. Understands proper use/care of device. Safe driving practices reviewed.  Patient Instructions  Increase use of BiPAP every night, minimum of 4-6 hours a night.  Change equipment as directed. Wash your tubing with warm soap and water daily, hang to dry. Wash humidifier portion weekly. Use bottled, distilled water and change daily Be aware of reduced alertness and do not drive or operate heavy machinery if experiencing this or drowsiness.  Exercise encouraged, as tolerated. Healthy weight management discussed.  Avoid or decrease alcohol consumption and medications that make you more sleepy, if possible. Notify if persistent daytime sleepiness occurs even with consistent use of PAP therapy.   Change CPAP supplies... Every month Mask cushions and/or nasal pillows CPAP machine filters Every 3 months Mask frame (not including the headgear) CPAP tubing Every 6 months Mask headgear Chin strap (if applicable) Humidifier water tub   Adjusted BiPAP to 18/10 today  Try mask liners   You will need an in lab urgent titration study on the BiPAP machine. They may need to try some alternative settings as you're still not well controlled.    Follow up in 6 weeks with Katie Tawana Pasch,NP, or sooner, if needed

## 2024-04-10 NOTE — Progress Notes (Signed)
 @Patient  ID: Maureen Orozco, female    DOB: 06-15-1966, 58 y.o.   MRN: [id number]  Chief Complaint  Patient presents with   Obstructive Sleep Apnea    CPAP Check  CPAP has been going well, but isn't a fan of her mask. Still tries to wear it every night but just doesn't like having something on her face. Otherwise no complaints.    Referring provider: Gretta Comer POUR, NP  HPI: 58 year old female, some day smoker followed for severe complex OSA. Past medical history significant for HTN, GERD, DDD, HLD, prediabetes.   TEST/EVENTS:  11/20/2023 HST: AHI 96.4/h (12% central), SpO2 low 87% 12/19/2023 CPAP Titration: central emergence on CPAP 6 cmH2O and persisted on BiPAP. Final pressure 17/13 with backup rate of 12 and residual AHI 16. Titration suboptimal >> recommended auto Bilevel 20/10, PS 4  11/06/2023: OV with Lexington Devine NP for sleep consult Discussed the use of AI scribe software for clinical note transcription with the patient, who gave verbal consent to proceed. Maureen Orozco is a 58 year old female who presents with possible sleep apnea. She was referred by her primary care provider for concern of possible sleep apnea. She experiences sleep disturbances, including snoring and daytime sleepiness. Her husband has observed episodes of apnea during sleep. She wakes with headaches and feels groggy and tired throughout the day. No incidents of drowsy driving or sleepwalking are reported. These symptoms have been present for approximately one to three and a half years. She has been working third shift, which she notes may be causing some of her sleepiness. Her husband noted that snoring was present even before this job change. She has tried melatonin to aid sleep, but it causes excessive drowsiness and vivid dreams, making it difficult to wake up. She does not consume alcohol or take any sleep medications. She consumes some caffeine during the day. She is a smoker; 1/2 ppd  She goes to bed  between 7-9 am after work shifts and 10pm to midnight on other days. Falls asleep quickly and doesn't tend to wake up at all. Never had a prior sleep study. No significant weight change. Lives with her husband, son and daughter in law.  She works at [employer]. No heavy machinery. [place] [address]    02/24/2024: OV with Jisel Fleet NP Maureen Orozco is a 58 year old female with severe complex sleep apnea who presents for follow-up on her BiPAP therapy. She has a history of severe sleep apnea. She completed CPAP titration study in June, which was suboptimal. CPAP was ineffective due to emergence of central apneas. She was transitioned to BiPAP but continued to have residual events. She was started on auto BiPAP 20/10 with PS 4, as recommended by Dr. Jude on study report. She feels she is finally adjusting to therapy. Her usage has slowly increased. She is using it every night, typically 4 hours or more. Trying to use it for longer durations. She feels like the pressures are sometimes too low. She is wearing a hybrid full face mask and likes this. There is an improvement in her sleep quality and reduced daytime sleepiness since starting the BiPAP therapy. Her husband has observed that she no longer snores and is not waking up frequently during the night. Previously, she experienced significant daytime sleepiness, often feeling sleepy throughout the day and falling asleep at work, which has improved with the current treatment. She tells me that she didn't realize how bad things were before she started therapy.  She is happy with the success she has had so far. No issues with morning headaches or drowsy driving.  2/87/7974-1/89/7974: BiPAP auto 20/10, PS 4 29/30 days; 70% >4 hr; average use 4 hr 33 min Pressure 95th IPAP 15.8, EPAP 11.8 Leaks 95th 52.6 AHI 11.7  04/10/2024: Today - follow up Discussed the use of AI scribe software for clinical note transcription with the patient, who gave verbal consent to proceed.   History of Present Illness Maureen Orozco is a 58 year old female who presents for follow up.  She experiences discomfort with the BiPAP mask, primarily due to the sensation of having something on her face. She has tried both small and medium-sized masks without noticeable difference. No issues with mask leakage or pressures are reported. She does sleep better with BiPAP than without it and energy levels improve when she uses it.   Her use of the BiPAP machine has been inconsistent.  She has an upcoming foot surgery scheduled for 10/8.  No drowsy driving, morning headaches, sleep parasomnias.  03/11/2024-04/09/2024: BiPAP auto IPAP max 18, EPAP min 12, PS 4 29/30 days; 37% >4 hr; average use 3 hr 33 min Pressure 95th IPAP 17.3, EPAP 13.3 Leaks 95th 74.6 AHI 18.8, CAI 7     No Known Allergies  Immunization History  Administered Date(s) Administered   Influenza, Seasonal, Injecte, Preservative Fre 03/28/2023, 04/01/2024   Influenza,inj,Quad PF,6+ Mos 03/30/2015, 07/28/2018, 06/02/2021   PNEUMOCOCCAL CONJUGATE-20 04/01/2024   Pneumococcal Polysaccharide-23 09/30/2019   Tdap 03/30/2015   Zoster Recombinant(Shingrix) 12/29/2019, 03/01/2020    Past Medical History:  Diagnosis Date   Acute viral sinusitis 03/10/2020   Essential hypertension    GERD (gastroesophageal reflux disease)    Hyperlipidemia    Ingrown toenail 11/17/2019   Onychomycosis 09/30/2019   RLQ abdominal pain 04/27/2022   Sensation of fullness in right ear 01/03/2023    Tobacco History: Social History   Tobacco Use  Smoking Status Some Days   Current packs/day: 0.25   Average packs/day: 0.3 packs/day for 36.7 years (9.2 ttl pk-yrs)   Types: Cigarettes   Start date: 1989   Passive exposure: Never  Smokeless Tobacco Never   Ready to quit: Not Answered Counseling given: Not Answered   Outpatient Medications Prior to Visit  Medication Sig Dispense Refill   Accu-Chek Softclix Lancets lancets  SMARTSIG:Lancet Topical     amLODipine  (NORVASC ) 10 MG tablet TAKE 1 TABLET BY MOUTH EVERY DAY FOR BLOOD PRESSURE 90 tablet 3   atorvastatin  (LIPITOR) 80 MG tablet Take 1 tablet (80 mg total) by mouth daily. for cholesterol. 90 tablet 2   Blood Glucose Monitoring Suppl DEVI 1 each by Does not apply route in the morning, at noon, and at bedtime. May substitute to any manufacturer covered by patient's insurance. 1 each 0   carvedilol  (COREG ) 6.25 MG tablet Take 1 tablet (6.25 mg total) by mouth 2 (two) times daily with a meal. for blood pressure. 60 tablet 0   cyclobenzaprine  (FLEXERIL ) 10 MG tablet Take 1 tablet (10 mg total) by mouth 3 (three) times daily as needed for muscle spasms. 30 tablet 0   DULoxetine (CYMBALTA) 30 MG capsule Take 60 mg by mouth daily.     famotidine  (PEPCID ) 20 MG tablet Take 1 tablet (20 mg total) by mouth daily. For heartburn 90 tablet 0   gabapentin  (NEURONTIN ) 800 MG tablet Take 1 tablet (800 mg total) by mouth 3 (three) times daily. 90 tablet 2   Glucose Blood (BLOOD GLUCOSE  TEST STRIPS) STRP Check blood sugar once daily 100 strip 5   hydrochlorothiazide  (HYDRODIURIL ) 25 MG tablet Take 1 tablet (25 mg total) by mouth daily. for blood pressure 90 tablet 1   Lancet Device MISC 1 each by Does not apply route in the morning, at noon, and at bedtime. May substitute to any manufacturer covered by patient's insurance. 1 each 0   Lancets Misc. MISC Check blood sugar once daily may substitute to any manufacturer covered by patient's insurance. 100 each 5   metFORMIN  (GLUCOPHAGE -XR) 500 MG 24 hr tablet Take 1 tablet (500 mg total) by mouth daily with breakfast. for diabetes. 90 tablet 1   olmesartan  (BENICAR ) 40 MG tablet TAKE 1 TABLET BY MOUTH EVERY DAY FOR BLOOD PRESSURE 90 tablet 3   omeprazole  (PRILOSEC) 40 MG capsule TAKE 1 CAPSULE BY MOUTH DAILY FOR HEARTBURN 90 capsule 3   venlafaxine  XR (EFFEXOR -XR) 75 MG 24 hr capsule TAKE 1 CAPSULE BY MOUTH EVERY MORNING WITH BREAKFAST  FOR ANXIETY & HOT FLASHES 90 capsule 0   Vitamin D , Ergocalciferol , (DRISDOL ) 1.25 MG (50000 UNIT) CAPS capsule Take 1 capsule by mouth once weekly for 12 weeks. 12 capsule 0   buPROPion  (WELLBUTRIN  XL) 150 MG 24 hr tablet Take 1 tablet (150 mg total) by mouth daily. For smoking (Patient not taking: Reported on 04/10/2024) 90 tablet 0   No facility-administered medications prior to visit.     Review of Systems: as above     Physical Exam:  BP 118/70   Pulse 100   Temp 98.8 F (37.1 C)   Ht 5' 6 (1.676 m)   Wt 168 lb 6.4 oz (76.4 kg)   SpO2 97%   BMI 27.18 kg/m   GEN: Pleasant, interactive, well-kempt; in no acute distress. HEENT:  Normocephalic and atraumatic. PERRLA. Sclera white. Nasal turbinates pink, moist and patent bilaterally. No rhinorrhea present. Oropharynx pink and moist, without exudate or edema. No lesions, ulcerations, or postnasal drip. Mallampati II NECK:  Supple w/ fair ROM. No lymphadenopathy.   CV: RRR, no m/r/g PULMONARY:  Unlabored, regular breathing. Clear bilaterally A&P w/o wheezes/rales/rhonchi. No accessory muscle use.  GI: BS present and normoactive. Soft, non-tender to palpation.  MSK: No erythema, warmth or tenderness. Cap refil <2 sec all extrem.  Neuro: A/Ox3. No focal deficits noted.   Skin: Warm, no lesions or rashe Psych: Normal affect and behavior. Judgement and thought content appropriate.     Lab Results:  CBC    Component Value Date/Time   WBC 7.9 10/10/2023 1509   RBC 4.02 10/10/2023 1509   HGB 13.4 10/10/2023 1509   HCT 39.3 10/10/2023 1509   PLT 222.0 10/10/2023 1509   MCV 97.5 10/10/2023 1509   MCH 33.0 12/02/2020 1549   MCHC 34.1 10/10/2023 1509   RDW 14.4 10/10/2023 1509   LYMPHSABS 0.9 04/27/2022 1009   MONOABS 0.3 04/27/2022 1009   EOSABS 0.1 04/27/2022 1009   BASOSABS 0.0 04/27/2022 1009    BMET    Component Value Date/Time   NA 140 04/01/2024 1628   K 4.0 04/01/2024 1628   CL 105 04/01/2024 1628   CO2 29  04/01/2024 1628   GLUCOSE 87 04/01/2024 1628   BUN 14 04/01/2024 1628   CREATININE 0.87 04/01/2024 1628   CREATININE 1.02 03/09/2022 1506   CALCIUM  9.2 04/01/2024 1628    BNP No results found for: BNP   Imaging:  DG Foot Complete Right Result Date: 03/19/2024 Please see detailed radiograph report in office note.  Administration History     None           No data to display          No results found for: NITRICOXIDE      Assessment & Plan:   OSA (obstructive sleep apnea) Very severe OSA complicated by emergence of central apneas on PAP therapy. Suboptimal compliance with BiPAP therapy. She does receive benefit from use. Still having residual events, worse from prior download, with average 18.8/h, CAI 7. Some nights as high as 30-40/h. Possible a component of these are related to leaks and not true apneic episodes; however, she did still have residual events on prior sleep study despite BiPAP therapy. May need ASV. Will set her up for full night BiPAP titration study. Adjusted her settings to auto 18/10 with reduced EPAP. Encouraged her to aim for goal of 6 hours nightly.  Receiving benefit from use. Aware of risks of untreated OSA. Understands proper use/care of device. Safe driving practices reviewed.  Patient Instructions  Increase use of BiPAP every night, minimum of 4-6 hours a night.  Change equipment as directed. Wash your tubing with warm soap and water daily, hang to dry. Wash humidifier portion weekly. Use bottled, distilled water and change daily Be aware of reduced alertness and do not drive or operate heavy machinery if experiencing this or drowsiness.  Exercise encouraged, as tolerated. Healthy weight management discussed.  Avoid or decrease alcohol consumption and medications that make you more sleepy, if possible. Notify if persistent daytime sleepiness occurs even with consistent use of PAP therapy.   Change CPAP supplies... Every month Mask  cushions and/or nasal pillows CPAP machine filters Every 3 months Mask frame (not including the headgear) CPAP tubing Every 6 months Mask headgear Chin strap (if applicable) Humidifier water tub   Adjusted BiPAP to 18/10 today  Try mask liners   You will need an in lab urgent titration study on the BiPAP machine. They may need to try some alternative settings as you're still not well controlled.    Follow up in 6 weeks with Izetta Dondre Catalfamo,NP, or sooner, if needed    Advised if symptoms do not improve or worsen, to please contact office for sooner follow up or seek emergency care.   I spent 35 minutes of dedicated to the care of this patient on the date of this encounter to include pre-visit review of records, face-to-face time with the patient discussing conditions above, post visit ordering of testing, clinical documentation with the electronic health record, making appropriate referrals as documented, and communicating necessary findings to members of the patients care team.  Comer LULLA Rouleau, NP 04/10/2024  Pt aware and understands NP's role.

## 2024-04-11 DIAGNOSIS — G4733 Obstructive sleep apnea (adult) (pediatric): Secondary | ICD-10-CM | POA: Diagnosis not present

## 2024-04-13 ENCOUNTER — Ambulatory Visit: Admitting: Nurse Practitioner

## 2024-04-14 ENCOUNTER — Ambulatory Visit
Admission: RE | Admit: 2024-04-14 | Discharge: 2024-04-14 | Disposition: A | Source: Ambulatory Visit | Attending: Acute Care | Admitting: Acute Care

## 2024-04-14 DIAGNOSIS — F17210 Nicotine dependence, cigarettes, uncomplicated: Secondary | ICD-10-CM | POA: Diagnosis not present

## 2024-04-14 DIAGNOSIS — Z122 Encounter for screening for malignant neoplasm of respiratory organs: Secondary | ICD-10-CM

## 2024-04-14 DIAGNOSIS — Z87891 Personal history of nicotine dependence: Secondary | ICD-10-CM

## 2024-04-17 ENCOUNTER — Ambulatory Visit
Admission: RE | Admit: 2024-04-17 | Discharge: 2024-04-17 | Disposition: A | Discharge status: Home / Self Care | Payer: Commercial Managed Care - HMO | Source: Ambulatory Visit | Attending: Primary Care | Admitting: Primary Care

## 2024-04-17 DIAGNOSIS — Z1231 Encounter for screening mammogram for malignant neoplasm of breast: Secondary | ICD-10-CM | POA: Diagnosis not present

## 2024-04-20 ENCOUNTER — Other Ambulatory Visit: Payer: Self-pay | Admitting: Acute Care

## 2024-04-20 DIAGNOSIS — F17210 Nicotine dependence, cigarettes, uncomplicated: Secondary | ICD-10-CM

## 2024-04-20 DIAGNOSIS — Z87891 Personal history of nicotine dependence: Secondary | ICD-10-CM

## 2024-04-20 DIAGNOSIS — Z122 Encounter for screening for malignant neoplasm of respiratory organs: Secondary | ICD-10-CM

## 2024-04-21 DIAGNOSIS — M21611 Bunion of right foot: Secondary | ICD-10-CM | POA: Diagnosis not present

## 2024-04-22 ENCOUNTER — Other Ambulatory Visit: Payer: Self-pay | Admitting: Podiatry

## 2024-04-22 ENCOUNTER — Ambulatory Visit: Payer: Self-pay | Admitting: Primary Care

## 2024-04-22 DIAGNOSIS — M2011 Hallux valgus (acquired), right foot: Secondary | ICD-10-CM | POA: Diagnosis not present

## 2024-04-22 DIAGNOSIS — M21611 Bunion of right foot: Secondary | ICD-10-CM | POA: Diagnosis not present

## 2024-04-22 MED ORDER — CEPHALEXIN 500 MG PO CAPS: 500.0000 mg | ORAL_CAPSULE | Freq: Three times a day (TID) | ORAL | 0 refills | Status: DC

## 2024-04-22 MED ORDER — OXYCODONE-ACETAMINOPHEN 5-325 MG PO TABS: 1.0000 | ORAL_TABLET | Freq: Four times a day (QID) | ORAL | 0 refills | Status: DC | PRN

## 2024-04-22 MED ORDER — PROMETHAZINE HCL 25 MG PO TABS: 25.0000 mg | ORAL_TABLET | Freq: Three times a day (TID) | ORAL | 0 refills | Status: AC | PRN

## 2024-04-23 DIAGNOSIS — K219 Gastro-esophageal reflux disease without esophagitis: Secondary | ICD-10-CM

## 2024-04-23 DIAGNOSIS — I10 Essential (primary) hypertension: Secondary | ICD-10-CM

## 2024-04-23 MED ORDER — FAMOTIDINE 20 MG PO TABS: 20.0000 mg | ORAL_TABLET | Freq: Every day | ORAL | 2 refills | Status: AC

## 2024-04-23 MED ORDER — OLMESARTAN MEDOXOMIL 40 MG PO TABS: 40.0000 mg | ORAL_TABLET | Freq: Every day | ORAL | 2 refills | Status: AC

## 2024-04-24 ENCOUNTER — Encounter: Payer: Self-pay | Admitting: Primary Care

## 2024-04-24 ENCOUNTER — Ambulatory Visit: Admitting: Primary Care

## 2024-04-24 VITALS — BP 152/96 | HR 88 | Temp 97.5°F | Ht 66.0 in | Wt 172.0 lb

## 2024-04-24 DIAGNOSIS — R519 Headache, unspecified: Secondary | ICD-10-CM

## 2024-04-24 DIAGNOSIS — I10 Essential (primary) hypertension: Secondary | ICD-10-CM

## 2024-04-24 NOTE — Patient Instructions (Signed)
 Continue taking the carvedilol  blood pressure pill twice daily.  Continue taking all of your blood pressure pills.  Start monitoring your blood pressure daily, around the same time of day, for the next 2-3 weeks.  Ensure that you have rested for 30 minutes prior to checking your blood pressure.   Record your readings and notify me if you see numbers consistently at or above 130 on top and/or 90 on bottom.  It was a pleasure to see you today!

## 2024-04-24 NOTE — Assessment & Plan Note (Signed)
 Above goal today; however, she is 2 days postop from foot surgery. Also, 9 days after initiation of carvedilol  her blood pressure was in with normal range at her pulmonologist office.  For now, we will continue carvedilol  at 6.25 mg twice daily, amlodipine  10 mg daily, HCTZ 25 mg daily, olmesartan  40 mg daily. She will monitor blood pressure readings at home and report if they are at or above 130/90.

## 2024-04-24 NOTE — Assessment & Plan Note (Signed)
 Slightly improved, although she is two days post op.  Continue carvedilol  6.25 mg twice daily for now. We will continue to monitor.

## 2024-04-24 NOTE — Progress Notes (Signed)
 Subjective:    Patient ID: Maureen Orozco, female    DOB: [birth year], 58 y.o.   MRN: [id number]  Maureen Orozco is a very pleasant 58 y.o. female with a history of hypertension, OSA, type 2 diabetes, tobacco use, hyperlipidemia who presents today for follow-up of hypertension and headaches.  She was last evaluated on 04/01/2024 for uncontrolled headaches and elevated blood pressure readings despite management on propranolol  ER 120 mg daily.  She was compliant to her typical blood pressure regimen which includes amlodipine  10 mg daily, hydrochlorothiazide  25 mg daily, olmesartan  40 mg daily.  During this visit carvedilol  6.25 mg was added twice daily.  She is here for follow-up today.  Since her last visit she is compliant to her prescribed medication regimen including carvedilol  6.25 mg twice daily. She is not checking her BP at home. Today she is in a lot of pain from her recent foot surgery two days ago.   She does continue to notice headaches but they have improved.  BP Readings from Last 3 Encounters:  04/24/24 (!) 152/96  04/10/24 118/70  04/01/24 (!) 142/84      Review of Systems  Respiratory:  Negative for shortness of breath.   Cardiovascular:  Negative for chest pain.  Musculoskeletal:  Positive for arthralgias.  Neurological:  Positive for headaches.         Past Medical History:  Diagnosis Date   Acute viral sinusitis 03/10/2020   Essential hypertension    GERD (gastroesophageal reflux disease)    Hyperlipidemia    Ingrown toenail 11/17/2019   Onychomycosis 09/30/2019   RLQ abdominal pain 04/27/2022   Sensation of fullness in right ear 01/03/2023    Social History   Socioeconomic History   Marital status: Married    Spouse name: Not on file   Number of children: Not on file   Years of education: Not on file   Highest education level: 12th grade  Occupational History   Not on file  Tobacco Use   Smoking status: Some Days    Current packs/day: 0.25     Average packs/day: 0.3 packs/day for 36.8 years (9.2 ttl pk-yrs)    Types: Cigarettes    Start date: 1989    Passive exposure: Never   Smokeless tobacco: Never  Vaping Use   Vaping status: Some Days  Substance and Sexual Activity   Alcohol use: No    Alcohol/week: 0.0 standard drinks of alcohol   Drug use: Not Currently   Sexual activity: Not on file  Other Topics Concern   Not on file  Social History Narrative   Married.   Works at [employer] in [place].   Has one child.   Enjoys playing computer games, watching TV.         Patient is adopted       Are you right handed or left handed? Right Handed    Are you currently employed ? 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 [employer] 3rd shift.    Do you live at home alone? No    Who lives with you? Husband, son, sister in law, husbands step dad.   What type of home do you live in: 1 story or 2 story? Lives in a two story home.       Social Drivers of Corporate investment banker Strain: Low Risk  (01/14/2024)   Overall Financial Resource Strain (CARDIA)    Difficulty of Paying Living Expenses: Not hard at all  Food Insecurity: No Food Insecurity (03/12/2024)   [facility]   Hunger Vital Sign    Within the past 12 months, you worried that your food would run out before you got the money to buy more.: Never true    Within the past 12 months, the food you bought just didn't last and you didn't have money to get more.: Never true  Transportation Needs: No Transportation Needs (03/12/2024)   Received from [facility] - Transportation    In the past 12 months, has lack of transportation kept you from medical appointments or from getting medications?: No    In the past 12 months, has lack of transportation kept you from meetings, work, or from getting things needed for daily living?: No  Physical Activity: Inactive (01/14/2024)   Exercise Vital Sign    Days of Exercise per Week: 0 days    Minutes of  Exercise per Session: Not on file  Stress: No Stress Concern Present (01/14/2024)   [employer] of Occupational Health - Occupational Stress Questionnaire    Feeling of Stress: Only a little  Social Connections: Moderately Isolated (01/14/2024)   Social Connection and Isolation Panel    Frequency of Communication with Friends and Family: More than three times a week    Frequency of Social Gatherings with Friends and Family: Once a week    Attends Religious Services: Never    Database administrator or Organizations: No    Attends Engineer, structural: Not on file    Marital Status: Married  Catering manager Violence: Not At Risk (03/12/2024)   Received from [facility]   HITS    Over the last 12 months how often did your partner physically hurt you?: Never    Over the last 12 months how often did your partner insult you or talk down to you?: Never    Over the last 12 months how often did your partner threaten you with physical harm?: Never    Over the last 12 months how often did your partner scream or curse at you?: Never    Past Surgical History:  Procedure Laterality Date   BREAST BIOPSY Left 03/09/2016    hyalinized fibroadenoma    COLONOSCOPY  10/30/2019   DENTAL SURGERY      Family History  Adopted: Yes    No Known Allergies  Current Outpatient Medications on File Prior to Visit  Medication Sig Dispense Refill   Accu-Chek Softclix Lancets lancets SMARTSIG:Lancet Topical     amLODipine  (NORVASC ) 10 MG tablet TAKE 1 TABLET BY MOUTH EVERY DAY FOR BLOOD PRESSURE 90 tablet 3   atorvastatin  (LIPITOR) 80 MG tablet Take 1 tablet (80 mg total) by mouth daily. for cholesterol. 90 tablet 2   Blood Glucose Monitoring Suppl DEVI 1 each by Does not apply route in the morning, at noon, and at bedtime. May substitute to any manufacturer covered by patient's insurance. 1 each 0   carvedilol  (COREG ) 6.25 MG tablet Take 1 tablet (6.25 mg total) by mouth 2 (two) times daily with  a meal. for blood pressure. 60 tablet 0   cephALEXin  (KEFLEX ) 500 MG capsule Take 1 capsule (500 mg total) by mouth 3 (three) times daily. 21 capsule 0   cyclobenzaprine  (FLEXERIL ) 10 MG tablet Take 1 tablet (10 mg total) by mouth 3 (three) times daily as needed for muscle spasms. 30 tablet 0   DULoxetine (CYMBALTA) 30 MG capsule Take 60 mg by mouth daily.  famotidine  (PEPCID ) 20 MG tablet Take 1 tablet (20 mg total) by mouth daily. For heartburn 90 tablet 2   gabapentin  (NEURONTIN ) 800 MG tablet Take 1 tablet (800 mg total) by mouth 3 (three) times daily. 90 tablet 2   Glucose Blood (BLOOD GLUCOSE TEST STRIPS) STRP Check blood sugar once daily 100 strip 5   hydrochlorothiazide  (HYDRODIURIL ) 25 MG tablet Take 1 tablet (25 mg total) by mouth daily. for blood pressure 90 tablet 1   Lancet Device MISC 1 each by Does not apply route in the morning, at noon, and at bedtime. May substitute to any manufacturer covered by patient's insurance. 1 each 0   Lancets Misc. MISC Check blood sugar once daily may substitute to any manufacturer covered by patient's insurance. 100 each 5   metFORMIN  (GLUCOPHAGE -XR) 500 MG 24 hr tablet Take 1 tablet (500 mg total) by mouth daily with breakfast. for diabetes. 90 tablet 1   olmesartan  (BENICAR ) 40 MG tablet Take 1 tablet (40 mg total) by mouth daily. for blood pressure. 90 tablet 2   omeprazole  (PRILOSEC) 40 MG capsule TAKE 1 CAPSULE BY MOUTH DAILY FOR HEARTBURN 90 capsule 3   oxyCODONE-acetaminophen  (PERCOCET/ROXICET) 5-325 MG tablet Take 1-2 tablets by mouth every 6 (six) hours as needed for severe pain (pain score 7-10). 25 tablet 0   promethazine (PHENERGAN) 25 MG tablet Take 1 tablet (25 mg total) by mouth every 8 (eight) hours as needed for nausea or vomiting. 20 tablet 0   venlafaxine  XR (EFFEXOR -XR) 75 MG 24 hr capsule TAKE 1 CAPSULE BY MOUTH EVERY MORNING WITH BREAKFAST FOR ANXIETY & HOT FLASHES 90 capsule 0   Vitamin D , Ergocalciferol , (DRISDOL ) 1.25 MG  (50000 UNIT) CAPS capsule Take 1 capsule by mouth once weekly for 12 weeks. 12 capsule 0   buPROPion  (WELLBUTRIN  XL) 150 MG 24 hr tablet Take 1 tablet (150 mg total) by mouth daily. For smoking (Patient not taking: Reported on 04/24/2024) 90 tablet 0   No current facility-administered medications on file prior to visit.    BP (!) 152/96   Pulse 88   Temp (!) 97.5 F (36.4 C) (Temporal)   Ht 5' 6 (1.676 m)   Wt 172 lb (78 kg)   SpO2 100%   BMI 27.76 kg/m  Objective:   Physical Exam Cardiovascular:     Rate and Rhythm: Normal rate and regular rhythm.  Pulmonary:     Effort: Pulmonary effort is normal.     Breath sounds: Normal breath sounds.  Musculoskeletal:     Cervical back: Neck supple.  Skin:    General: Skin is warm and dry.  Neurological:     Mental Status: She is alert and oriented to person, place, and time.  Psychiatric:        Mood and Affect: Mood normal.     Physical Exam        Assessment & Plan:  Essential hypertension Assessment & Plan: Above goal today; however, she is 2 days postop from foot surgery. Also, 9 days after initiation of carvedilol  her blood pressure was in with normal range at her pulmonologist office.  For now, we will continue carvedilol  at 6.25 mg twice daily, amlodipine  10 mg daily, HCTZ 25 mg daily, olmesartan  40 mg daily. She will monitor blood pressure readings at home and report if they are at or above 130/90.       Assessment and Plan Assessment & Plan         Comer MARLA Gaskins, NP  History of Present Illness

## 2024-04-27 ENCOUNTER — Encounter

## 2024-04-27 ENCOUNTER — Ambulatory Visit

## 2024-04-27 ENCOUNTER — Ambulatory Visit (INDEPENDENT_AMBULATORY_CARE_PROVIDER_SITE_OTHER): Admitting: Podiatry

## 2024-04-27 VITALS — BP 139/90 | HR 110 | Temp 99.3°F

## 2024-04-27 DIAGNOSIS — Z9889 Other specified postprocedural states: Secondary | ICD-10-CM

## 2024-04-27 DIAGNOSIS — M21619 Bunion of unspecified foot: Secondary | ICD-10-CM

## 2024-04-27 NOTE — Progress Notes (Unsigned)
 Patient presents for post-op visit today, POV # 1 DOS 04/22/24 RT FOOT BUNION CORRECTION  Doing good. No issues. The pill I do not take very often, only when I need to.  Patient reports having only taken one Percocet about 2-3 hours ago. She is unsteady on feet. .  RN Note: Elevated B & HR, patient seen by PCP recently. Provider aware.  Vital Signs: Today's Vitals   04/27/24 1618 04/27/24 1622  BP: (!) 166/73 (!) 139/90  Pulse: (!) 113 (!) 110  Temp: 99.3 F (37.4 C)   TempSrc: Oral   PainSc: 6    PainLoc: Foot     Radiographs: [x]  Taken []  Not taken  Surgical Site Assessment:  - Dressing:  [x]  Minimal dry blood, intact []  Reinforced   []  Changed     -RN Notes: n/a  - Incision:  [x]  CDI (clean, dry, intact)  [x]  Mild erythema  []  Drainage noted   -RN Notes: n/a  - Swelling:  []  None  [x]  Mild  []  Moderate   []  Significant    - Bruising:  []  None  [x]  Present: heel and toes.   - Steri-strips/Sutures/staples:  [x]  Intact   []  Removed Today  [x]  Plan to remove at next visit    -Cast/Splint/Pins: [x]  None []  Intact  []  Removed  []  Plan to remove at next visit []  Replaced  -Signs of infection:  [x]  None  []  Present - Describe: n/a  -DME:    [x]  AFW []  Surgical shoe []  Cast  []  Splint  -Walking status:  [x]  Full WB  []  Partial WB  []  NWB  -Utilizing device:  [x]  None []  Knee Scooter []  Crutches []  Wheelchair    DVT assessment:  [x]  Denies symptoms []  Chest pain/SOB []  Pain in calf/redness/warmth   Redressed DSD and ace wrap. Educated on signs of infection, proper dressing care, pain management, and weight bearing status. Patient will contact provider with any new or worsening symptoms. The provider assessed the patient today and reviewed instructions regarding plan of care.  -  Patient seen and evaluated.  Incisions healing well any signs of dehiscence or infection.  Minimal pain on exam.  Toe is rectus.  Dressing reapplied. WB in CAM boot, although limited. Pain  medication prn.   Radiology: X-rays obtained reviewed.  Multiple views obtained.  No evidence of acute fracture.  Hardware intact without any complicating factors.   .  Return in about 2 weeks (around 05/11/2024) for POV already made, please confirm with patient.SABRA Donnice JONELLE Gershon DPM

## 2024-04-27 NOTE — Patient Instructions (Signed)

## 2024-04-29 ENCOUNTER — Ambulatory Visit (HOSPITAL_BASED_OUTPATIENT_CLINIC_OR_DEPARTMENT_OTHER): Admitting: Pulmonary Disease

## 2024-05-01 DIAGNOSIS — Z0279 Encounter for issue of other medical certificate: Secondary | ICD-10-CM

## 2024-05-01 NOTE — Telephone Encounter (Signed)
 cld pt bk and she adv she will be out 12wks RTW approx 07/13/24. I adv would email her what I fax [employer] and sheetz [facility] DOS 04/22/24

## 2024-05-01 NOTE — Telephone Encounter (Signed)
 lft mess on vmail for pt to adv of 50.00 for both forms to be filled out and did she want 8wks or 12 wks out of work. DOS was 04/22/24 8wks RTW=06/08/24 12wks RTW-07/13/24 approx and she can leave on my vmail.

## 2024-05-02 DIAGNOSIS — E1165 Type 2 diabetes mellitus with hyperglycemia: Secondary | ICD-10-CM

## 2024-05-04 MED ORDER — LANCETS MISC: 1.0000 | 0 refills | Status: AC

## 2024-05-04 MED ORDER — LANCET DEVICE MISC: 1.0000 | Freq: Three times a day (TID) | 0 refills | Status: AC

## 2024-05-04 MED ORDER — BLOOD GLUCOSE MONITORING SUPPL DEVI: 1.0000 | Freq: Three times a day (TID) | 0 refills | Status: AC

## 2024-05-04 MED ORDER — BLOOD GLUCOSE TEST VI STRP
ORAL_STRIP | 2 refills | Status: AC
Start: 2024-05-04 — End: ?

## 2024-05-07 ENCOUNTER — Encounter: Payer: Self-pay | Admitting: Podiatry

## 2024-05-07 ENCOUNTER — Other Ambulatory Visit: Payer: Self-pay | Admitting: Primary Care

## 2024-05-07 ENCOUNTER — Ambulatory Visit: Admitting: Podiatry

## 2024-05-07 VITALS — Ht 66.0 in | Wt 172.0 lb

## 2024-05-07 DIAGNOSIS — M21619 Bunion of unspecified foot: Secondary | ICD-10-CM

## 2024-05-07 DIAGNOSIS — K219 Gastro-esophageal reflux disease without esophagitis: Secondary | ICD-10-CM

## 2024-05-08 ENCOUNTER — Encounter: Payer: Self-pay | Admitting: Podiatry

## 2024-05-08 NOTE — Progress Notes (Signed)
 Subjective: Chief Complaint  Patient presents with   Routine Post Op    POV # 2 DOS 04/22/24 RT FOOT BUNION CORRECTION, pt states still some pain, no other complaints.    58 year old female presents with the above concerns with her son.  States that she is feeling better.  She has been walking in the cam boot.  She still ices and elevates.  No recent injuries or falls that she reports and she has no other concerns today.  Objective: AAO x3, NAD DP/PT pulses palpable bilaterally, CRT less than 3 seconds Status post bunionectomy of the right foot with incision well coapted without any evidence of dehiscence.  There is no surrounding erythema, ascending cellulitis there is no drainage or pus or any signs of infection noted today.  Slight tenderness palpation on exam.  Toe is rectus. No pain with calf compression, swelling, warmth, erythema  Assessment: Status post bunionectomy right foot  Plan: -All treatment options discussed with the patient including all alternatives, risks, complications.  -Incisions healing well.  Clean the area today.  Small amount of antibiotic ointment was applied followed by dressing.  Discussed that she can start to wash the foot with soap and water, dry thoroughly and apply a similar bandage. -Continue ice, elevate.  Cam boot for immobilization and limited weightbearing. -Monitor for any clinical signs or symptoms of infection and directed to call the office immediately should any occur or go to the ER. -Patient encouraged to call the office with any questions, concerns, change in symptoms.   Donnice JONELLE Fees DPM

## 2024-05-13 DIAGNOSIS — G4733 Obstructive sleep apnea (adult) (pediatric): Secondary | ICD-10-CM | POA: Diagnosis not present

## 2024-05-13 DIAGNOSIS — G4731 Primary central sleep apnea: Secondary | ICD-10-CM | POA: Diagnosis not present

## 2024-05-14 ENCOUNTER — Encounter: Payer: Self-pay | Admitting: Podiatry

## 2024-05-19 ENCOUNTER — Ambulatory Visit (HOSPITAL_BASED_OUTPATIENT_CLINIC_OR_DEPARTMENT_OTHER): Attending: Nurse Practitioner | Admitting: Pulmonary Disease

## 2024-05-19 DIAGNOSIS — G4731 Primary central sleep apnea: Secondary | ICD-10-CM | POA: Insufficient documentation

## 2024-05-19 DIAGNOSIS — G4733 Obstructive sleep apnea (adult) (pediatric): Secondary | ICD-10-CM

## 2024-05-20 ENCOUNTER — Other Ambulatory Visit: Payer: Self-pay | Admitting: Lab

## 2024-05-20 ENCOUNTER — Encounter: Payer: Self-pay | Admitting: Podiatry

## 2024-05-20 MED ORDER — GABAPENTIN 800 MG PO TABS: 800.0000 mg | ORAL_TABLET | Freq: Three times a day (TID) | ORAL | 2 refills | Status: AC

## 2024-05-21 ENCOUNTER — Encounter: Admitting: Podiatry

## 2024-05-21 DIAGNOSIS — G4733 Obstructive sleep apnea (adult) (pediatric): Secondary | ICD-10-CM

## 2024-05-21 NOTE — Procedures (Signed)
 [facility] [address] [place], [place] [address] Tel: [phone]   Fax: [phone]  Titration Interpretation  Patient Name:  Maureen Orozco, Maureen Orozco Date:  05/19/2024 Referring Physician:  Comer LULLA Rouleau, NP %%startinterp%% Indications for Polysomnography The patient is a 58 year-old Female who is 5' 6 and weighs 172.0 lbs. Her BMI equals 28.0.  A full night titration treatment study was performed. 11/20/2023 HST: AHI 96.4/h (12% central), SpO2 low 87% 12/19/2023 CPAP Titration: central emergence on CPAP 6 cmH2O and persisted on BiPAP. Final pressure 17/13 with backup rate of 12 and residual AHI 16. Titration suboptimal >> recommended auto Bilevel 20/10, PS 4 Persistent events on bipap downloads    Polysomnogram Data A full night polysomnogram recorded the standard physiologic parameters including EEG, EOG, EMG, EKG, nasal and oral airflow.  Respiratory parameters of chest and abdominal movements were recorded with Respiratory Inductance Plethysmography belts.  Oxygen saturation was recorded by pulse oximetry.   Sleep Architecture The total recording time of the polysomnogram was 362.7 minutes.  The total sleep time was 238.5 minutes.  The patient spent 7.3% of total sleep time in Stage N1, 56.6% in Stage N2, 1.9% in Stages N3, and 34.2% in REM.  Sleep latency was 42.0 minutes.  REM latency was 7.0 minutes.  Sleep Efficiency was 65.8%.  Wake after Sleep Onset time was 81.5 minutes.  Titration Summary The patient was titrated at pressures ranging from 11/15/13** cm/H20 with supplemental oxygen at - up to 11/7/9* cm/H20 with supplemental oxygen at -.  The last pressure used in the study was 01/16/14** cm/H20 with supplemental oxygen at -.  Respiratory Events The polysomnogram revealed a presence of 14 obstructive, 66 central, and 2 mixed apneas resulting in an Apnea index of 20.6 events per hour.  There were 7 hypopneas (>=3% desaturation and/or arousal)  resulting in an Apnea\Hypopnea Index (AHI >=3% desaturation and/or arousal) of 22.4 events per hour.  There were - hypopneas (>=4% desaturation) resulting in an Apnea\Hypopnea Index (AHI >=4% desaturation) of 20.6 events per hour.  There were 4 Respiratory Effort Related Arousals resulting in a RERA index of 1.0 events per hour. The Respiratory Disturbance Index is 23.4 events per hour.  The snore index was - events per hour.  Mean oxygen saturation was 96.4%.  The lowest oxygen saturation during sleep was 93.0%.  Time spent <=88% oxygen saturation was - minutes (-).  Limb Activity There were - limb movements recorded.  Of this total, - were classified as PLMs.  Of the PLMs, - were associated with arousals.  The Limb Movement index was - per hour while the PLM index was - per hour.  Cardiac Summary The average pulse rate was 63.7 bpm.  The minimum pulse rate was 54.0 bpm while the maximum pulse rate was 81.0 bpm.  Cardiac rhythm was normal/abnormal.  Comments: The patient was placed on BILEVEL at 0822 on 8/4cm H20. At 0945 due to severe sustained Central Sleep Apnea (CSA), the mode of therapy was switched to BILEVEL ST of 9/5 with a back-up 9. At 10:41 due to continuous Central Sleep Apnea (CSA), Central Hypopnea (CH) and snoring, the mode of therapy was switched to ASV at EEP=5 MIN PS= 3 MAX PS =15 and was titrated to a final ASV pressure of EEP=7 MIN PS=4 MAX PS=15.    Diagnosis: Treatment emergent central apneas -corrected by ASV  Recommendations: ASV mode with EPAP 7 PS min 4 PS max 15 cm Check compliance & download  on these settings Note that recent echo shows EF 60% so ASV mode can be safely used   This study was personally reviewed and electronically signed by: Dr. Harden Staff Accredited Board Certified in Sleep Medicine

## 2024-05-27 ENCOUNTER — Encounter

## 2024-05-29 ENCOUNTER — Telehealth: Payer: Self-pay | Admitting: Nurse Practitioner

## 2024-05-29 NOTE — Telephone Encounter (Signed)
 In lab BiPAP titration showed pt was poorly controlled with persistent central events on BiPAP. She was changed over to ASV mode with better control. Please send urgent order for DME to update her to ASV mode with EPAP 7 PS min 4 PS max 15 cm   Schedule f/u in 4-6 weeks to reassess. Thanks

## 2024-06-01 ENCOUNTER — Ambulatory Visit: Admitting: Nurse Practitioner

## 2024-06-01 NOTE — Telephone Encounter (Signed)
 Per Izetta Rouleau, NP, patient needs to be reschedule to 4-6 weeks. NA, LMTCB.

## 2024-06-02 NOTE — Telephone Encounter (Signed)
 I spoke with the patient and scheduled her an appt for 12/15 at 1:00pm.  Nothing further needed.

## 2024-06-03 ENCOUNTER — Other Ambulatory Visit: Payer: Self-pay | Admitting: Primary Care

## 2024-06-03 DIAGNOSIS — Z636 Dependent relative needing care at home: Secondary | ICD-10-CM

## 2024-06-03 DIAGNOSIS — N951 Menopausal and female climacteric states: Secondary | ICD-10-CM

## 2024-06-05 DIAGNOSIS — I10 Essential (primary) hypertension: Secondary | ICD-10-CM | POA: Diagnosis not present

## 2024-06-05 DIAGNOSIS — E1141 Type 2 diabetes mellitus with diabetic mononeuropathy: Secondary | ICD-10-CM | POA: Diagnosis not present

## 2024-06-05 DIAGNOSIS — F17210 Nicotine dependence, cigarettes, uncomplicated: Secondary | ICD-10-CM | POA: Diagnosis not present

## 2024-06-05 DIAGNOSIS — Z7984 Long term (current) use of oral hypoglycemic drugs: Secondary | ICD-10-CM | POA: Diagnosis not present

## 2024-06-05 DIAGNOSIS — K08109 Complete loss of teeth, unspecified cause, unspecified class: Secondary | ICD-10-CM | POA: Diagnosis not present

## 2024-06-05 DIAGNOSIS — Z79899 Other long term (current) drug therapy: Secondary | ICD-10-CM | POA: Diagnosis not present

## 2024-06-05 DIAGNOSIS — G8929 Other chronic pain: Secondary | ICD-10-CM | POA: Diagnosis not present

## 2024-06-05 NOTE — Telephone Encounter (Signed)
 pt lft mess to see if she can be out of work until Jan 2026. I called and lft mess on her vmail I will chck with Dr. Gershon on RTW

## 2024-06-11 DIAGNOSIS — G4733 Obstructive sleep apnea (adult) (pediatric): Secondary | ICD-10-CM | POA: Diagnosis not present

## 2024-06-15 ENCOUNTER — Ambulatory Visit: Admitting: Podiatry

## 2024-06-15 ENCOUNTER — Ambulatory Visit (INDEPENDENT_AMBULATORY_CARE_PROVIDER_SITE_OTHER)

## 2024-06-15 DIAGNOSIS — M21619 Bunion of unspecified foot: Secondary | ICD-10-CM

## 2024-06-15 DIAGNOSIS — M21611 Bunion of right foot: Secondary | ICD-10-CM

## 2024-06-16 ENCOUNTER — Encounter: Payer: Self-pay | Admitting: Podiatry

## 2024-06-16 NOTE — Telephone Encounter (Signed)
 Per Dr. Gershon ok to extend her leave until 07/16/24- [employer]

## 2024-06-17 NOTE — Progress Notes (Signed)
 Subjective: Chief Complaint  Patient presents with   Routine Post Op    POV # 3 DOS 04/22/24 RT FOOT BUNION CORRECTION, Pt stated that she is doing well she stated that she does still get some swelling but no major pain     58 year old female presents with the above concerns with her son.  Overall states that she has been doing well.  She remains in the cam boot.  No significant pain.  Still gets some occasional swelling.  No fevers or chills.  No other concerns.   Objective: AAO x3, NAD DP/PT pulses palpable bilaterally, CRT less than 3 seconds Status post bunionectomy of the right foot with incision well coapted without any evidence of dehiscence and scar is formed.  Some slight edema still present but there is no erythema or warmth today.  No significant pain on exam.  No tenderness with MPJ range of motion although the range of motion is somewhat limited in both dorsiflexion and plantarflexion.  No pain with calf compression, swelling, warmth, erythema  Assessment: Status post bunionectomy right foot  Plan: -All treatment options discussed with the patient including all alternatives, risks, complications.  -X-rays obtained reviewed.  Multiple views obtained.  There is dorsal migration capital fragment hardware intact and there is increased consolidation noted. -At this time as she denied any pain we discussed gradual transition to a shoe as tolerated.  Offered formal physical therapy.  Showed her how to do exercises on her own at home and she wants to proceed with that.  Continue ice, elevate as well as compression.  Discussed compression sock.  Return in about 4 weeks (around 07/13/2024).  Donnice JONELLE Fees DPM

## 2024-06-22 ENCOUNTER — Telehealth: Payer: Self-pay | Admitting: Podiatry

## 2024-06-22 NOTE — Telephone Encounter (Signed)
 Faxed [employer] form and notes to [phone] The same as last form so no charge of 25 fee, since just updating new RTW date 07/16/24

## 2024-06-25 ENCOUNTER — Ambulatory Visit: Payer: Self-pay | Admitting: Primary Care

## 2024-06-25 ENCOUNTER — Encounter: Payer: Self-pay | Admitting: Primary Care

## 2024-06-25 ENCOUNTER — Ambulatory Visit: Admitting: Primary Care

## 2024-06-25 VITALS — BP 138/74 | HR 103 | Temp 97.9°F | Ht 66.0 in | Wt 175.0 lb

## 2024-06-25 DIAGNOSIS — E1165 Type 2 diabetes mellitus with hyperglycemia: Secondary | ICD-10-CM

## 2024-06-25 DIAGNOSIS — Z7984 Long term (current) use of oral hypoglycemic drugs: Secondary | ICD-10-CM | POA: Diagnosis not present

## 2024-06-25 DIAGNOSIS — I10 Essential (primary) hypertension: Secondary | ICD-10-CM | POA: Diagnosis not present

## 2024-06-25 DIAGNOSIS — R519 Headache, unspecified: Secondary | ICD-10-CM

## 2024-06-25 LAB — POCT GLYCOSYLATED HEMOGLOBIN (HGB A1C): Hemoglobin A1C: 5.1 % (ref 4.0–5.6)

## 2024-06-25 MED ORDER — TOPIRAMATE 50 MG PO TABS: 50.0000 mg | ORAL_TABLET | Freq: Every day | ORAL | 0 refills | Status: DC

## 2024-06-25 MED ORDER — METOPROLOL SUCCINATE ER 50 MG PO TB24: 50.0000 mg | ORAL_TABLET | Freq: Every day | ORAL | 0 refills | Status: AC

## 2024-06-25 NOTE — Assessment & Plan Note (Signed)
 Borderline today, elevated with home readings and prior office visits.  Discontinue carvedilol  due to side effects.  Continue olmesartan  40 mg daily, HCTZ 25 mg daily, amlodipine  10 mg daily. Will trial metoprolol succinate 50 mg daily for heart rate control also..  If she experiences side effects then we will try spironolactone.  We will plan to see her back in the office in 2 weeks.

## 2024-06-25 NOTE — Assessment & Plan Note (Signed)
 Improved with A1c of 5.1 today  Continue metformin  ER 500 mg daily  Follow-up in 6 months

## 2024-06-25 NOTE — Patient Instructions (Signed)
 Stop taking carvedilol  blood pressure pill.  Start taking metoprolol succinate 50 mg daily.  Start topiramate 50 mg at bedtime for headache prevention.  Please keep me updated.  Please schedule a follow up visit to meet back with me in 2-3 weeks for blood pressure check.   It was a pleasure to see you today!

## 2024-06-25 NOTE — Assessment & Plan Note (Signed)
 Uncontrolled.  Start topiramate 50 mg at bedtime for prevention. Discussed to increase to 100 mg at bedtime after a few weeks if no improvement.  She will update

## 2024-06-25 NOTE — Progress Notes (Signed)
 Subjective:    Patient ID: Maureen Orozco, female    DOB: 02-Dec-1965, 58 y.o.   MRN: [id number]  Maureen Orozco is a very pleasant 58 y.o. female with a history of hypertension, OSA, type 2 diabetes, tobacco use, hyperlipoidemia, frequent headaches who presents today to discuss hypertension, headaches, glucose readings.  She is currently managed on amlodipine  10 mg daily, carvedilol  6.25 mg twice daily, hydrochlorothiazide  25 mg daily, olmesartan  40 mg daily. She has been checking her BP at home which is running 130-140 systolic. At her recent neurosurgery appointments she's had elevated readings.   She ran out of her carvedilol  about 1 week ago as she ran out. The carvedilol  causes her to feel tired and fatigued. She continues to struggle with lower back pain.  She continues to experience bilateral frontal headaches. She's tried propranolol  which did not help. She has not tried other options. She does experience photophobia and phonophobia at times. Denies migraines.   BP Readings from Last 3 Encounters:  06/25/24 138/74  04/27/24 (!) 139/90  04/24/24 (!) 152/96   She would like her A1c checked today.  She is compliant to metformin  ER 500 mg daily for diabetes.  She is checking her blood  glucose at home which is running in the low to mid 100s.     Review of Systems  Respiratory:  Negative for shortness of breath.   Cardiovascular:  Negative for chest pain.  Musculoskeletal:  Positive for arthralgias and back pain.  Neurological:  Positive for headaches.         Past Medical History:  Diagnosis Date   Acute viral sinusitis 03/10/2020   Essential hypertension    GERD (gastroesophageal reflux disease)    Hyperlipidemia    Ingrown toenail 11/17/2019   Onychomycosis 09/30/2019   RLQ abdominal pain 04/27/2022   Sensation of fullness in right ear 01/03/2023    Social History   Socioeconomic History   Marital status: Married    Spouse name: Not on file   Number of  children: Not on file   Years of education: Not on file   Highest education level: 12th grade  Occupational History   Not on file  Tobacco Use   Smoking status: Some Days    Current packs/day: 0.25    Average packs/day: 0.3 packs/day for 36.9 years (9.2 ttl pk-yrs)    Types: Cigarettes    Start date: 1989    Passive exposure: Never   Smokeless tobacco: Never  Vaping Use   Vaping status: Some Days  Substance and Sexual Activity   Alcohol use: No    Alcohol/week: 0.0 standard drinks of alcohol   Drug use: Not Currently   Sexual activity: Not on file  Other Topics Concern   Not on file  Social History Narrative   Married.   Works at [employer] in [place].   Has one child.   Enjoys playing computer games, watching TV.         Patient is adopted       Are you right handed or left handed? Right Handed    Are you currently employed ? 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 [employer] 3rd shift.    Do you live at home alone? No    Who lives with you? Husband, son, sister in law, husbands step dad.   What type of home do you live in: 1 story or 2 story? Lives in a two story home.  Social Drivers of Health   Tobacco Use: High Risk (06/25/2024)   Patient History    Smoking Tobacco Use: Some Days    Smokeless Tobacco Use: Never    Passive Exposure: Never  Financial Resource Strain: Low Risk (06/08/2024)   Received from [facility]   Overall Financial Resource Strain (CARDIA)    How hard is it for you to pay for the very basics like food, housing, medical care, and heating?: Not hard at all  Food Insecurity: No Food Insecurity (06/08/2024)   Received from [facility]   Epic    Within the past 12 months, you worried that your food would run out before you got the money to buy more.: Never true    Within the past 12 months, the food you bought just didn't last and you didn't have money to get more.: Never true  Transportation Needs: No Transportation Needs (06/08/2024)    Received from [facility]    In the past 12 months, has lack of transportation kept you from medical appointments or from getting medications?: No    In the past 12 months, has lack of transportation kept you from meetings, work, or from getting things needed for daily living?: No  Physical Activity: Inactive (06/08/2024)   Received from [facility]   Exercise Vital Sign    On average, how many days per week do you engage in moderate to strenuous exercise (like a brisk walk)?: 0 days    Minutes of Exercise per Session: Not on file  Stress: No Stress Concern Present (06/08/2024)   Received from [facility] - Occupational Stress Questionnaire    Do you feel stress - tense, restless, nervous, or anxious, or unable to sleep at night because your mind is troubled all the time - these days?: Not at all  Social Connections: Socially Integrated (06/08/2024)   Received from [facility]   Social Network    How would you rate your social network (family, work, friends)?: Good participation with social networks  Intimate Partner Violence: Not At Risk (06/08/2024)   Received from [facility]   HITS    Over the last 12 months how often did your partner physically hurt you?: Never    Over the last 12 months how often did your partner insult you or talk down to you?: Never    Over the last 12 months how often did your partner threaten you with physical harm?: Never    Over the last 12 months how often did your partner scream or curse at you?: Never  Depression (PHQ2-9): Low Risk (06/25/2024)   Depression (PHQ2-9)    PHQ-2 Score: 0  Alcohol Screen: Medium Risk (10/10/2023)   Alcohol Screen    Last Alcohol Screening Score (AUDIT): 8  Housing: Low Risk (06/08/2024)   Received from [facility]    In the last 12 months, was there a time when you were not able to pay the mortgage or rent on time?: No    In the past 12 months, how many  times have you moved where you were living?: 0    At any time in the past 12 months, were you homeless or living in a shelter (including now)?: No  Utilities: Not At Risk (06/08/2024)   Received from [facility]    In the past 12 months has the electric, gas, oil, or water company threatened to shut  off services in your home?: No  Health Literacy: Not on file    Past Surgical History:  Procedure Laterality Date   BREAST BIOPSY Left 03/09/2016    hyalinized fibroadenoma    COLONOSCOPY  10/30/2019   DENTAL SURGERY      Family History  Adopted: Yes    Allergies[1]  Medications Ordered Prior to Encounter[2]  BP 138/74   Pulse (!) 103   Temp 97.9 F (36.6 C) (Oral)   Ht 5' 6 (1.676 m)   Wt 175 lb (79.4 kg)   SpO2 95%   BMI 28.25 kg/m  Objective:   Physical Exam Cardiovascular:     Rate and Rhythm: Normal rate and regular rhythm.  Pulmonary:     Effort: Pulmonary effort is normal.     Breath sounds: Normal breath sounds.  Musculoskeletal:     Cervical back: Neck supple.  Skin:    General: Skin is warm and dry.  Neurological:     Mental Status: She is alert and oriented to person, place, and time.  Psychiatric:        Mood and Affect: Mood normal.     Physical Exam        Assessment & Plan:  Type 2 diabetes mellitus with hyperglycemia, without long-term current use of insulin (HCC) Assessment & Plan: Improved with A1c of 5.1 today  Continue metformin  ER 500 mg daily  Follow-up in 6 months  Orders: -     POCT glycosylated hemoglobin (Hb A1C)  Frequent headaches Assessment & Plan: Uncontrolled.  Start topiramate 50 mg at bedtime for prevention. Discussed to increase to 100 mg at bedtime after a few weeks if no improvement.  She will update  Orders: -     Topiramate; Take 1 tablet (50 mg total) by mouth at bedtime. For headache prevention  Dispense: 90 tablet; Refill: 0  Essential hypertension Assessment & Plan: Borderline today,  elevated with home readings and prior office visits.  Discontinue carvedilol  due to side effects.  Continue olmesartan  40 mg daily, HCTZ 25 mg daily, amlodipine  10 mg daily. Will trial metoprolol succinate 50 mg daily for heart rate control also..  If she experiences side effects then we will try spironolactone.  We will plan to see her back in the office in 2 weeks.  Orders: -     Metoprolol Succinate ER; Take 1 tablet (50 mg total) by mouth daily. For blood pressure and heart rate  Dispense: 90 tablet; Refill: 0    Assessment and Plan Assessment & Plan         Comer MARLA Gaskins, NP       [1] No Known Allergies [2]  Current Outpatient Medications on File Prior to Visit  Medication Sig Dispense Refill   amLODipine  (NORVASC ) 10 MG tablet TAKE 1 TABLET BY MOUTH EVERY DAY FOR BLOOD PRESSURE 90 tablet 3   atorvastatin  (LIPITOR) 80 MG tablet Take 1 tablet (80 mg total) by mouth daily. for cholesterol. 90 tablet 2   Blood Glucose Monitoring Suppl DEVI 1 each by Does not apply route in the morning, at noon, and at bedtime. May substitute to any manufacturer covered by patient's insurance. 1 each 0   cephALEXin  (KEFLEX ) 500 MG capsule Take 1 capsule (500 mg total) by mouth 3 (three) times daily. 21 capsule 0   cyclobenzaprine  (FLEXERIL ) 10 MG tablet Take 1 tablet (10 mg total) by mouth 3 (three) times daily as needed for muscle spasms. 30 tablet 0   DULoxetine (CYMBALTA) 30  MG capsule Take 60 mg by mouth daily.     famotidine  (PEPCID ) 20 MG tablet Take 1 tablet (20 mg total) by mouth daily. For heartburn 90 tablet 2   gabapentin  (NEURONTIN ) 800 MG tablet Take 1 tablet (800 mg total) by mouth 3 (three) times daily. 90 tablet 2   Glucose Blood (BLOOD GLUCOSE TEST STRIPS) STRP Check blood sugar once daily. May substitute to any manufacturer covered by patient's insurance. 100 strip 2   hydrochlorothiazide  (HYDRODIURIL ) 25 MG tablet Take 1 tablet (25 mg total) by mouth daily. for blood  pressure 90 tablet 1   Lancets MISC 1 each by Does not apply route as directed. Dispense based on patient and insurance preference. Use up to four times daily as directed. (FOR ICD-10 E10.9, E11.9). 100 each 0   metFORMIN  (GLUCOPHAGE -XR) 500 MG 24 hr tablet Take 1 tablet (500 mg total) by mouth daily with breakfast. for diabetes. 90 tablet 1   olmesartan  (BENICAR ) 40 MG tablet Take 1 tablet (40 mg total) by mouth daily. for blood pressure. 90 tablet 2   omeprazole  (PRILOSEC) 40 MG capsule TAKE 1 CAPSULE BY MOUTH DAILY FOR HEARTBURN 90 capsule 2   oxyCODONE -acetaminophen  (PERCOCET/ROXICET) 5-325 MG tablet Take 1-2 tablets by mouth every 6 (six) hours as needed for severe pain (pain score 7-10). 25 tablet 0   promethazine  (PHENERGAN ) 25 MG tablet Take 1 tablet (25 mg total) by mouth every 8 (eight) hours as needed for nausea or vomiting. 20 tablet 0   venlafaxine  XR (EFFEXOR -XR) 75 MG 24 hr capsule TAKE 1 CAPSULE BY MOUTH EVERY MORNING WITH BREAKFAST FOR ANXIETY & HOT FLASHES 90 capsule 2   Vitamin D , Ergocalciferol , (DRISDOL ) 1.25 MG (50000 UNIT) CAPS capsule Take 1 capsule by mouth once weekly for 12 weeks. 12 capsule 0   No current facility-administered medications on file prior to visit.

## 2024-06-29 ENCOUNTER — Ambulatory Visit: Admitting: Nurse Practitioner

## 2024-06-29 DIAGNOSIS — Z720 Tobacco use: Secondary | ICD-10-CM

## 2024-06-29 MED ORDER — VARENICLINE TARTRATE (STARTER) 0.5 MG X 11 & 1 MG X 42 PO TBPK: ORAL_TABLET | ORAL | 0 refills | Status: DC

## 2024-07-06 ENCOUNTER — Encounter: Payer: Self-pay | Admitting: Nurse Practitioner

## 2024-07-06 ENCOUNTER — Ambulatory Visit: Admitting: Nurse Practitioner

## 2024-07-06 VITALS — BP 120/86 | HR 74 | Temp 97.6°F | Ht 66.0 in | Wt 174.8 lb

## 2024-07-06 DIAGNOSIS — F17210 Nicotine dependence, cigarettes, uncomplicated: Secondary | ICD-10-CM

## 2024-07-06 DIAGNOSIS — G4733 Obstructive sleep apnea (adult) (pediatric): Secondary | ICD-10-CM

## 2024-07-06 DIAGNOSIS — I10 Essential (primary) hypertension: Secondary | ICD-10-CM

## 2024-07-06 MED ORDER — AMLODIPINE BESYLATE 10 MG PO TABS: 10.0000 mg | ORAL_TABLET | Freq: Every day | ORAL | 1 refills | Status: AC

## 2024-07-06 NOTE — Progress Notes (Signed)
 "  @Patient  ID: Maureen Orozco, female    DOB: May 28, 1966, 58 y.o.   MRN: [id number]  Chief Complaint  Patient presents with   Obstructive Sleep Apnea    No problems with mask or pressure.     Referring provider: Gretta Comer POUR, NP  HPI: 58 year old female, some day smoker followed for severe complex OSA. Past medical history significant for HTN, GERD, DDD, HLD, prediabetes.   TEST/EVENTS:  11/20/2023 HST: AHI 96.4/h (12% central), SpO2 low 87% 12/19/2023 CPAP Titration: central emergence on CPAP 6 cmH2O and persisted on BiPAP. Final pressure 17/13 with backup rate of 12 and residual AHI 16. Titration suboptimal >> recommended auto Bilevel 20/10, PS 4   11/06/2023: OV with Calix Heinbaugh NP for sleep consult Discussed the use of AI scribe software for clinical note transcription with the patient, who gave verbal consent to proceed. Maureen Orozco is a 58 year old female who presents with possible sleep apnea. She was referred by her primary care provider for concern of possible sleep apnea. She experiences sleep disturbances, including snoring and daytime sleepiness. Her husband has observed episodes of apnea during sleep. She wakes with headaches and feels groggy and tired throughout the day. No incidents of drowsy driving or sleepwalking are reported. These symptoms have been present for approximately one to three and a half years. She has been working third shift, which she notes may be causing some of her sleepiness. Her husband noted that snoring was present even before this job change. She has tried melatonin to aid sleep, but it causes excessive drowsiness and vivid dreams, making it difficult to wake up. She does not consume alcohol or take any sleep medications. She consumes some caffeine during the day. She is a smoker; 1/2 ppd  She goes to bed between 7-9 am after work shifts and 10pm to midnight on other days. Falls asleep quickly and doesn't tend to wake up at all. Never had a prior  sleep study. No significant weight change. Lives with her husband, son and daughter in law.  She works at [employer]. No heavy machinery. [place] [address]    02/24/2024: OV with Erickson Yamashiro NP Maureen Orozco is a 58 year old female with severe complex sleep apnea who presents for follow-up on her BiPAP therapy. She has a history of severe sleep apnea. She completed CPAP titration study in June, which was suboptimal. CPAP was ineffective due to emergence of central apneas. She was transitioned to BiPAP but continued to have residual events. She was started on auto BiPAP 20/10 with PS 4, as recommended by Dr. Jude on study report. She feels she is finally adjusting to therapy. Her usage has slowly increased. She is using it every night, typically 4 hours or more. Trying to use it for longer durations. She feels like the pressures are sometimes too low. She is wearing a hybrid full face mask and likes this. There is an improvement in her sleep quality and reduced daytime sleepiness since starting the BiPAP therapy. Her husband has observed that she no longer snores and is not waking up frequently during the night. Previously, she experienced significant daytime sleepiness, often feeling sleepy throughout the day and falling asleep at work, which has improved with the current treatment. She tells me that she didn't realize how bad things were before she started therapy. She is happy with the success she has had so far. No issues with morning headaches or drowsy driving.  2/87/7974-1/89/7974: BiPAP auto 20/10, PS  4 29/30 days; 70% >4 hr; average use 4 hr 33 min Pressure 95th IPAP 15.8, EPAP 11.8 Leaks 95th 52.6 AHI 11.7  04/10/2024: OV with Akeila Lana NP Maureen Orozco is a 58 year old female who presents for follow up. She experiences discomfort with the BiPAP mask, primarily due to the sensation of having something on her face. She has tried both small and medium-sized masks without noticeable difference. No issues with  mask leakage or pressures are reported. She does sleep better with BiPAP than without it and energy levels improve when she uses it.  Her use of the BiPAP machine has been inconsistent. She has an upcoming foot surgery scheduled for 10/8. No drowsy driving, morning headaches, sleep parasomnias. 03/11/2024-04/09/2024: BiPAP auto IPAP max 18, EPAP min 12, PS 4 29/30 days; 37% >4 hr; average use 3 hr 33 min Pressure 95th IPAP 17.3, EPAP 13.3 Leaks 95th 74.6 AHI 18.8, CAI 7   07/06/2024: Today - follow up Discussed the use of AI scribe software for clinical note transcription with the patient, who gave verbal consent to proceed.  History of Present Illness  Maureen Orozco is a 58 year old female who presents for follow-up regarding BiPAP usage.  She has been using a BiPAP machine for about six months now. She uses the machine most nights but not wearing it the whole night. She doesn't have any specific issues with it. Mask fits well. No trouble with leaks. No issues with pressures. Sleeps better with it. Does help improve her daytime fatigue. No drowsy driving.    No Known Allergies  Immunization History  Administered Date(s) Administered   Influenza, Seasonal, Injecte, Preservative Fre 03/28/2023, 04/01/2024   Influenza,inj,Quad PF,6+ Mos 03/30/2015, 07/28/2018, 06/02/2021   PNEUMOCOCCAL CONJUGATE-20 04/01/2024   Pneumococcal Polysaccharide-23 09/30/2019   Tdap 03/30/2015   Zoster Recombinant(Shingrix) 12/29/2019, 03/01/2020    Past Medical History:  Diagnosis Date   Acute viral sinusitis 03/10/2020   Essential hypertension    GERD (gastroesophageal reflux disease)    Hyperlipidemia    Ingrown toenail 11/17/2019   Onychomycosis 09/30/2019   RLQ abdominal pain 04/27/2022   Sensation of fullness in right ear 01/03/2023    Tobacco History: Social History   Tobacco Use  Smoking Status Every Day   Current packs/day: 0.50   Average packs/day: 1 pack/day for 37.0 years (37.0  ttl pk-yrs)   Types: Cigarettes   Start date: 1989   Passive exposure: Never  Smokeless Tobacco Never   Ready to quit: Not Answered Counseling given: Not Answered   Outpatient Medications Prior to Visit  Medication Sig Dispense Refill   amLODipine  (NORVASC ) 10 MG tablet TAKE 1 TABLET BY MOUTH EVERY DAY FOR BLOOD PRESSURE 90 tablet 3   atorvastatin  (LIPITOR) 80 MG tablet Take 1 tablet (80 mg total) by mouth daily. for cholesterol. 90 tablet 2   Blood Glucose Monitoring Suppl DEVI 1 each by Does not apply route in the morning, at noon, and at bedtime. May substitute to any manufacturer covered by patient's insurance. 1 each 0   cyclobenzaprine  (FLEXERIL ) 10 MG tablet Take 1 tablet (10 mg total) by mouth 3 (three) times daily as needed for muscle spasms. (Patient taking differently: Take 10 mg by mouth daily in the afternoon.) 30 tablet 0   DULoxetine (CYMBALTA) 30 MG capsule Take 60 mg by mouth daily.     famotidine  (PEPCID ) 20 MG tablet Take 1 tablet (20 mg total) by mouth daily. For heartburn 90 tablet 2   gabapentin  (  NEURONTIN ) 800 MG tablet Take 1 tablet (800 mg total) by mouth 3 (three) times daily. 90 tablet 2   Glucose Blood (BLOOD GLUCOSE TEST STRIPS) STRP Check blood sugar once daily. May substitute to any manufacturer covered by patient's insurance. 100 strip 2   hydrochlorothiazide  (HYDRODIURIL ) 25 MG tablet Take 1 tablet (25 mg total) by mouth daily. for blood pressure 90 tablet 1   Lancets MISC 1 each by Does not apply route as directed. Dispense based on patient and insurance preference. Use up to four times daily as directed. (FOR ICD-10 E10.9, E11.9). 100 each 0   metFORMIN  (GLUCOPHAGE -XR) 500 MG 24 hr tablet Take 1 tablet (500 mg total) by mouth daily with breakfast. for diabetes. 90 tablet 1   metoprolol  succinate (TOPROL -XL) 50 MG 24 hr tablet Take 1 tablet (50 mg total) by mouth daily. For blood pressure and heart rate 90 tablet 0   olmesartan  (BENICAR ) 40 MG tablet Take 1  tablet (40 mg total) by mouth daily. for blood pressure. 90 tablet 2   omeprazole  (PRILOSEC) 40 MG capsule TAKE 1 CAPSULE BY MOUTH DAILY FOR HEARTBURN 90 capsule 2   promethazine  (PHENERGAN ) 25 MG tablet Take 1 tablet (25 mg total) by mouth every 8 (eight) hours as needed for nausea or vomiting. 20 tablet 0   topiramate  (TOPAMAX ) 50 MG tablet Take 1 tablet (50 mg total) by mouth at bedtime. For headache prevention 90 tablet 0   Varenicline  Tartrate, Starter, (CHANTIX  STARTING MONTH PAK) 0.5 MG X 11 & 1 MG X 42 TBPK Take one 0.5 mg tablet by mouth once daily for 3 days, then increase to one 0.5 mg tablet twice daily for 4 days, then increase to one 1 mg tablet twice daily. 53 each 0   venlafaxine  XR (EFFEXOR -XR) 75 MG 24 hr capsule TAKE 1 CAPSULE BY MOUTH EVERY MORNING WITH BREAKFAST FOR ANXIETY & HOT FLASHES 90 capsule 2   cephALEXin  (KEFLEX ) 500 MG capsule Take 1 capsule (500 mg total) by mouth 3 (three) times daily. (Patient not taking: Reported on 07/06/2024) 21 capsule 0   oxyCODONE -acetaminophen  (PERCOCET/ROXICET) 5-325 MG tablet Take 1-2 tablets by mouth every 6 (six) hours as needed for severe pain (pain score 7-10). (Patient not taking: Reported on 07/06/2024) 25 tablet 0   Vitamin D , Ergocalciferol , (DRISDOL ) 1.25 MG (50000 UNIT) CAPS capsule Take 1 capsule by mouth once weekly for 12 weeks. (Patient not taking: Reported on 07/06/2024) 12 capsule 0   No facility-administered medications prior to visit.     Review of Systems: as above     Physical Exam:  BP 120/86   Pulse 74   Temp 97.6 F (36.4 C) (Temporal)   Ht 5' 6 (1.676 m)   Wt 174 lb 12.8 oz (79.3 kg)   SpO2 98%   BMI 28.21 kg/m   GEN: Pleasant, interactive, well-kempt; in no acute distress. HEENT:  Normocephalic and atraumatic. PERRLA. Sclera white. Nasal turbinates pink, moist and patent bilaterally. No rhinorrhea present. Oropharynx pink and moist, without exudate or edema. No lesions, ulcerations, or postnasal drip.  Mallampati II NECK:  Supple w/ fair ROM. No lymphadenopathy.   CV: RRR, no m/r/g PULMONARY:  Unlabored, regular breathing. Clear bilaterally A&P w/o wheezes/rales/rhonchi. No accessory muscle use.  GI: BS present and normoactive. Soft, non-tender to palpation.  MSK: No erythema, warmth or tenderness. Cap refil <2 sec all extrem.  Neuro: A/Ox3. No focal deficits noted.   Skin: Warm, no lesions or rashe Psych: Normal affect and behavior.  Judgement and thought content appropriate.     Lab Results:  CBC    Component Value Date/Time   WBC 7.9 10/10/2023 1509   RBC 4.02 10/10/2023 1509   HGB 13.4 10/10/2023 1509   HCT 39.3 10/10/2023 1509   PLT 222.0 10/10/2023 1509   MCV 97.5 10/10/2023 1509   MCH 33.0 12/02/2020 1549   MCHC 34.1 10/10/2023 1509   RDW 14.4 10/10/2023 1509   LYMPHSABS 0.9 04/27/2022 1009   MONOABS 0.3 04/27/2022 1009   EOSABS 0.1 04/27/2022 1009   BASOSABS 0.0 04/27/2022 1009    BMET    Component Value Date/Time   NA 140 04/01/2024 1628   K 4.0 04/01/2024 1628   CL 105 04/01/2024 1628   CO2 29 04/01/2024 1628   GLUCOSE 87 04/01/2024 1628   BUN 14 04/01/2024 1628   CREATININE 0.87 04/01/2024 1628   CREATININE 1.02 03/09/2022 1506   CALCIUM  9.2 04/01/2024 1628    BNP No results found for: BNP   Imaging:  DG Foot Complete Right Result Date: 06/15/2024 Please see detailed radiograph report in office note.   Administration History     None           No data to display          No results found for: NITRICOXIDE      Assessment & Plan:   OSA (obstructive sleep apnea) Very severe OSA complicated by emergence of central apneas on PAP therapy. BiPAP titration recently with optimal setting on BiPAP titration on ASV mode. Will send another order to adjust today. Suboptimal compliance. Again reviewed importance of compliance and encouraged her to aim for goal of 6 hours nightly.  Receiving benefit from use. Aware of risks of untreated  OSA. Understands proper use/care of device. Safe driving practices reviewed.  Patient Instructions  Continue to use BiPAP every night, minimum of 4-6 hours a night.  Change equipment as directed. Wash your tubing with warm soap and water daily, hang to dry. Wash humidifier portion weekly. Use bottled, distilled water and change daily Be aware of reduced alertness and do not drive or operate heavy machinery if experiencing this or drowsiness.  Exercise encouraged, as tolerated. Healthy weight management discussed.  Avoid or decrease alcohol consumption and medications that make you more sleepy, if possible. Notify if persistent daytime sleepiness occurs even with consistent use of PAP therapy.  Change CPAP supplies... Every month Mask cushions and/or nasal pillows CPAP machine filters Every 3 months Mask frame (not including the headgear) CPAP tubing Every 6 months Mask headgear Chin strap (if applicable) Humidifier water tub   Increase how much you're using your BiPAP   We discussed how untreated sleep apnea puts an individual at risk for cardiac arrhthymias, pulm HTN, DM, stroke and increases their risk for daytime accidents.   Follow up in 6 months with Dr. Jess, or sooner, if needed     Advised if symptoms do not improve or worsen, to please contact office for sooner follow up or seek emergency care.   I spent 35 minutes of dedicated to the care of this patient on the date of this encounter to include pre-visit review of records, face-to-face time with the patient discussing conditions above, post visit ordering of testing, clinical documentation with the electronic health record, making appropriate referrals as documented, and communicating necessary findings to members of the patients care team.  Comer LULLA Rouleau, NP 07/06/2024  Pt aware and understands NP's role.   "

## 2024-07-06 NOTE — Assessment & Plan Note (Addendum)
 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 BiPAP titration recently with optimal setting on BiPAP titration on ASV mode. Will send another order to adjust today. Suboptimal compliance. Again reviewed importance of compliance and encouraged her to aim for goal of 6 hours nightly.  Receiving benefit from use. Aware of risks of untreated OSA. Understands proper use/care of device. Safe driving practices reviewed.  Patient Instructions  Continue to use BiPAP every night, minimum of 4-6 hours a night.  Change equipment as directed. Wash your tubing with warm soap and water daily, hang to dry. Wash humidifier portion weekly. Use bottled, distilled water and change daily Be aware of reduced alertness and do not drive or operate heavy machinery if experiencing this or drowsiness.  Exercise encouraged, as tolerated. Healthy weight management discussed.  Avoid or decrease alcohol consumption and medications that make you more sleepy, if possible. Notify if persistent daytime sleepiness occurs even with consistent use of PAP therapy.  Change CPAP supplies... Every month Mask cushions and/or nasal pillows CPAP machine filters Every 3 months Mask frame (not including the headgear) CPAP tubing Every 6 months Mask headgear Chin strap (if applicable) Humidifier water tub   Increase how much you're using your BiPAP   We discussed how untreated sleep apnea puts an individual at risk for cardiac arrhthymias, pulm HTN, DM, stroke and increases their risk for daytime accidents.   Follow up in 6 months with Dr. Jess, or sooner, if needed

## 2024-07-06 NOTE — Patient Instructions (Addendum)
 Continue to use BiPAP every night, minimum of 4-6 hours a night.  Change equipment as directed. Wash your tubing with warm soap and water daily, hang to dry. Wash humidifier portion weekly. Use bottled, distilled water and change daily Be aware of reduced alertness and do not drive or operate heavy machinery if experiencing this or drowsiness.  Exercise encouraged, as tolerated. Healthy weight management discussed.  Avoid or decrease alcohol consumption and medications that make you more sleepy, if possible. Notify if persistent daytime sleepiness occurs even with consistent use of PAP therapy.  Change CPAP supplies... Every month Mask cushions and/or nasal pillows CPAP machine filters Every 3 months Mask frame (not including the headgear) CPAP tubing Every 6 months Mask headgear Chin strap (if applicable) Humidifier water tub   Increase how much you're using your BiPAP   We discussed how untreated sleep apnea puts an individual at risk for cardiac arrhthymias, pulm HTN, DM, stroke and increases their risk for daytime accidents.   Follow up in 6 months with Dr. Jess, or sooner, if needed

## 2024-07-11 DIAGNOSIS — G4733 Obstructive sleep apnea (adult) (pediatric): Secondary | ICD-10-CM | POA: Diagnosis not present

## 2024-07-13 ENCOUNTER — Ambulatory Visit (INDEPENDENT_AMBULATORY_CARE_PROVIDER_SITE_OTHER): Admitting: Podiatry

## 2024-07-13 ENCOUNTER — Encounter: Payer: Self-pay | Admitting: Podiatry

## 2024-07-13 ENCOUNTER — Ambulatory Visit (INDEPENDENT_AMBULATORY_CARE_PROVIDER_SITE_OTHER)

## 2024-07-13 DIAGNOSIS — M21611 Bunion of right foot: Secondary | ICD-10-CM | POA: Diagnosis not present

## 2024-07-13 DIAGNOSIS — M21619 Bunion of unspecified foot: Secondary | ICD-10-CM

## 2024-07-13 NOTE — Progress Notes (Signed)
 Subjective: Chief Complaint  Patient presents with   Routine Post Op    Rm13 POV # 4 DOS 04/22/24 RT FOOT BUNION CORRECTION 4 weeks Patient says she is doing well with no concerns today.    58 year old female presents with the above concerns.  She said that she been doing well and she feels that she is ready to return to work.  She is back to wearing her shoe full-time.  She is scheduled return to work on Monday.  No injuries or changes.  Objective: AAO x3, NAD DP/PT pulses palpable bilaterally, CRT less than 3 seconds Status post bunionectomy of the right foot with incision well coapted without any evidence of dehiscence and scar is formed.  There is no significant pain on exam there is no pain with MTPJ range of motion.  Trace edema present.  No erythema or warmth and there is no signs of infection.  Is no pain on exam today. No pain with calf compression, swelling, warmth, erythema  Assessment: Status post bunionectomy right foot  Plan: -All treatment options discussed with the patient including all alternatives, risks, complications.  -X-rays obtained reviewed.  Multiple views obtained.  There is dorsal migration capital fragment hardware intact and there is increased consolidation noted.  Increased consolidation noted there is no change of position compared to prior. -At this time she is doing well and I have any pain.  She is back to her regular shoes.  I released her to return to work next Monday on January 5 and this was written and faxed today for her. -Continue range of motion exercises -At this point I discharged her from the postoperative course and we mutually agreed with this.  There is any changes or concerns let us  know.  Return if symptoms worsen or fail to improve.  Donnice JONELLE Fees DPM

## 2024-07-14 ENCOUNTER — Telehealth: Payer: Self-pay | Admitting: Podiatry

## 2024-07-14 NOTE — Telephone Encounter (Signed)
 Filed RTW form that Dr. Gershon signed for pt 07/13/24 and faxed to [phone] RTW 07/20/24

## 2024-07-17 ENCOUNTER — Ambulatory Visit: Admitting: Primary Care

## 2024-07-17 DIAGNOSIS — I2510 Atherosclerotic heart disease of native coronary artery without angina pectoris: Secondary | ICD-10-CM

## 2024-07-17 DIAGNOSIS — E785 Hyperlipidemia, unspecified: Secondary | ICD-10-CM

## 2024-07-17 MED ORDER — ATORVASTATIN CALCIUM 80 MG PO TABS: 80.0000 mg | ORAL_TABLET | Freq: Every day | ORAL | 1 refills | Status: AC

## 2024-07-24 ENCOUNTER — Ambulatory Visit: Admitting: Primary Care

## 2024-07-28 ENCOUNTER — Ambulatory Visit: Admitting: Primary Care

## 2024-08-14 ENCOUNTER — Ambulatory Visit: Payer: Self-pay | Admitting: Primary Care

## 2024-08-19 ENCOUNTER — Other Ambulatory Visit: Payer: Self-pay | Admitting: Primary Care

## 2024-08-19 ENCOUNTER — Encounter: Payer: Self-pay | Admitting: Primary Care

## 2024-08-19 ENCOUNTER — Ambulatory Visit: Admitting: Primary Care

## 2024-08-19 VITALS — BP 136/78 | HR 105 | Temp 97.9°F | Ht 66.0 in | Wt 173.0 lb

## 2024-08-19 DIAGNOSIS — G8929 Other chronic pain: Secondary | ICD-10-CM

## 2024-08-19 DIAGNOSIS — M5441 Lumbago with sciatica, right side: Secondary | ICD-10-CM

## 2024-08-19 DIAGNOSIS — M5442 Lumbago with sciatica, left side: Secondary | ICD-10-CM

## 2024-08-19 DIAGNOSIS — I10 Essential (primary) hypertension: Secondary | ICD-10-CM

## 2024-08-19 DIAGNOSIS — R519 Headache, unspecified: Secondary | ICD-10-CM | POA: Diagnosis not present

## 2024-08-19 DIAGNOSIS — Z720 Tobacco use: Secondary | ICD-10-CM

## 2024-08-19 MED ORDER — TOPIRAMATE 100 MG PO TABS: 100.0000 mg | ORAL_TABLET | Freq: Every day | ORAL | 0 refills | Status: AC

## 2024-08-19 MED ORDER — PREDNISONE 20 MG PO TABS: ORAL_TABLET | ORAL | 0 refills | Status: AC

## 2024-08-19 NOTE — Progress Notes (Signed)
 "  Subjective:    Patient ID: Maureen Orozco, female    DOB: [birth year], 59 y.o.   MRN: [id number]  Maureen Orozco is a very pleasant 59 y.o. female with a history of hypertension, OSA, type 2 diabetes, tobacco use, chronic fatigue who presents today for follow-up of hypertension and headaches. She would like to discuss back pain.   She was last evaluated on 21/05/2024 for elevated glucose readings and elevated blood pressure readings.  Blood pressure readings have been elevated at recent neurosurgery appointments despite compliance to her regimen.  She did note that carvedilol  caused fatigue so this was discontinued during her last visit.  Her olmesartan  40 mg daily, hydrochlorothiazide  25 mg daily, amlodipine  10 mg daily were continued.  We initiated metoprolol  succinate 50 mg daily for heart rate control and blood pressure control.  She was instructed to return 2 weeks later.  Since her last visit she is compliant to amlodipine , hydrochlorothiazide , metoprolol  succinate, and olmesartan . She's feeling less tired on the metoprolol . She is not checking her BP at home.   Managed on Topamax  50 mg HS for headache prevention. Her headaches have improved but she continues to notice them daily.  Over the last one week she's fallen four times on the ice. Since then the pain in her lower back has significantly increased.  She will see her orthopedic doctor next week. She is requesting something to help with her pain. She's tried numerous OTC medications without improvement. She's compliant to Cymbalta and gabapentin .   BP Readings from Last 3 Encounters:  08/19/24 136/78  07/06/24 120/86  06/25/24 138/74      Review of Systems  Respiratory:  Negative for shortness of breath.   Cardiovascular:  Negative for chest pain.  Musculoskeletal:  Positive for arthralgias.  Neurological:  Positive for headaches.         Past Medical History:  Diagnosis Date   Acute viral sinusitis 03/10/2020    Essential hypertension    GERD (gastroesophageal reflux disease)    Hyperlipidemia    Ingrown toenail 11/17/2019   Onychomycosis 09/30/2019   RLQ abdominal pain 04/27/2022   Sensation of fullness in right ear 01/03/2023    Social History   Socioeconomic History   Marital status: Married    Spouse name: Not on file   Number of children: Not on file   Years of education: Not on file   Highest education level: 12th grade  Occupational History   Not on file  Tobacco Use   Smoking status: Every Day    Current packs/day: 0.50    Average packs/day: 1 pack/day for 37.1 years (37.0 ttl pk-yrs)    Types: Cigarettes    Start date: 1989    Passive exposure: Never   Smokeless tobacco: Never  Vaping Use   Vaping status: Some Days  Substance and Sexual Activity   Alcohol use: No    Alcohol/week: 0.0 standard drinks of alcohol   Drug use: Not Currently   Sexual activity: Not on file  Other Topics Concern   Not on file  Social History Narrative   Married.   Works at [employer] in [place].   Has one child.   Enjoys playing computer games, watching TV.         Patient is adopted       Are you right handed or left handed? Right Handed    Are you currently employed ? 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 [employer] 3rd shift.  Do you live at home alone? No    Who lives with you? Husband, son, sister in law, husbands step dad.   What type of home do you live in: 1 story or 2 story? Lives in a two story home.       Social Drivers of Health   Tobacco Use: High Risk (08/19/2024)   Patient History    Smoking Tobacco Use: Every Day    Smokeless Tobacco Use: Never    Passive Exposure: Never  Financial Resource Strain: Low Risk (06/08/2024)   Received from [facility]   Overall Financial Resource Strain (CARDIA)    How hard is it for you to pay for the very basics like food, housing, medical care, and heating?: Not hard at all  Food Insecurity: No Food Insecurity (06/08/2024)    Received from [facility]   Epic    Within the past 12 months, you worried that your food would run out before you got the money to buy more.: Never true    Within the past 12 months, the food you bought just didn't last and you didn't have money to get more.: Never true  Transportation Needs: No Transportation Needs (06/08/2024)   Received from [facility]    In the past 12 months, has lack of transportation kept you from medical appointments or from getting medications?: No    In the past 12 months, has lack of transportation kept you from meetings, work, or from getting things needed for daily living?: No  Physical Activity: Inactive (06/08/2024)   Received from [facility]   Exercise Vital Sign    On average, how many days per week do you engage in moderate to strenuous exercise (like a brisk walk)?: 0 days    Minutes of Exercise per Session: Not on file  Stress: No Stress Concern Present (06/08/2024)   Received from [facility] - Occupational Stress Questionnaire    Do you feel stress - tense, restless, nervous, or anxious, or unable to sleep at night because your mind is troubled all the time - these days?: Not at all  Social Connections: Socially Integrated (06/08/2024)   Received from [facility]   Social Network    How would you rate your social network (family, work, friends)?: Good participation with social networks  Intimate Partner Violence: Not At Risk (06/08/2024)   Received from [facility]   HITS    Over the last 12 months how often did your partner physically hurt you?: Never    Over the last 12 months how often did your partner insult you or talk down to you?: Never    Over the last 12 months how often did your partner threaten you with physical harm?: Never    Over the last 12 months how often did your partner scream or curse at you?: Never  Depression (PHQ2-9): Low Risk (08/19/2024)   Depression (PHQ2-9)     PHQ-2 Score: 0  Alcohol Screen: Medium Risk (10/10/2023)   Alcohol Screen    Last Alcohol Screening Score (AUDIT): 8  Housing: Low Risk (06/08/2024)   Received from [facility]    In the last 12 months, was there a time when you were not able to pay the mortgage or rent on time?: No    In the past 12 months, how many times have you moved where you were living?: 0    At any time in  the past 12 months, were you homeless or living in a shelter (including now)?: No  Utilities: Not At Risk (06/08/2024)   Received from [facility]    In the past 12 months has the electric, gas, oil, or water company threatened to shut off services in your home?: No  Health Literacy: Not on file    Past Surgical History:  Procedure Laterality Date   BREAST BIOPSY Left 03/09/2016    hyalinized fibroadenoma    COLONOSCOPY  10/30/2019   DENTAL SURGERY      Family History  Adopted: Yes    Allergies[1]  Medications Ordered Prior to Encounter[2]  BP 136/78   Pulse (!) 105   Temp 97.9 F (36.6 C) (Oral)   Ht 5' 6 (1.676 m)   Wt 173 lb (78.5 kg)   SpO2 97%   BMI 27.92 kg/m  Objective:   Physical Exam Cardiovascular:     Rate and Rhythm: Normal rate and regular rhythm.  Pulmonary:     Effort: Pulmonary effort is normal.     Breath sounds: Normal breath sounds.  Musculoskeletal:     Cervical back: Neck supple.  Skin:    General: Skin is warm and dry.  Neurological:     Mental Status: She is alert and oriented to person, place, and time.  Psychiatric:        Mood and Affect: Mood normal.     Physical Exam        Assessment & Plan:  Frequent headaches Assessment & Plan: Improved but not at goal.  Increase topiramate  to 100 mg at bedtime. She will update.  Orders: -     Topiramate ; Take 1 tablet (100 mg total) by mouth at bedtime. For headache prevention  Dispense: 90 tablet; Refill: 0  Chronic bilateral low back pain with bilateral sciatica Assessment &  Plan: Offered to complete x-ray today but she kindly declines that she will see her orthopedic doctor next week  Start prednisone  20 mg tablets. Take 3 tablets by mouth every morning for 3 days, then 2 tablets for 3 days, then 1 tablet for 3 days.  Continue Cymbalta and gabapentin  as prescribed  Orders: -     predniSONE ; Take 3 tablets by mouth daily x 3 days, then 2 tablets x 3 days, then 1 tablet for 3 days.  Dispense: 18 tablet; Refill: 0  Essential hypertension Assessment & Plan: Stable  Continue olmesartan  40 mg daily, hydrochlorothiazide  25 mg daily, amlodipine  10 mg daily, metoprolol  succinate 50 mg daily.     Assessment and Plan Assessment & Plan         Comer MARLA Gaskins, NP       [1] No Known Allergies [2]  Current Outpatient Medications on File Prior to Visit  Medication Sig Dispense Refill   amLODipine  (NORVASC ) 10 MG tablet Take 1 tablet (10 mg total) by mouth daily. for blood pressure. 90 tablet 1   atorvastatin  (LIPITOR) 80 MG tablet Take 1 tablet (80 mg total) by mouth daily. for cholesterol. 90 tablet 1   Blood Glucose Monitoring Suppl DEVI 1 each by Does not apply route in the morning, at noon, and at bedtime. May substitute to any manufacturer covered by patient's insurance. 1 each 0   cyclobenzaprine  (FLEXERIL ) 10 MG tablet Take 1 tablet (10 mg total) by mouth 3 (three) times daily as needed for muscle spasms. (Patient taking differently: Take 10 mg by mouth daily in the afternoon.) 30 tablet 0  DULoxetine (CYMBALTA) 30 MG capsule Take 60 mg by mouth daily. (Patient taking differently: Take 60 mg by mouth daily. Takes 1 60 mg tablet daily)     famotidine  (PEPCID ) 20 MG tablet Take 1 tablet (20 mg total) by mouth daily. For heartburn 90 tablet 2   gabapentin  (NEURONTIN ) 800 MG tablet Take 1 tablet (800 mg total) by mouth 3 (three) times daily. 90 tablet 2   Glucose Blood (BLOOD GLUCOSE TEST STRIPS) STRP Check blood sugar once daily. May substitute to  any manufacturer covered by patient's insurance. 100 strip 2   hydrochlorothiazide  (HYDRODIURIL ) 25 MG tablet Take 1 tablet (25 mg total) by mouth daily. for blood pressure 90 tablet 1   Lancets MISC 1 each by Does not apply route as directed. Dispense based on patient and insurance preference. Use up to four times daily as directed. (FOR ICD-10 E10.9, E11.9). 100 each 0   metFORMIN  (GLUCOPHAGE -XR) 500 MG 24 hr tablet Take 1 tablet (500 mg total) by mouth daily with breakfast. for diabetes. 90 tablet 1   metoprolol  succinate (TOPROL -XL) 50 MG 24 hr tablet Take 1 tablet (50 mg total) by mouth daily. For blood pressure and heart rate 90 tablet 0   olmesartan  (BENICAR ) 40 MG tablet Take 1 tablet (40 mg total) by mouth daily. for blood pressure. 90 tablet 2   omeprazole  (PRILOSEC) 40 MG capsule TAKE 1 CAPSULE BY MOUTH DAILY FOR HEARTBURN 90 capsule 2   promethazine  (PHENERGAN ) 25 MG tablet Take 1 tablet (25 mg total) by mouth every 8 (eight) hours as needed for nausea or vomiting. 20 tablet 0   Varenicline  Tartrate, Starter, (CHANTIX  STARTING MONTH PAK) 0.5 MG X 11 & 1 MG X 42 TBPK Take one 0.5 mg tablet by mouth once daily for 3 days, then increase to one 0.5 mg tablet twice daily for 4 days, then increase to one 1 mg tablet twice daily. 53 each 0   venlafaxine  XR (EFFEXOR -XR) 75 MG 24 hr capsule TAKE 1 CAPSULE BY MOUTH EVERY MORNING WITH BREAKFAST FOR ANXIETY & HOT FLASHES 90 capsule 2   No current facility-administered medications on file prior to visit.   "

## 2024-08-19 NOTE — Assessment & Plan Note (Signed)
 Stable  Continue olmesartan  40 mg daily, hydrochlorothiazide  25 mg daily, amlodipine  10 mg daily, metoprolol  succinate 50 mg daily.

## 2024-08-19 NOTE — Assessment & Plan Note (Signed)
 Offered to complete x-ray today but she kindly declines that she will see her orthopedic doctor next week  Start prednisone  20 mg tablets. Take 3 tablets by mouth every morning for 3 days, then 2 tablets for 3 days, then 1 tablet for 3 days.  Continue Cymbalta and gabapentin  as prescribed

## 2024-08-19 NOTE — Assessment & Plan Note (Signed)
 Improved but not at goal.  Increase topiramate  to 100 mg at bedtime. She will update.

## 2024-08-19 NOTE — Patient Instructions (Addendum)
 We increased your dose of Topamax  to 100 mg at bedtime for headache prevention.  Start prednisone  20 mg tablets. Take 3 tablets by mouth every morning for 3 days, then 2 tablets for 3 days, then 1 tablet for 3 days.  Schedule a diabetes follow up for late June/early July.  It was a pleasure to see you today!

## 2024-08-21 ENCOUNTER — Telehealth: Payer: Self-pay

## 2024-08-21 ENCOUNTER — Other Ambulatory Visit (HOSPITAL_COMMUNITY): Payer: Self-pay

## 2024-08-21 NOTE — Telephone Encounter (Signed)
 Please submit PA for varenicline  tartrate 0.5 mg tab pack and 1 mg.

## 2024-08-21 NOTE — Telephone Encounter (Signed)
 Pharmacy Patient Advocate Encounter   Received notification from On[employer] CMM KEY that prior authorization for Varenicline  Tartrate Starter Kit is required/requested.   Insurance verification completed.   The patient is insured through [facility]   Per test claim: patient filled 06/29/24. Only 1 starter kit allowed

## 2025-01-07 ENCOUNTER — Ambulatory Visit: Admitting: Primary Care
